# Patient Record
Sex: Female | Born: 1955
Health system: Southern US, Community
[De-identification: ages and names within clinical notes are randomized; demographics above are authoritative.]

## PROBLEM LIST (undated history)

## (undated) DIAGNOSIS — K219 Gastro-esophageal reflux disease without esophagitis: Secondary | ICD-10-CM

## (undated) DIAGNOSIS — M1710 Unilateral primary osteoarthritis, unspecified knee: Secondary | ICD-10-CM

## (undated) DIAGNOSIS — Z5189 Encounter for other specified aftercare: Secondary | ICD-10-CM

## (undated) DIAGNOSIS — E079 Disorder of thyroid, unspecified: Secondary | ICD-10-CM

## (undated) DIAGNOSIS — I10 Essential (primary) hypertension: Secondary | ICD-10-CM

## (undated) DIAGNOSIS — M179 Osteoarthritis of knee, unspecified: Secondary | ICD-10-CM

## (undated) DIAGNOSIS — R0789 Other chest pain: Secondary | ICD-10-CM

## (undated) HISTORY — PX: TOTAL ABDOMINAL HYSTERECTOMY: SHX209

## (undated) HISTORY — DX: Morbid (severe) obesity due to excess calories: E66.01

## (undated) HISTORY — DX: Unilateral primary osteoarthritis, unspecified knee: M17.10

## (undated) HISTORY — DX: Osteoarthritis of knee, unspecified: M17.9

## (undated) HISTORY — DX: Encounter for other specified aftercare: Z51.89

## (undated) HISTORY — DX: Essential (primary) hypertension: I10

## (undated) HISTORY — PX: LAPAROSCOPIC GASTRIC BANDING: SHX1100

## (undated) HISTORY — DX: Other chest pain: R07.89

## (undated) HISTORY — PX: CHOLECYSTECTOMY: SHX55

## (undated) HISTORY — PX: ROTATOR CUFF REPAIR: SHX139

## (undated) HISTORY — PX: GASTRIC BYPASS: SHX52

## (undated) HISTORY — DX: Disorder of thyroid, unspecified: E07.9

## (undated) HISTORY — DX: Gastro-esophageal reflux disease without esophagitis: K21.9

---

## 1998-05-26 ENCOUNTER — Observation Stay (HOSPITAL_COMMUNITY): Admission: EM | Admit: 1998-05-26 | Discharge: 1998-05-27 | Payer: Self-pay | Admitting: Emergency Medicine

## 2000-09-08 ENCOUNTER — Inpatient Hospital Stay (HOSPITAL_COMMUNITY): Admission: EM | Admit: 2000-09-08 | Discharge: 2000-09-19 | Payer: Self-pay | Admitting: *Deleted

## 2000-09-09 ENCOUNTER — Encounter: Payer: Self-pay | Admitting: Surgery

## 2000-09-10 ENCOUNTER — Encounter: Payer: Self-pay | Admitting: Surgery

## 2000-09-11 ENCOUNTER — Encounter: Payer: Self-pay | Admitting: Surgery

## 2000-09-13 ENCOUNTER — Encounter: Payer: Self-pay | Admitting: Surgery

## 2003-07-30 ENCOUNTER — Observation Stay (HOSPITAL_COMMUNITY): Admission: RE | Admit: 2003-07-30 | Discharge: 2003-07-31 | Payer: Self-pay | Admitting: Specialist

## 2005-08-30 ENCOUNTER — Ambulatory Visit: Payer: Self-pay | Admitting: Internal Medicine

## 2005-09-02 ENCOUNTER — Ambulatory Visit: Payer: Self-pay | Admitting: Internal Medicine

## 2005-09-13 ENCOUNTER — Ambulatory Visit (HOSPITAL_COMMUNITY): Admission: RE | Admit: 2005-09-13 | Discharge: 2005-09-13 | Payer: Self-pay | Admitting: Internal Medicine

## 2005-09-14 ENCOUNTER — Ambulatory Visit: Payer: Self-pay | Admitting: Internal Medicine

## 2005-10-12 ENCOUNTER — Ambulatory Visit: Payer: Self-pay | Admitting: Internal Medicine

## 2005-12-24 ENCOUNTER — Emergency Department (HOSPITAL_COMMUNITY): Admission: EM | Admit: 2005-12-24 | Discharge: 2005-12-24 | Payer: Self-pay | Admitting: Emergency Medicine

## 2005-12-30 ENCOUNTER — Ambulatory Visit (HOSPITAL_COMMUNITY): Admission: RE | Admit: 2005-12-30 | Discharge: 2005-12-30 | Payer: Self-pay | Admitting: Internal Medicine

## 2006-01-04 ENCOUNTER — Ambulatory Visit: Payer: Self-pay | Admitting: Internal Medicine

## 2006-08-10 ENCOUNTER — Ambulatory Visit: Payer: Self-pay | Admitting: Internal Medicine

## 2006-12-09 IMAGING — MG MM DIGITAL SCREENING BILAT
9 of 10 series · 9 of 10 positions shown · non-contrast
Comparison: none

Bilateral CC and MLO view(s) were taken.

SCREENING MAMMOGRAM:
There is a fibrofatty pattern.  No masses or malignant type calcifications are identified.  
Compared with prior studies.

[R CC (1 of 3)]
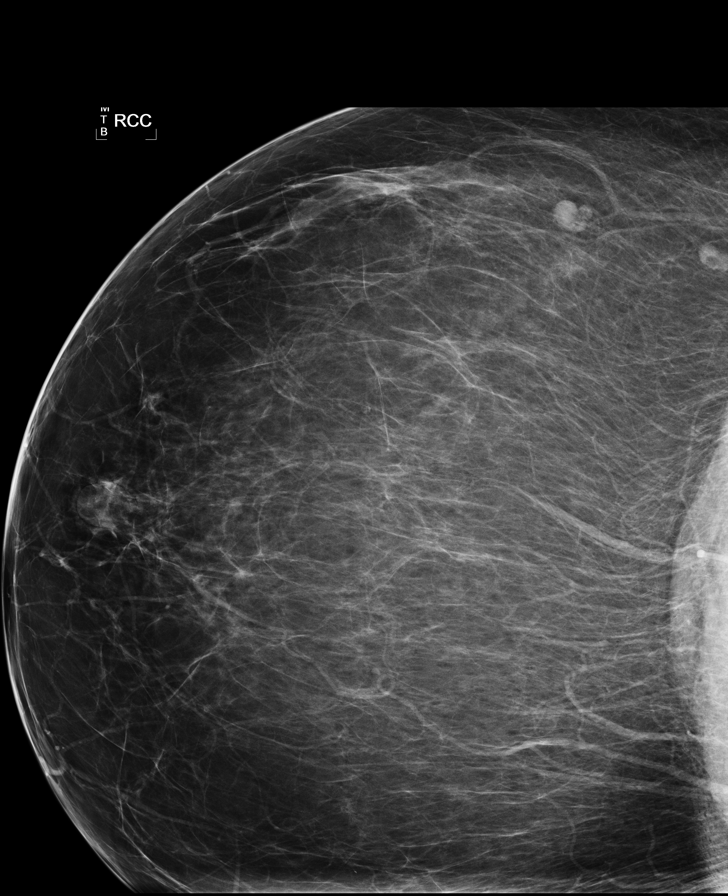

[R MLO]
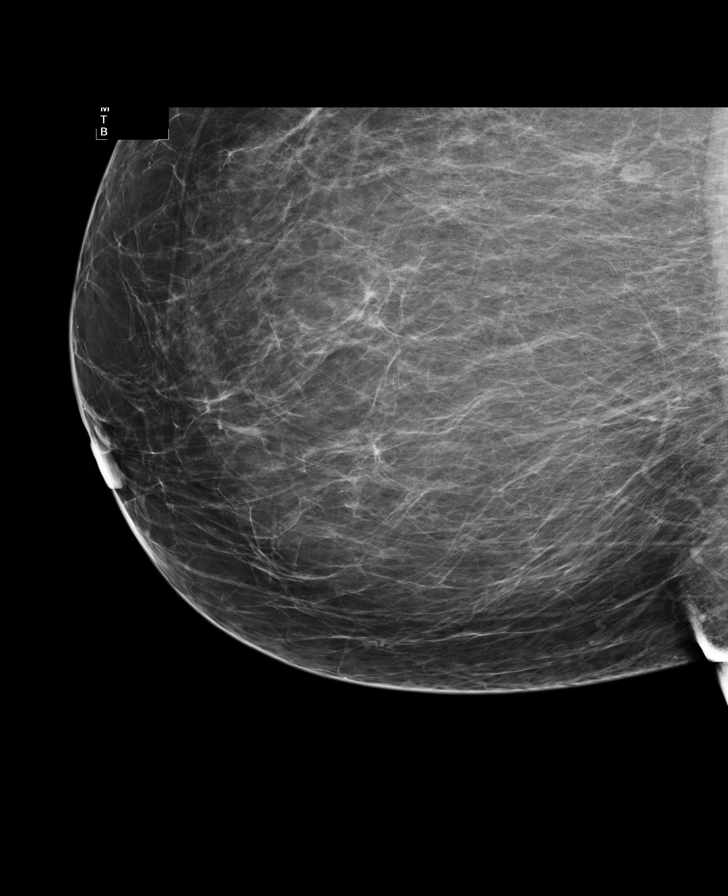

[L CC (1 of 3)]
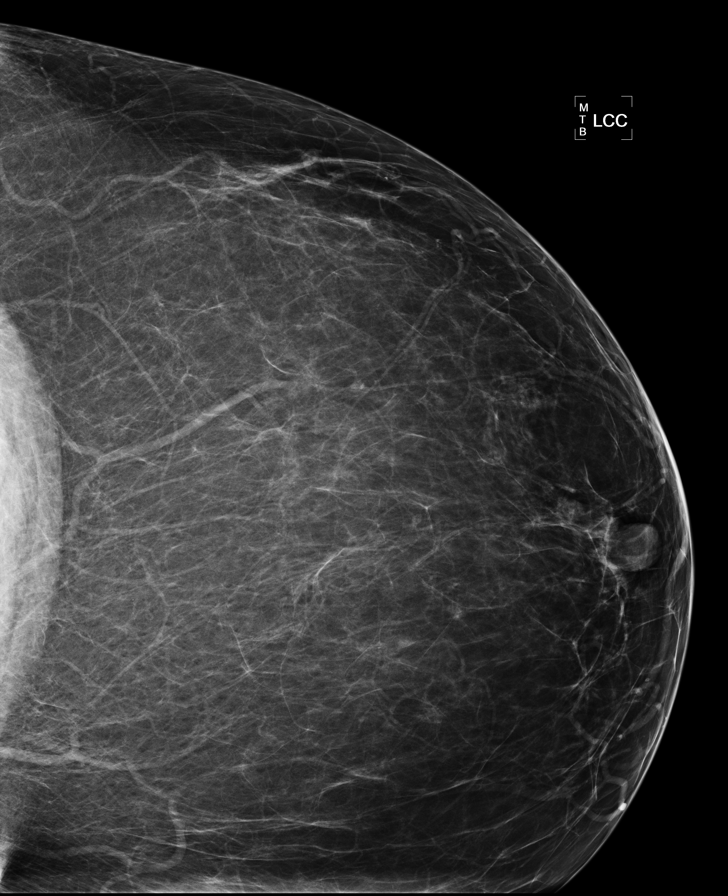

[L MLO (1 of 2)]
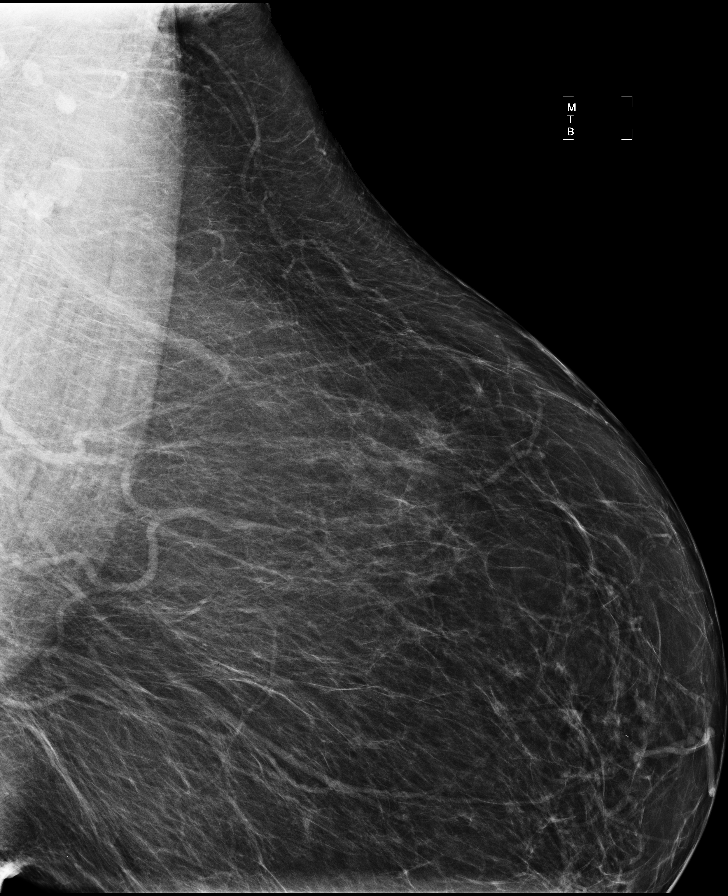

[R CC (2 of 3)]
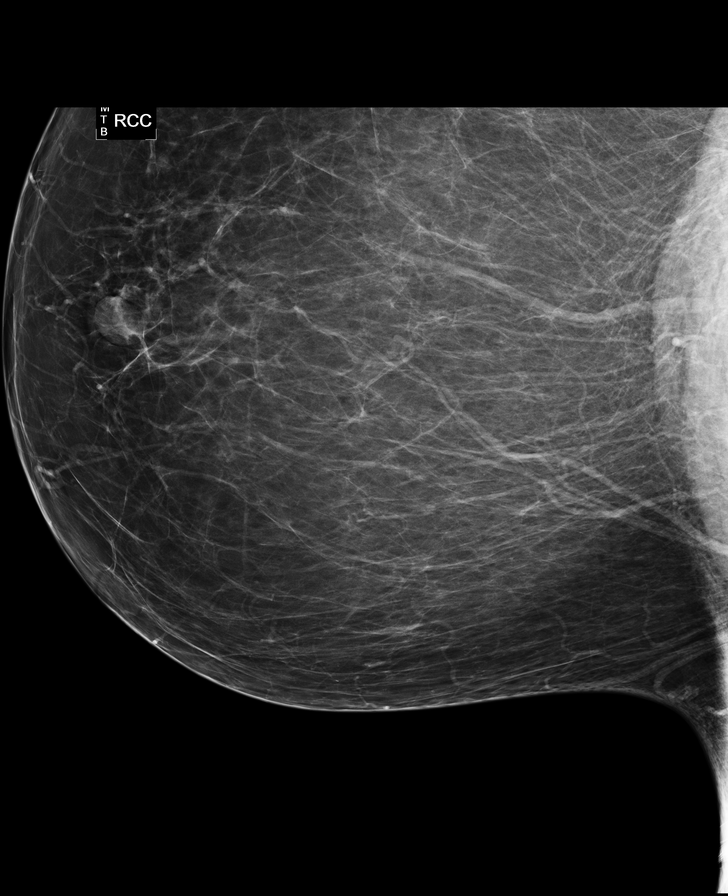

[R CC (3 of 3)]
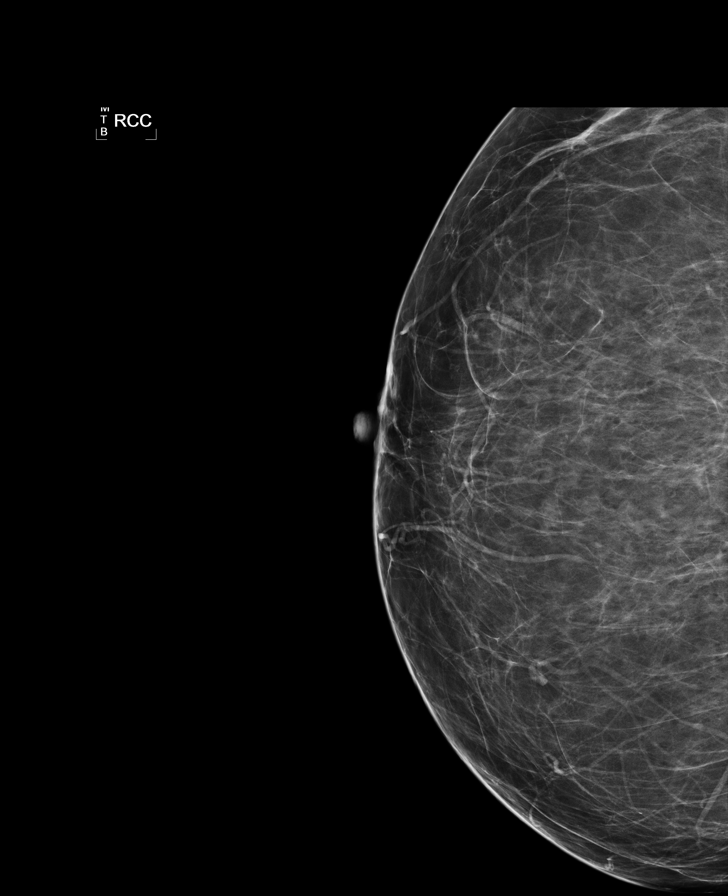

[L CC (2 of 3)]
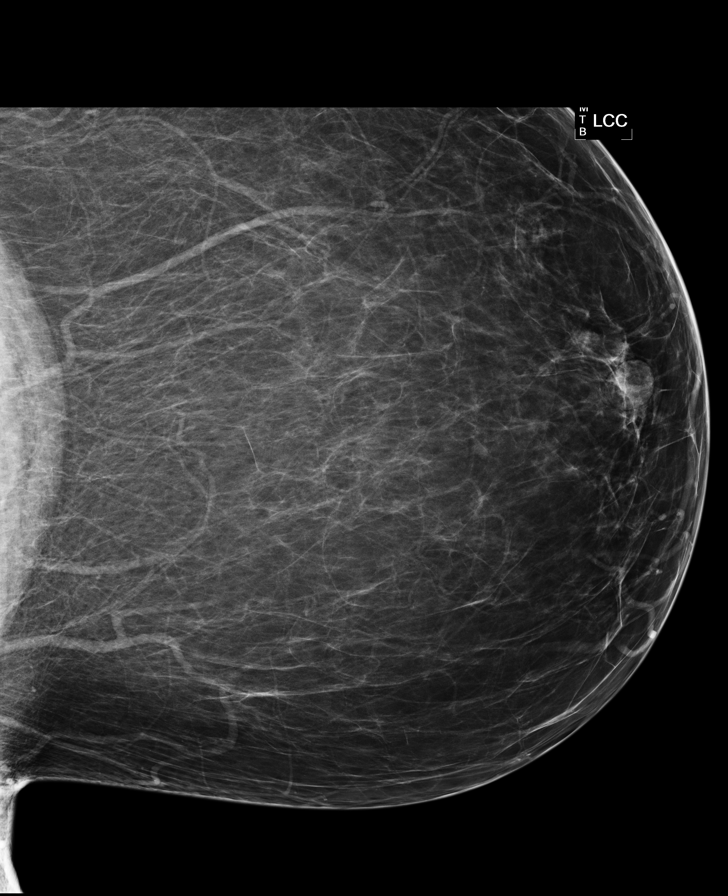

[L CC (3 of 3)]
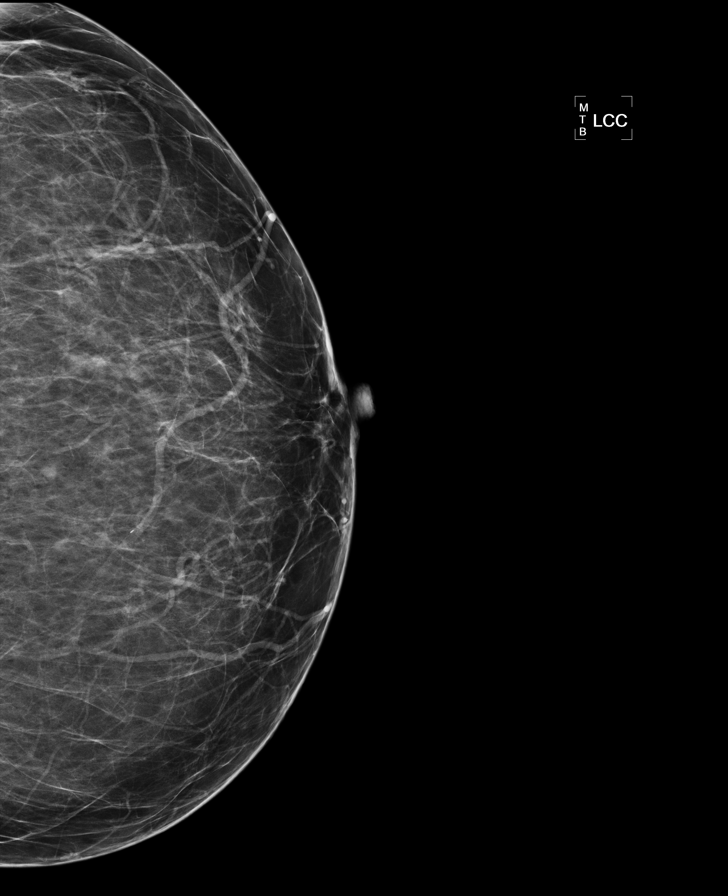

[L MLO (2 of 2)]
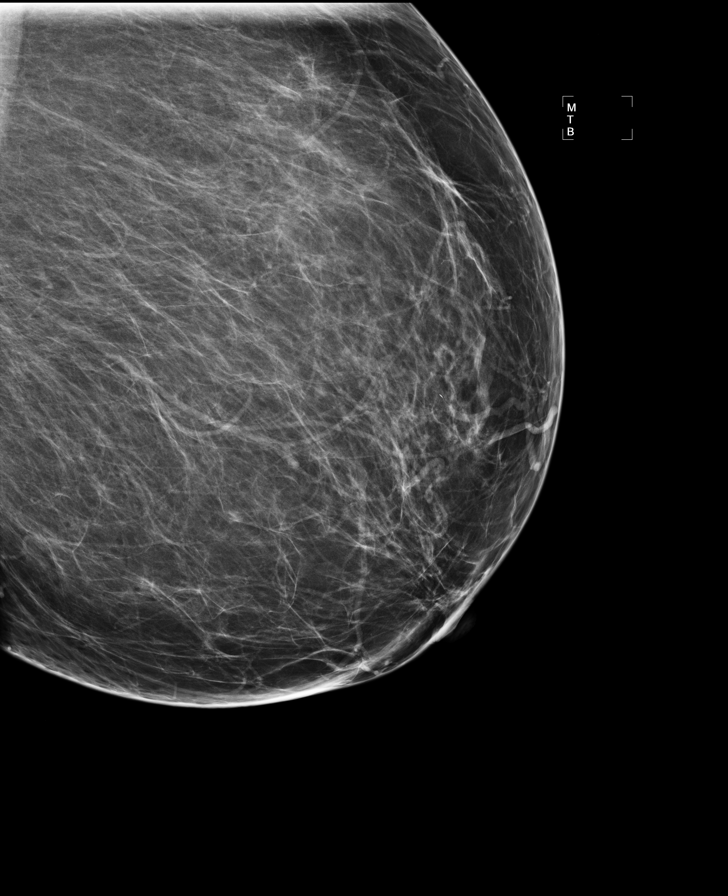

[9 of 10 positions shown; findings below may reference images not displayed]

IMPRESSION: No specific mammographic evidence of malignancy.  Next screening mammogram is recommended in one 
year.

ASSESSMENT: Negative - BI-RADS 1

Screening mammogram in 1 year.

## 2007-07-06 ENCOUNTER — Ambulatory Visit: Payer: Self-pay | Admitting: Gastroenterology

## 2007-07-10 LAB — HM COLONOSCOPY: HM Colonoscopy: ABNORMAL

## 2007-07-20 ENCOUNTER — Ambulatory Visit: Payer: Self-pay | Admitting: Gastroenterology

## 2007-07-20 ENCOUNTER — Encounter: Payer: Self-pay | Admitting: Gastroenterology

## 2007-07-31 LAB — CONVERTED CEMR LAB: Pap Smear: NORMAL

## 2007-08-07 ENCOUNTER — Encounter: Payer: Self-pay | Admitting: *Deleted

## 2007-08-07 DIAGNOSIS — M1710 Unilateral primary osteoarthritis, unspecified knee: Secondary | ICD-10-CM | POA: Insufficient documentation

## 2007-08-07 DIAGNOSIS — M179 Osteoarthritis of knee, unspecified: Secondary | ICD-10-CM | POA: Insufficient documentation

## 2007-08-07 DIAGNOSIS — Z9089 Acquired absence of other organs: Secondary | ICD-10-CM | POA: Insufficient documentation

## 2007-08-07 DIAGNOSIS — Z9079 Acquired absence of other genital organ(s): Secondary | ICD-10-CM | POA: Insufficient documentation

## 2007-08-08 ENCOUNTER — Ambulatory Visit: Payer: Self-pay | Admitting: Internal Medicine

## 2007-08-22 ENCOUNTER — Encounter: Payer: Self-pay | Admitting: Internal Medicine

## 2007-08-22 ENCOUNTER — Ambulatory Visit: Payer: Self-pay

## 2007-09-05 ENCOUNTER — Ambulatory Visit: Payer: Self-pay | Admitting: Internal Medicine

## 2007-09-05 DIAGNOSIS — K219 Gastro-esophageal reflux disease without esophagitis: Secondary | ICD-10-CM | POA: Insufficient documentation

## 2007-09-05 DIAGNOSIS — I10 Essential (primary) hypertension: Secondary | ICD-10-CM | POA: Insufficient documentation

## 2007-09-05 DIAGNOSIS — R7309 Other abnormal glucose: Secondary | ICD-10-CM | POA: Insufficient documentation

## 2007-09-05 DIAGNOSIS — R0789 Other chest pain: Secondary | ICD-10-CM | POA: Insufficient documentation

## 2007-09-07 LAB — CONVERTED CEMR LAB
BUN: 9 mg/dL (ref 6–23)
GFR calc Af Amer: 113 mL/min
GFR calc non Af Amer: 94 mL/min
Glucose, Bld: 85 mg/dL (ref 70–99)

## 2007-09-12 ENCOUNTER — Telehealth: Payer: Self-pay | Admitting: Internal Medicine

## 2007-12-13 ENCOUNTER — Ambulatory Visit: Payer: Self-pay | Admitting: Internal Medicine

## 2007-12-13 DIAGNOSIS — Z96659 Presence of unspecified artificial knee joint: Secondary | ICD-10-CM | POA: Insufficient documentation

## 2007-12-13 DIAGNOSIS — N898 Other specified noninflammatory disorders of vagina: Secondary | ICD-10-CM | POA: Insufficient documentation

## 2007-12-14 ENCOUNTER — Telehealth: Payer: Self-pay | Admitting: Internal Medicine

## 2008-01-25 ENCOUNTER — Encounter: Payer: Self-pay | Admitting: Internal Medicine

## 2008-12-15 ENCOUNTER — Ambulatory Visit: Payer: Self-pay | Admitting: Internal Medicine

## 2008-12-15 LAB — CONVERTED CEMR LAB
ALT: 21 units/L (ref 0–35)
AST: 22 units/L (ref 0–37)
BUN: 18 mg/dL (ref 6–23)
Basophils Relative: 0 % (ref 0.0–3.0)
CO2: 29 meq/L (ref 19–32)
GFR calc non Af Amer: 70 mL/min
Glucose, Bld: 88 mg/dL (ref 70–99)
Hemoglobin: 12.9 g/dL (ref 12.0–15.0)
Hgb A1c MFr Bld: 6 % (ref 4.6–6.0)
MCHC: 34.3 g/dL (ref 30.0–36.0)
MCV: 83.7 fL (ref 78.0–100.0)
Monocytes Absolute: 0.3 10*3/uL (ref 0.1–1.0)
Monocytes Relative: 2.9 % — ABNORMAL LOW (ref 3.0–12.0)
Neutro Abs: 7.7 10*3/uL (ref 1.4–7.7)
Neutrophils Relative %: 77.6 % — ABNORMAL HIGH (ref 43.0–77.0)
Platelets: 269 10*3/uL (ref 150–400)
Sodium: 142 meq/L (ref 135–145)
Total Bilirubin: 0.4 mg/dL (ref 0.3–1.2)
Total Protein: 7.3 g/dL (ref 6.0–8.3)

## 2008-12-17 ENCOUNTER — Encounter: Payer: Self-pay | Admitting: Internal Medicine

## 2009-02-10 ENCOUNTER — Telehealth: Payer: Self-pay | Admitting: Internal Medicine

## 2009-02-11 ENCOUNTER — Telehealth: Payer: Self-pay | Admitting: Internal Medicine

## 2009-08-07 ENCOUNTER — Ambulatory Visit: Payer: Self-pay | Admitting: Internal Medicine

## 2009-08-07 DIAGNOSIS — J069 Acute upper respiratory infection, unspecified: Secondary | ICD-10-CM | POA: Insufficient documentation

## 2009-11-16 ENCOUNTER — Ambulatory Visit: Payer: Self-pay | Admitting: Internal Medicine

## 2009-11-16 ENCOUNTER — Ambulatory Visit: Payer: Self-pay | Admitting: Diagnostic Radiology

## 2009-11-16 ENCOUNTER — Ambulatory Visit (HOSPITAL_BASED_OUTPATIENT_CLINIC_OR_DEPARTMENT_OTHER): Admission: RE | Admit: 2009-11-16 | Discharge: 2009-11-16 | Payer: Self-pay | Admitting: Internal Medicine

## 2009-11-16 DIAGNOSIS — L989 Disorder of the skin and subcutaneous tissue, unspecified: Secondary | ICD-10-CM | POA: Insufficient documentation

## 2009-11-16 LAB — HM MAMMOGRAPHY: HM Mammogram: NORMAL

## 2010-01-11 ENCOUNTER — Ambulatory Visit: Payer: Self-pay | Admitting: Internal Medicine

## 2010-01-11 LAB — CONVERTED CEMR LAB
BUN: 19 mg/dL (ref 6–23)
Chloride: 105 meq/L (ref 96–112)
Creatinine, Ser: 0.73 mg/dL (ref 0.40–1.20)

## 2010-01-14 ENCOUNTER — Ambulatory Visit: Payer: Self-pay | Admitting: Internal Medicine

## 2010-04-07 ENCOUNTER — Ambulatory Visit: Payer: Self-pay | Admitting: Internal Medicine

## 2010-04-07 DIAGNOSIS — M542 Cervicalgia: Secondary | ICD-10-CM | POA: Insufficient documentation

## 2010-04-14 ENCOUNTER — Telehealth: Payer: Self-pay | Admitting: Internal Medicine

## 2010-06-10 ENCOUNTER — Telehealth: Payer: Self-pay | Admitting: Internal Medicine

## 2010-07-19 ENCOUNTER — Ambulatory Visit: Payer: Self-pay | Admitting: Internal Medicine

## 2010-07-19 ENCOUNTER — Telehealth: Payer: Self-pay | Admitting: Internal Medicine

## 2010-07-20 ENCOUNTER — Encounter: Payer: Self-pay | Admitting: Internal Medicine

## 2010-07-23 ENCOUNTER — Encounter: Payer: Self-pay | Admitting: Internal Medicine

## 2010-07-31 HISTORY — PX: COSMETIC SURGERY: SHX468

## 2010-08-05 ENCOUNTER — Telehealth: Payer: Self-pay | Admitting: Internal Medicine

## 2010-08-06 ENCOUNTER — Encounter: Payer: Self-pay | Admitting: Internal Medicine

## 2010-11-30 ENCOUNTER — Encounter: Payer: Self-pay | Admitting: Internal Medicine

## 2010-12-02 NOTE — Progress Notes (Signed)
Summary:  Unresolved Neck Pain  Phone Note Call from Patient Call back at Home Phone [phone]   Caller: Patient Reason for Call: Talk to Nurse Summary of Call: Pt states neck pain in no better Initial call taken by: Lannette Donath,  April 14, 2010 9:51 AM  Follow-up for Phone Call        call  returned to patient regrading her neck pain, she states that she has neck pain is worse at night, and when it is tilted , or when she attempts to read something. She states as long as she is holding her head straight she is not bothered by the pain. Follow-up by: Glendell Docker CMA,  April 14, 2010 11:41 AM  Additional Follow-up for Phone Call Additional follow up Details #1::        use tramadol as directed.  If persistent pain, I suggest OV.  Pt can take 500 mg of tylenol and tramadol together Additional Follow-up by: D. Thomos Lemons DO,  April 14, 2010 3:58 PM    Additional Follow-up for Phone Call Additional follow up Details #2::    patient advised per Dr Artist Pais instructions. She states that she has been taking some of her sisters pain medication and her neck pain is not really going away. She also states that she has an appointment with orthopaedics for her Knee and will try to get a appointment for evaluation for her neck ,and until then she will take the medication that Dr Artist Pais has sent to the pharmacy Follow-up by: Glendell Docker CMA,  April 14, 2010 4:08 PM  New/Updated Medications: TRAMADOL HCL 50 MG TABS (TRAMADOL HCL) one by mouth two times a day as needed for neck pain Prescriptions: TRAMADOL HCL 50 MG TABS (TRAMADOL HCL) one by mouth two times a day as needed for neck pain  #30 x 0   Entered and Authorized by:   D. Thomos Lemons DO   Signed by:   D. Thomos Lemons DO on 04/14/2010   Method used:   Electronically to        [facility] (retail)       [address]       [place], [place]  [id number]       Ph: [phone]       Fax: [phone]   RxID:    [id number]

## 2010-12-02 NOTE — Progress Notes (Signed)
Summary: Medical Clearance Form  Phone Note From Other Clinic Call back at [phone]   Caller: Provider Summary of Call: Ann from Dr Diaz's office wants to know if we received the medical clearance form Initial call taken by: Lannette Donath,  August 05, 2010 1:06 PM  Follow-up for Phone Call        call returned to [facility] she was informed surgical clearance form has been received. Thurston Hole states patient is schedule to have procedure done next week under local anesthesia. She was informed once the form has been signed off on it would be faxed to there office.  Follow-up by: Glendell Docker CMA,  August 05, 2010 2:23 PM

## 2010-12-02 NOTE — Medication Information (Signed)
Summary: Prior Authorization for Benicar/[employer]  Prior Authorization for Benicar/[employer]   Imported By: Lanelle Bal 07/28/2010 14:15:31  _____________________________________________________________________  External Attachment:    Type:   Image     Comment:   External Document

## 2010-12-02 NOTE — Assessment & Plan Note (Signed)
Summary: 2 mo. f/u - jr   Vital Signs:  Patient profile:   55 year old female Weight:      275.50 pounds BMI:     44.63 O2 Sat:      100 % on Room air Temp:     98.3 degrees F oral Pulse rate:   76 / minute Pulse rhythm:   regular Resp:     20 per minute BP sitting:   120 / 80  (right arm) Cuff size:   large  Vitals Entered By: Glendell Docker CMA (January 14, 2010 9:54 AM)  O2 Flow:  Room air CC: Rm 2-  2 MonthFollow up  Comments no concerns   Primary Care Provider:  DThomos Lemons DO  CC:  Rm 2-  2 MonthFollow up .  History of Present Illness:  Hypertension Follow-Up      This is a 55 year old woman who presents for Hypertension follow-up.  The patient denies lightheadedness, urinary frequency, headaches, and edema.  The patient denies the following associated symptoms: chest pain.  Compliance with medications (by patient report) has been near 100%.  The patient reports that dietary compliance has been fair.    Allergies: 1)  ! Vicodin  Past History:  Past Medical History: Hypertension Knee Osteoarhritis Morbid Obesity  GERD    Atpical chest pain      Past Surgical History: CHOLECYSTECTOMY, HX OF (ICD-V45.79)  TOTAL HYSTERECTOMY AND BILATERAL SALPINGOOPHERECTOMY, HX OF (ICD-V45.77) History of Gastric Bypass.        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   Neck:  No deformities, masses, or tenderness noted.no carotid bruits.   Lungs:  normal respiratory effort and normal breath sounds.   Heart:  normal rate, regular rhythm, and no gallop.   Extremities:  No lower extremity edema    Impression & Recommendations:  Problem # 1:  HYPERTENSION (ICD-401.9) Assessment Improved good response to benicar.  Maintain current medication regimen.  Her updated medication list for this problem includes:    Benicar 20 Mg  Tabs (Olmesartan medoxomil) ..... One by mouth qd  BP today: 120/80 Prior BP: 150/80 (11/16/2009)  Labs Reviewed: K+: 4.9 (01/11/2010) Creat: : 0.73 (01/11/2010)   Chol: 203 (12/15/2008)   HDL: 83.6 (12/15/2008)   LDL: DEL (12/15/2008)   TG: 65 (12/15/2008)  Complete Medication List: 1)  Nexium 40 Mg Cpdr (Esomeprazole magnesium) .... One by mouth once daily ac 2)  Low-dose Aspirin 81 Mg Tabs (Aspirin) .... One by mouth once daily 3)  Benicar 20 Mg Tabs (Olmesartan medoxomil) .... One by mouth qd 4)  Hydroquinone 4 % Crea (Hydroquinone) .... Apply to affected area two times a day  Patient Instructions: 1)  Please schedule a follow-up appointment in 6 months. Prescriptions: BENICAR 20 MG TABS (OLMESARTAN MEDOXOMIL) one by mouth qd  #90 x 1   Entered and Authorized by:   D. Thomos Lemons DO   Signed by:   D. Thomos Lemons DO on 01/14/2010   Method used:   Electronically to        [facility] (retail)       [address]       [place], [place]  [id number]       Ph: [phone]       Fax: [phone]   RxID:   [id number]  Current Allergies (reviewed today): ! VICODIN

## 2010-12-02 NOTE — Assessment & Plan Note (Signed)
Summary: 3 MONTH FOLLOW UP/MHF   Vital Signs:  Patient profile:   55 year old female Weight:      286 pounds BMI:     46.33 O2 Sat:      100 % on Room air Pulse rate:   66 / minute Pulse rhythm:   regular Resp:     18 per minute BP sitting:   150 / 80  (left arm) Cuff size:   large  Vitals Entered By: Glendell Docker CMA (November 16, 2009 10:17 AM)  O2 Flow:  Room air  Primary Care Provider:  D. Thomos Lemons DO  CC:  3  Month Follow up .  History of Present Illness: 3 Month Follow up patient states the Diovan makes her eyes red-no taking on a regular basis- onset a week after starting medication, she states she stopped taking medication to make sure that was the cause. When she re-started her eyes started to become red about one half hour after starting medication.  Pt has been seeing derm due to hyperpigmentation after insect bites on legs.  use cream that is somewhat helpful.  she request refill  Allergies: 1)  ! Vicodin  Past History:  Past Medical History: Hypertension Knee Osteoarhritis Morbid Obesity  GERD   Atpical chest pain      Past Surgical History: CHOLECYSTECTOMY, HX OF (ICD-V45.79)  TOTAL HYSTERECTOMY AND BILATERAL SALPINGOOPHERECTOMY, HX OF (ICD-V45.77) History of Gastric Bypass.       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   Lungs:  normal respiratory effort and normal breath sounds.   Heart:  normal rate, regular rhythm, and no gallop.   Msk:  scatterd hyperpigmentation of lower ext   Impression & Recommendations:  Problem # 1:  HYPERTENSION (ICD-401.9) Pt unable to tolerate Diovan due to complaints of eye redness.  She  has tolerated benicar w/o problems.   restart benicar.   follow low salt diet.  Her updated medication list for this problem includes:    Benicar 20 Mg Tabs (Olmesartan medoxomil) ..... One by mouth qd  BP today: 150/80 Prior BP: 130/90 (08/07/2009)  Labs Reviewed: K+: 4.3 (12/15/2008) Creat: : 0.9 (12/15/2008)   Chol: 203 (12/15/2008)   HDL: 83.6 (12/15/2008)   LDL: DEL (12/15/2008)   TG: 65 (12/15/2008)  Problem # 2:  SKIN DISORDER (ICD-709.9) residual hyperpigmentation on lower ext after insect bites.  her derm has been prescribing cream.  refilled.  Complete Medication List: 1)  Nexium 40 Mg Cpdr (Esomeprazole magnesium) .... One by mouth once daily ac 2)  Low-dose Aspirin 81 Mg Tabs (Aspirin) .... One by mouth once daily 3)  Benicar 20 Mg Tabs (Olmesartan medoxomil) .... One by mouth qd 4)  Hydroquinone 4 % Crea (Hydroquinone) .... Apply to affected area two times a day  Other Orders: Mammogram (Screening) (Mammo)  Patient Instructions: 1)  Please schedule a follow-up appointment in 2 months. 2)  Decrease salt / sodium intake 3)  BMP prior to visit, ICD-9: 401.9 4)  Please return for lab work one (1) week before your next appointment.  Prescriptions: BENICAR 20 MG TABS (OLMESARTAN MEDOXOMIL) one by mouth qd  #90 x 1   Entered and Authorized  by:   Dondra Spry DO   Signed by:   D. Thomos Lemons DO on 11/16/2009   Method used:   Print then Give to Patient   RxID:   [id number] HYDROQUINONE 4 % CREA (HYDROQUINONE) apply to affected area two times a day  #1 oz x 2   Entered and Authorized by:   D. Thomos Lemons DO   Signed by:   D. Thomos Lemons DO on 11/16/2009   Method used:   Electronically to        [facility] (retail)       [address]       [place], [place]  [address]       Ph: [phone]       Fax: [phone]   RxID:   [id number]   Current Allergies (reviewed today): ! VICODIN

## 2010-12-02 NOTE — Progress Notes (Signed)
Summary: HYDROQUINONE Refill  Phone Note Refill Request Message from:  Fax from Pharmacy on June 10, 2010 2:11 PM  Refills Requested: Medication #1:  HYDROQUINONE 4 % CREA apply to affected area two times a day   Dosage confirmed as above?Dosage Confirmed   Brand Name Necessary? No   Supply Requested: 1 month   Last Refilled: 05/13/2010  Method Requested: Electronic Next Appointment Scheduled: 07/19/2010 Dr Artist Pais @ 9:15am Initial call taken by: Glendell Docker CMA,  June 10, 2010 2:11 PM  Follow-up for Phone Call        refill x 1 only.  if she is still having skin issues,  she should see dermatologist Follow-up by: D. Thomos Lemons DO,  June 14, 2010 9:35 AM  Additional Follow-up for Phone Call Additional follow up Details #1::        call placed to patient at [phone], she has been advised per Dr Artist Pais instructions Additional Follow-up by: Glendell Docker CMA,  June 14, 2010 10:11 AM    Prescriptions: HYDROQUINONE 4 % CREA (HYDROQUINONE) apply to affected area two times a day  #1 oz x 0   Entered by:   Glendell Docker CMA   Authorized by:   D. Thomos Lemons DO   Signed by:   Glendell Docker CMA on 06/14/2010   Method used:   Electronically to        [facility] (retail)       [address]       [place], [place]  [id number]       Ph: [phone]       Fax: [phone]   RxID:   [id number]

## 2010-12-02 NOTE — Assessment & Plan Note (Signed)
Summary: 6 mo. f/u - jr   Vital Signs:  Patient profile:   55 year old female Height:      66 inches (167.64 cm) Weight:      264 pounds (120.00 kg) BMI:     42.76 O2 Sat:      99 % on Room air Pulse rate:   81 / minute BP sitting:   150 / 74  (right arm) Cuff size:   large  Vitals Entered By: Brenton Grills MA (July 19, 2010 9:34 AM)  O2 Flow:  Room air CC: 6 month F/U/pt is no longer taking Nexium, Hydroquinone/aj Is Patient Diabetic? Yes   Primary Care Provider:  Dondra Spry DO  CC:  6 month F/U/pt is no longer taking Nexium and Hydroquinone/aj.  History of Present Illness: 55 y/o female for f/u  int hx: neck pain - still having neck pain.  she was seen by [facility]. her symptoms attibuted to OA of c spine she was prescribed celebrex. she takes appro 2 x per week  Hypertension Follow-Up      This is a 55 year old woman who presents for Hypertension follow-up.  The patient denies lightheadedness.  The patient denies the following associated symptoms: chest pain and chest pressure.  The patient reports no exercise.      Current Medications (verified): 1)  Nexium 40 Mg  Cpdr (Esomeprazole Magnesium) .... One By Mouth Once Daily Ac 2)  Low-Dose Aspirin 81 Mg  Tabs (Aspirin) .... One By Mouth Once Daily 3)  Benicar 20 Mg Tabs (Olmesartan Medoxomil) .... One By Mouth Qd 4)  Hydroquinone 4 % Crea (Hydroquinone) .... Apply To Affected Area Two Times A Day 5)  Tramadol Hcl 50 Mg Tabs (Tramadol Hcl) .... One By Mouth Two Times A Day As Needed For Neck Pain  Allergies (verified): 1)  ! Vicodin  Past History:  Past Medical History: Hypertension Knee Osteoarhritis  Morbid Obesity  GERD    Atpical chest pain       Past Surgical History: CHOLECYSTECTOMY, HX OF (ICD-V45.79)  TOTAL HYSTERECTOMY AND BILATERAL SALPINGOOPHERECTOMY, HX OF (ICD-V45.77) History of Gastric Bypass.          Physical Exam  General:  alert, well-developed, and well-nourished.   Neck:   supple.   Lungs:  normal respiratory effort and normal breath sounds.   Heart:  normal rate, regular rhythm, and no gallop.     Impression & Recommendations:  Problem # 1:  HYPERTENSION (ICD-401.9) Assessment Deteriorated encouraged med compliance and low salt diet  Her updated medication list for this problem includes:    Benicar 20 Mg Tabs (Olmesartan medoxomil) ..... One by mouth qd  Future Orders: T-Basic Metabolic Panel [id number]) ... 01/03/2011 T-Lipid Profile [id number]) ... 01/03/2011  BP today: 150/74 Prior BP: 130/70 (04/07/2010)  Labs Reviewed: K+: 4.9 (01/11/2010) Creat: : 0.73 (01/11/2010)   Chol: 203 (12/15/2008)   HDL: 83.6 (12/15/2008)   LDL: DEL (12/15/2008)   TG: 65 (12/15/2008)  Problem # 2:  NECK PAIN, LEFT (ICD-723.1) some improvement with NSAIDS.  she was seen by [facility].  symptoms attributed to OA of C spine. we discussed risks for chronic celebrex use.   monitor kidney function  Her updated medication list for this problem includes:    Low-dose Aspirin 81 Mg Tabs (Aspirin) ..... One by mouth once daily    Tramadol Hcl 50 Mg Tabs (Tramadol hcl) ..... One by mouth two times a day as needed for neck pain  Complete Medication  List: 1)  Low-dose Aspirin 81 Mg Tabs (Aspirin) .... One by mouth once daily 2)  Benicar 20 Mg Tabs (Olmesartan medoxomil) .... One by mouth qd 3)  Hydroquinone 4 % Crea (Hydroquinone) .... Apply to affected area two times a day 4)  Tramadol Hcl 50 Mg Tabs (Tramadol hcl) .... One by mouth two times a day as needed for neck pain  Other Orders: Flu Vaccine 80yrs + ([address]) Admin 1st Vaccine ([address]) Future Orders: T- Hemoglobin A1C ([id number]) ... 01/03/2011  Patient Instructions: 1)  Please schedule a follow-up appointment in 6 months. 2)  BMP prior to visit, ICD-9:  401.9 3)  HbgA1C prior to visit, ICD-9:  790.29 4)  Lipid Panel prior to visit, ICD-9:  401.9 5)  Please return for lab work one (1) week before your next  appointment.  Prescriptions: BENICAR 20 MG TABS (OLMESARTAN MEDOXOMIL) one by mouth qd  #90 x 1   Entered and Authorized by:   D. Thomos Lemons DO   Signed by:   D. Thomos Lemons DO on 07/19/2010   Method used:   Electronically to        [facility] (retail)       [address]       [place], [place]  [id number]       Ph: [phone]       Fax: [phone]   RxID:   [id number]    Immunizations Administered:  Influenza Vaccine # 1:    Vaccine Type: Fluvax 3+    Site: left deltoid    Mfr: [employer]Smith[employer]    Dose: 0.5 ml    Route: IM    Given by: Mervin Kung CMA (AAMA)    Exp. Date: 04/30/2011    Lot #: [id number]  Flu Vaccine Consent Questions:    Do you have a history of severe allergic reactions to this vaccine? no    Any prior history of allergic reactions to egg and/or gelatin? no    Do you have a sensitivity to the preservative Thimersol? no    Do you have a past history of Guillan-Barre Syndrome? no    Do you currently have an acute febrile illness? no    Have you ever had a severe reaction to latex? no    Vaccine information given and explained to patient? yes    Are you currently pregnant? no

## 2010-12-02 NOTE — Progress Notes (Signed)
Summary: prior auth for Benicar   Phone Note Outgoing Call   Call placed by: Marj Call placed to: [employer]  Summary of Call: [facility] initiated prior authorization for Benicar 20 mg tab patients BCBS # is [id number].  Glenda with [employer] will fax form to be filled out by Dr Artist Pais  Initial call taken by: Roselle Locus,  July 19, 2010 11:02 AM  Follow-up for Phone Call        form faxed to [facility] for medication alternative request Follow-up by: Glendell Docker CMA,  July 21, 2010 2:20 PM  Additional Follow-up for Phone Call Additional follow up Details #1::        approval status unknown. Additional Follow-up by: Glendell Docker CMA,  July 30, 2010 11:36 AM

## 2010-12-02 NOTE — Medication Information (Signed)
Summary: Approval for Benicar/[employer]  Approval for Benicar/[employer]   Imported By: Lanelle Bal 08/03/2010 10:58:34  _____________________________________________________________________  External Attachment:    Type:   Image     Comment:   External Document

## 2010-12-02 NOTE — Letter (Signed)
Summary: Surgical Clearance/Lifestyle Lift  Surgical Clearance/Lifestyle Lift   Imported By: Lanelle Bal 08/12/2010 12:00:51  _____________________________________________________________________  External Attachment:    Type:   Image     Comment:   External Document

## 2010-12-02 NOTE — Assessment & Plan Note (Signed)
Summary: neck pain/mhf   Vital Signs:  Patient profile:   55 year old female Height:      66 inches Weight:      269 pounds BMI:     43.57 O2 Sat:      100 % on Room air Temp:     97.9 degrees F oral Pulse rate:   70 / minute Pulse rhythm:   regular Resp:     18 per minute BP sitting:   130 / 70  (right arm) Cuff size:   large  Vitals Entered By: Glendell Docker CMA (April 07, 2010 10:11 AM)  O2 Flow:  Room air CC: Rm 3- Neck Pain, Neck pain Is Patient Diabetic? No Pain Assessment Patient in pain? yes     Location: back Intensity: 3 Type: aching Onset of pain  Constant Comments c/o neck pain for the past several month   Primary Care Provider:  DThomos Lemons DO  CC:  Rm 3- Neck Pain and Neck pain.  History of Present Illness:  Neck Pain      This is a 55 year old woman who presents with Neck pain.  The patient reports left neck pain.  The patient denies the following associated symptoms: numbness, weakness, and tingling/parasthesias.  The pain is described as intermittent.  no injury or trauma  Allergies: 1)  ! Vicodin  Past History:  Past Medical History: Hypertension Knee Osteoarhritis  Morbid Obesity  GERD    Atpical chest pain      Past Surgical History: CHOLECYSTECTOMY, HX OF (ICD-V45.79)  TOTAL HYSTERECTOMY AND BILATERAL SALPINGOOPHERECTOMY, HX OF (ICD-V45.77) History of Gastric Bypass.         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   Head:  normocephalic and atraumatic.   Neck:  supple.  mild discomfort with side bending, flexionand rotatio Lungs:  normal respiratory effort and normal breath sounds.   Heart:  normal rate, regular rhythm, and no gallop.   Neurologic:  cranial nerves II-XII intact, gait normal, and DTRs symmetrical and normal.     Impression & Recommendations:  Problem # 1:  NECK PAIN,  LEFT (ICD-723.1) Probable neck strain.  tx with muscle relaxers.  Patient advised to call office if symptoms persist or worsen.  Her updated medication list for this problem includes:    Low-dose Aspirin 81 Mg Tabs (Aspirin) ..... One by mouth once daily    Tramadol Hcl 50 Mg Tabs (Tramadol hcl) ..... One by mouth two times a day as needed for neck pain  Complete Medication List: 1)  Nexium 40 Mg Cpdr (Esomeprazole magnesium) .... One by mouth once daily ac 2)  Low-dose Aspirin 81 Mg Tabs (Aspirin) .... One by mouth once daily 3)  Benicar 20 Mg Tabs (Olmesartan medoxomil) .... One by mouth qd 4)  Hydroquinone 4 % Crea (Hydroquinone) .... Apply to affected area two times a day 5)  Tramadol Hcl 50 Mg Tabs (Tramadol hcl) .... One by mouth two times a day as needed for neck pain  Patient Instructions: 1)  Call our office if your symptoms do not  improve or gets worse. Prescriptions: METAXALONE 800 MG TABS (METAXALONE) one by mouth three times a day as needed for neck pain  #21 x 0   Entered and Authorized by:   D. Thomos Lemons DO   Signed by:   D.  Thomos Lemons DO on 04/07/2010   Method used:   Electronically to        [facility] (retail)       [address]       [place], [place]  [id number]       Ph: [phone]       Fax: [phone]   RxID:   [id number]   Current Allergies (reviewed today): ! VICODIN

## 2011-01-19 ENCOUNTER — Encounter: Payer: Self-pay | Admitting: Internal Medicine

## 2011-01-19 ENCOUNTER — Ambulatory Visit (INDEPENDENT_AMBULATORY_CARE_PROVIDER_SITE_OTHER): Payer: BC Managed Care – PPO | Admitting: Internal Medicine

## 2011-01-19 DIAGNOSIS — I10 Essential (primary) hypertension: Secondary | ICD-10-CM

## 2011-01-19 DIAGNOSIS — R7309 Other abnormal glucose: Secondary | ICD-10-CM

## 2011-01-19 DIAGNOSIS — R7303 Prediabetes: Secondary | ICD-10-CM

## 2011-01-19 DIAGNOSIS — E669 Obesity, unspecified: Secondary | ICD-10-CM

## 2011-01-19 DIAGNOSIS — IMO0002 Reserved for concepts with insufficient information to code with codable children: Secondary | ICD-10-CM

## 2011-01-19 DIAGNOSIS — M1710 Unilateral primary osteoarthritis, unspecified knee: Secondary | ICD-10-CM

## 2011-01-19 LAB — LIPID PANEL
Cholesterol: 221 mg/dL — ABNORMAL HIGH (ref 0–200)
HDL: 87 mg/dL (ref >39)
LDL Cholesterol: 124 mg/dL — ABNORMAL HIGH (ref 0–99)
Total CHOL/HDL Ratio: 2.5 Ratio
Triglycerides: 48 mg/dL (ref <150)
VLDL: 10 mg/dL (ref 0–40)

## 2011-01-19 MED ORDER — TELMISARTAN 40 MG PO TABS: 40.0000 mg | ORAL_TABLET | Freq: Every day | ORAL | Status: DC

## 2011-01-19 NOTE — Assessment & Plan Note (Signed)
BP at goal Benicar is cost prohibitive She tried diovan but could not tolerate - experienced eye irriation  Trial of micardis 40 mg .  One month sample provided Pt advised to reports any adverse effects  Monitor BMET

## 2011-01-19 NOTE — Progress Notes (Signed)
  Subjective:    Patient ID: Gina Ryan, female    DOB: Feb 20, 1956, 55 y.o.   MRN: [id number]  HPI  55 y/o female for routine follow up Int hx:   Had plastic history - chin lift No complications  Obesity - following wt watchers Exercise limited left knee OA Still using celebrex but more sparingly  Htn - monitoring BP at home SBP 130s at home    Review of Systems  Cardiovascular: Negative for chest pain.  mild wt loss     Objective:   Physical Exam  Constitutional: She appears well-developed and well-nourished.  Cardiovascular: Normal rate, regular rhythm and normal heart sounds.   Pulmonary/Chest: Effort normal and breath sounds normal.  Musculoskeletal: She exhibits no edema.  Skin: Skin is warm and dry.          Assessment & Plan:

## 2011-01-20 ENCOUNTER — Encounter: Payer: Self-pay | Admitting: Internal Medicine

## 2011-01-20 LAB — BASIC METABOLIC PANEL WITH GFR
BUN: 22 mg/dL (ref 6–23)
Chloride: 103 mEq/L (ref 96–112)
GFR, Est African American: >60 mL/min (ref >60)
GFR, Est Non African American: >60 mL/min (ref >60)
Sodium: 142 mEq/L (ref 135–145)

## 2011-01-20 NOTE — Assessment & Plan Note (Signed)
Pt slowly losing wt on wt watcher diet Exercise somewhat limited by knee OA

## 2011-01-20 NOTE — Assessment & Plan Note (Signed)
Continue wt loss efforts Use celebrex sparingly Monitor bmet

## 2011-02-15 ENCOUNTER — Telehealth: Payer: Self-pay | Admitting: Internal Medicine

## 2011-02-15 DIAGNOSIS — K219 Gastro-esophageal reflux disease without esophagitis: Secondary | ICD-10-CM

## 2011-02-15 MED ORDER — ESOMEPRAZOLE MAGNESIUM 40 MG PO CPDR: 40.0000 mg | DELAYED_RELEASE_CAPSULE | Freq: Every day | ORAL | Status: DC

## 2011-02-15 NOTE — Telephone Encounter (Signed)
Refill- nexium dr 40mg  capsule. Take 1 capsule by mouth once daily. Qty 30. Last fill 2.24.12

## 2011-02-15 NOTE — Telephone Encounter (Signed)
Call place to patient [phone], she was asked about Nexium Rx; states that she is taking the medication. She last filled rx in February. Rx refill sent to pharmacy for 30 day supply

## 2011-03-03 ENCOUNTER — Ambulatory Visit: Payer: BC Managed Care – PPO | Admitting: Internal Medicine

## 2011-03-03 ENCOUNTER — Ambulatory Visit (INDEPENDENT_AMBULATORY_CARE_PROVIDER_SITE_OTHER): Payer: BC Managed Care – PPO | Admitting: Internal Medicine

## 2011-03-03 ENCOUNTER — Encounter: Payer: Self-pay | Admitting: Internal Medicine

## 2011-03-03 DIAGNOSIS — I10 Essential (primary) hypertension: Secondary | ICD-10-CM

## 2011-03-03 DIAGNOSIS — E8941 Symptomatic postprocedural ovarian failure: Secondary | ICD-10-CM

## 2011-03-03 MED ORDER — ESTRADIOL 0.05 MG/24HR TD PTWK: 1.0000 | MEDICATED_PATCH | TRANSDERMAL | Status: DC

## 2011-03-03 MED ORDER — TELMISARTAN 40 MG PO TABS: 40.0000 mg | ORAL_TABLET | Freq: Every day | ORAL | Status: DC

## 2011-03-03 NOTE — Progress Notes (Signed)
  Subjective:    Patient ID: Gina Ryan, female    DOB: Oct 07, 1956, 55 y.o.   MRN: [id number]  HPI 55 y/o AA female for follow up re:  HTN.  She is doing well on micardis 40 mg.  She denies dizziness or any other side effects.    Pt also complains of severe hot flashes.  Some times she is drenching in sweat. She denies hx of DVT. Negative family hx of breast cancer.  She has had total abd hysterectomy in the past.  Review of Systems negative for chest pain or shortness of breath    Past Medical History  Diagnosis Date  . Hypertension   . Morbid obesity   . GERD (gastroesophageal reflux disease)   . Osteoarthritis, knee   . Atypical chest pain     History   Social History  . Marital Status: Single    Spouse Name: N/A    Number of Children: N/A  . Years of Education: N/A   Occupational History  . [employer]   Social History Main Topics  . Smoking status: Never Smoker   . Smokeless tobacco: Not on file  . Alcohol Use: No  . Drug Use:   . Sexually Active:    Other Topics Concern  . Not on file   Social History Narrative   Lives aloneDaugher , son and 4 grand children live in the areWidowed - husband died 15 yrs ago    Past Surgical History  Procedure Date  . Cholecystectomy   . Total abdominal hysterectomy   . Gastric bypass   . Cosmetic surgery 07/2010    (lower facelift, lower blepharoplasty)    Family History  Problem Relation Age of Onset  . Stroke Mother   . Hypertension Mother   . Cancer Brother     diagnosed in late 13's    Allergies  Allergen Reactions  . Hydrocodone-Acetaminophen     REACTION: Shortness of Breath  . Diovan (Valsartan)     Eye redness    Current Outpatient Prescriptions on File Prior to Visit  Medication Sig Dispense Refill  . aspirin (ASPIR-LOW) 81 MG EC tablet Take 81 mg by mouth daily.        Marland Kitchen esomeprazole (NEXIUM) 40 MG capsule Take 1 capsule (40 mg total) by mouth daily before  breakfast.  30 capsule  0  . traMADol (ULTRAM) 50 MG tablet Take 50 mg by mouth 2 (two) times daily as needed. For neck pain       . Hydroquinone 4 % EMUL Apply topically. Apply topically  affected area two times a day         BP 120/72  Pulse 67  Temp(Src) 98.1 F (36.7 C) (Oral)  Resp 18  Wt 251 lb (113.853 kg)  SpO2 97%    Objective:   Physical Exam     Constitutional: overweight, very pleasant 55 y/o female Cardiovascular: Normal rate, regular rhythm and normal heart sounds.  Exam reveals no gallop and no friction rub.   No murmur heard. Pulmonary/Chest: Effort normal and breath sounds normal.  No wheezes. No rales.  Lower ext:  No calf swelling or tenderness Skin: Skin is warm and dry.    Assessment & Plan:

## 2011-03-11 ENCOUNTER — Telehealth: Payer: Self-pay | Admitting: *Deleted

## 2011-03-11 NOTE — Telephone Encounter (Signed)
She will need to be seen.  I would recommend that she go to Urgent care if she is unable to come to the office.

## 2011-03-11 NOTE — Telephone Encounter (Signed)
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 She would like to know if a Rx for pink eye could be sen to the pharmacy for her.   Call returned to patient at [phone], she has right eye pain, red, aggravated by light, she denies drainage at this time. She was informed Dr Artist Pais was out of the office today, and I would check with Melissa regarding her concerns and call her back.

## 2011-03-11 NOTE — Telephone Encounter (Signed)
Call returned to patient [phone], she was informed per [facility]'s instructions. Patient was advised to check with Saturday clinic across from [place]. Patient has verbalized understanding and states will have her eye evaluated

## 2011-03-18 NOTE — Op Note (Signed)
[place]  Patient:    Gina Ryan, Gina Ryan                        MRN: [id number] Proc. Date: 09/11/00 Adm. Date:  [id number] Attending:  Abigail Miyamoto A                           Operative Report  PREOPERATIVE DIAGNOSIS:  Small bowel obstruction.  POSTOPERATIVE DIAGNOSIS:  Small bowel obstruction.  PROCEDURE:  1. Exploratory laparotomy.             2. Lysis of adhesions.  SURGEON:  Abigail Miyamoto, M.D.  ASSISTANT:  Jimmye Norman, M.D.  ANESTHESIA:  General endotracheal anesthesia.  ESTIMATED BLOOD LOSS:  Minimal.  FINDINGS: The patient was found to have a single thick adhesive band causing complete small bowel obstruction in the distal small bowel.  This was relieved without the need to resect the intestines.  PROCEDURE IN DETAIL:  The patient was brought to the operating room and was identified as [employer].  She was placed supine on the operating room table, and general anesthesia was induced.  Her abdomen was then prepped and draped in the usual sterile fashion.  Using a #10 blade, an upper midline incision was created through the patients previous scar.  The incision was carried down through the fascia with the cautery.  The peritoneum was then opened entirely through the incision.  The patient was found to have minimal adhesions of the omentum to the abdominal wall, which were taken down with cautery.  The patient was then found to have largely dilated proximal small bowel and collapsed distal small bowel. A single dense adhesive band was found in the distal small bowel, causing complete obstruction.  This was taken down with the electrocautery.  The bowel then opened up well, relieving the obstruction.  Several adhesions were taken down with the Metzenbaum scissors. A large amount of the small bowel contents were then milked back proximally into the stomach and out the NG tube.  Again, the site of obstruction was evaluated, and  the lumen was felt to be quite patent and the bowel wall viable.  At this point, the contents were returned into the abdominal cavity. The midline fascia was then closed with a running #1 PDS suture.  The skin was then irrigated and closed with skin staples.  The patient tolerated the procedure well.  All sponge, needle, and instrument counts were correct at the end of the procedure.  The patient was then extubated in the operating room and was taken in a stable condition to the recovery room. DD:  09/11/00 TD:  09/11/00 Job: [id number]

## 2011-03-18 NOTE — Discharge Summary (Signed)
[place]  Patient:    Gina Ryan, Gina Ryan                        MRN: [id number] Adm. Date:  [id number] Disc. Date: [id number] Attending:  Abigail Miyamoto A                           Discharge Summary  HISTORY OF PRESENT ILLNESS:  Gina Ryan is a 55 year old female who presented to the emergency room at [facility] on September 08, 2000 with a three-day history of abdominal pain, nausea, and vomiting.  She was found on physical examination to have a soft but obese abdomen which was mildly tender.  She also presented with a white blood count of 14.7.  A three-way of the abdomen showed the patient to have multiple loops of dilated small bowel with positive air-fluid levels consistent with a small-bowel obstruction.  HOSPITAL COURSE:  The patient was admitted with a diagnosis of small-bowel obstruction and a nasogastric tube was placed for decompression of the bowel. She was then managed conservatively through the next couple days with NG suctioning and bowel rest; however, her abdominal films failed to show much improvement in the obstructive pattern.  Secondary to this the decision was made to proceed to the operating room on September 11, 2000.  The patient was taken to the operating room and underwent exploratory laparotomy with lysis of adhesions.  A single adhesive band was found causing the obstruction. Postoperatively, the patient did well and other than a mild postoperative fever her bowel function slowly returned, her NG tube was removed, and she was continued on IV antibiotics.  By postoperative day #3, her Foley catheter was removed and she was started on some sips of liquid.  Her midline abdominal wound did show some erythema and mild drainage so it was opened up and was consistent with a wound infection.  By September 18, 2000, the patient was doing very well, was tolerating a regular diet, was having bowel movements, and by September 19, 2000 was  doing well with the wet-to-dry dressing changes on the midline incision so the decision was made to discharge the patient home.  DISCHARGE DIAGNOSES: 1. Small-bowel obstruction. 2. Status post exploratory laparotomy and lysis of adhesions. 3. Wound infection.  DISCHARGE DIET:  Regular.  DISCHARGE ACTIVITY:  As tolerated.  WOUND CARE:  She is to undergo wet-to-dry b.i.d. dressing changes.  DISCHARGE MEDICATIONS:  She will take Vicodin for pain.  She will resume her home medications.  DISCHARGE FOLLOW-UP:  She will follow up in my office in one week post discharge. DD:  10/17/00 TD:  10/18/00 Job: [id number]

## 2011-03-18 NOTE — Op Note (Signed)
NAME:  Gina Ryan, Gina Ryan                           ACCOUNT NO.:  [id number]   MEDICAL RECORD NO.:  [id number]                   PATIENT TYPE:  AMB   LOCATION:  DAY                                  FACILITY:  [facility]   PHYSICIAN:  Jene Every, M.D.                 DATE OF BIRTH:  [birth year]   DATE OF PROCEDURE:  07/30/2003  DATE OF DISCHARGE:                                 OPERATIVE REPORT   PREOPERATIVE DIAGNOSES:  1. History of arthrosis.  2. Rotator cuff tear.  3. Impingement syndrome.   POSTOPERATIVE DIAGNOSES:  1. History of arthrosis.  2. Massive tear of the rotator cuff.  3. Impingement syndrome.   PROCEDURES PERFORMED:  1. Distal clavicle resection.  2. Acromioplasty.  3. Rotator cuff repair with patch graft.   ANESTHESIA:  General.   SURGEON:  Jene Every, M.D.   ASSISTANT:  Roma Schanz, P.Ryan.   HISTORY:  The patient is Ryan 55 year old with refractory shoulder pain, MRI  indicating rotator cuff tear, and history of arthrosis and impingement from  Ryan large spur of the anterior aspect of the acromion and distal clavicle.  Operative intervention is indicated for acromioplasty, distal clavicle  resection, decompression of the subacromial region, and repair of the  rotator cuff.  The risks and benefits were discussed, including bleeding,  infection, __________, suboptimal range of motion, retear, etc.   TECHNIQUE:  With the patient supine in the beach chair position after  satisfactory general anesthesia and 1 g of Kefzol, the right shoulder and  upper extremity was prepped and draped in the usual sterile fashion.  Ryan  surgical marker was utilized to delineate the acromion and AC joint.  An  incision was made in the anterior aspect of the Langer lines, bisecting the  distant between the anterolateral aspect of the acromion and the [facility] joint.  The subcutaneous tissues were dissected.  Electrocautery was utilized to  achieve hemostasis.  Raphe between the  anterolateral heads was identified  and divided.  Subperiosteal elevator from the anterior aspect of the  acromion was enlarged.  An ancillary projecting spur was noted.  The capsule  over the [facility] joint was divided, skeletonized, and protected with Ryan Homan  retractor anteriorly and posteriorly, as well as inferiorly there was Ryan  large projecting spur off of the acromion and degenerative changes were  severe in the [facility] joint.  The oscillating saw was utilized to remove 4 mm of  the distal clavicle, which was then undercut with Ryan 3 mm Kerrison to remove  the inferiorly projecting spur.  That was copiously irrigated.  There was no  tear underneath the rotator cuff.  Bone wax was placed over the distal  clavicle.  We preserved the ligamentous attachment to the clavicle.  Next,  the CA ligament was detached from the anterior aspect of the acromion.  The  large spur was  removed with the [facility] rongeur and then with an oscillating  saw.  This was further contoured with Ryan high-speed bur beneath the acromion.  Wound copiously irrigated.  Inspection of the subacromial region revealed  extensive tearing of the rotator cuff.  It appeared that the supraspinatus  was split near the mid substance, as well as its confluence with the  subscapularis.  This was torn in multiple planes, Ryan fairly long longitudinal  split with degenerative nonviable edges.  This was split in approximately  75% of the tendon.  The undersurface of that tendon appeared to have  attachments out laterally.  Then there was multiple degenerative fraying  laterally.  The edges of the tendon were debrided to good bleeding tissue  and mobilized with an Allis to the glenohumeral joint no further.  I took Ryan  rongeur and produced Ryan bleeding bed of bone laterally.  This was at the  greater tuberosity.  I then put the arm at the side and repaired the  longitudinal split with #1 Ethibond interrupted figure-of-eight sutures,  pulling the splint  together on top of the remaining tendon out laterally.  I  could not advance recovery of Ryan 1 sq cm region of the tuberosity in the  lateral aspect of the bed after debridement of the tissues.  I therefore  felt it was necessary to apply Ryan patch graft.  We applied the right medical  graft jacket, soaked it appropriately, reconstituted it, folded it over, and  tied it into the edges of the rotator cuff tendon, traversing the bed of  bone with 2-0 Vicryl sutures covering the bare area appropriately.  Next we  ranged the shoulder.  There was good range of motion.  No residual  impingement was noted.  There was some bursa noted posteriorly.  This was  excised.  The wound was copiously irrigated once again.  The capsule and  double trapezial fascia were repaired with #1 Vicryl interrupted figure-of-  eight sutures.  The raphe in the rotator cuff was repaired with #1 Vicryl  interrupted figure-of-eight sutures over to the top of the acromion.  There  was no active bleeding.  The subcutaneous tissue was reapproximated with #2  Vicryl simple sutures, the skin was reapproximated with 4-0 subcuticular,  and the wound was reinforced with Steri-Strips.  Ryan sterile dressing was  applied.  Abduction was placed.  Extubated without difficulty and  transported to recovery room in satisfactory condition.  The patient  tolerated the procedure well.  No complications.                                               Jene Every, M.D.    Cordelia Pen  D:  07/30/2003  T:  07/30/2003  Job:  [id number]

## 2011-05-20 DIAGNOSIS — E8941 Symptomatic postprocedural ovarian failure: Secondary | ICD-10-CM | POA: Insufficient documentation

## 2011-05-20 NOTE — Assessment & Plan Note (Signed)
Very well controlled on Micardis.  She is tolerating well. BP: 120/72 mmHg   Lab Results  Component Value Date   CREATININE 0.62 01/19/2011   Lab Results  Component Value Date   NA 142 01/19/2011   K 5.0 01/19/2011   CL 103 01/19/2011   CO2 28 01/19/2011

## 2011-05-30 ENCOUNTER — Other Ambulatory Visit: Payer: Self-pay | Admitting: Internal Medicine

## 2011-05-30 NOTE — Telephone Encounter (Signed)
Rx refill sent to pharmacy. 

## 2011-06-01 ENCOUNTER — Other Ambulatory Visit: Payer: Self-pay | Admitting: Internal Medicine

## 2011-06-02 ENCOUNTER — Telehealth: Payer: Self-pay | Admitting: *Deleted

## 2011-06-02 NOTE — Telephone Encounter (Signed)
Pt last seen in May and advised f/u in August. No appt on file. Please call pt to arrange f/u. Refill sent to pharmacy for 1 month supply of estradiol patch.

## 2011-06-02 NOTE — Telephone Encounter (Signed)
Left message on machine that a month supply of climara was sent to pharmacy and that patient does need to make a follow up appt for this month.

## 2011-06-02 NOTE — Telephone Encounter (Signed)
Patient called and left voice message requesting refill on Nexium.  Call placed to [facility], verified with pharmacy rx is awaiting pick up by patient.   Call placed to patient at [phone] she was informed rx is awaiting pick up per pharmacist.

## 2011-08-15 ENCOUNTER — Other Ambulatory Visit: Payer: Self-pay | Admitting: Internal Medicine

## 2011-09-19 ENCOUNTER — Encounter: Payer: Self-pay | Admitting: Internal Medicine

## 2011-09-19 ENCOUNTER — Ambulatory Visit (INDEPENDENT_AMBULATORY_CARE_PROVIDER_SITE_OTHER): Payer: BC Managed Care – PPO | Admitting: Internal Medicine

## 2011-09-19 VITALS — BP 124/80 | HR 84 | Temp 98.2°F | Ht 66.0 in | Wt 247.0 lb

## 2011-09-19 DIAGNOSIS — I10 Essential (primary) hypertension: Secondary | ICD-10-CM

## 2011-09-19 MED ORDER — LOSARTAN POTASSIUM 50 MG PO TABS: 50.0000 mg | ORAL_TABLET | Freq: Every day | ORAL | Status: DC

## 2011-09-19 NOTE — Progress Notes (Signed)
  Subjective:    Patient ID: Gina Ryan, female    DOB: [birth year], 55 y.o.   MRN: [id number]  HPI  55 year old African American female with hypertension and borderline diabetes for followup. Overall patient has been doing very well. She ran out of her blood pressure medications approximately one to 2 weeks ago. She does not monitor her blood pressure at home. She denies headache or blood pressure.   Review of Systems Weight has been stable  Past Medical History  Diagnosis Date  . Hypertension   . Morbid obesity   . GERD (gastroesophageal reflux disease)   . Osteoarthritis, knee   . Atypical chest pain     History   Social History  . Marital Status: Single    Spouse Name: N/A    Number of Children: N/A  . Years of Education: N/A   Occupational History  . [employer]   Social History Main Topics  . Smoking status: Never Smoker   . Smokeless tobacco: Not on file  . Alcohol Use: No  . Drug Use:   . Sexually Active:    Other Topics Concern  . Not on file   Social History Narrative   Lives aloneDaugher , son and 4 grand children live in the areWidowed - husband died 15 yrs ago    Past Surgical History  Procedure Date  . Cholecystectomy   . Total abdominal hysterectomy   . Gastric bypass   . Cosmetic surgery 07/2010    (lower facelift, lower blepharoplasty)    Family History  Problem Relation Age of Onset  . Stroke Mother   . Hypertension Mother   . Cancer Brother     diagnosed in late 22's    Allergies  Allergen Reactions  . Hydrocodone-Acetaminophen     REACTION: Shortness of Breath  . Diovan (Valsartan)     Eye redness    Current Outpatient Prescriptions on File Prior to Visit  Medication Sig Dispense Refill  . aspirin (ASPIR-LOW) 81 MG EC tablet Take 81 mg by mouth daily.        Marland Kitchen estradiol (CLIMARA - DOSED IN MG/24 HR) 0.05 mg/24hr PLACE 1 PATCH ONTO SKIN ONCE A WEEK  4 patch  0  . Hydroquinone 4 % EMUL Apply  topically. Apply topically  affected area two times a day       . NEXIUM 40 MG capsule take 1 capsule by mouth once daily before BREAKFAST.  30 capsule  6  . traMADol (ULTRAM) 50 MG tablet Take 50 mg by mouth 2 (two) times daily as needed. For neck pain         BP 124/80  Pulse 84  Temp(Src) 98.2 F (36.8 C) (Oral)  Ht 5\' 6"  (1.676 m)  Wt 247 lb (112.038 kg)  BMI 39.87 kg/m2       Objective:   Physical Exam  Constitutional: She appears well-developed and well-nourished.  HENT:  Head: Normocephalic and atraumatic.  Cardiovascular: Normal rate, regular rhythm and normal heart sounds.   Pulmonary/Chest: Effort normal and breath sounds normal. No respiratory distress.  Musculoskeletal: She exhibits no edema.  Skin: Skin is warm and dry.  Psychiatric: She has a normal mood and affect. Her behavior is normal.          Assessment & Plan:

## 2011-09-19 NOTE — Assessment & Plan Note (Addendum)
I am surprised patient's blood pressure is relatively normal considering patient has been off her blood pressure medication x1 week..  Change micardis to  losartan 50 mg. Patient advised to keep blood pressure log at home and bring to her next followup appointment.  Monitor BMET before next follow up appt.  BP: 124/80 mmHg

## 2011-09-19 NOTE — Patient Instructions (Signed)
Please monitor your blood pressure at home as directed and bring blood pressure log to your next follow up appointment Please complete the following lab tests before your next follow up appointment: BMET - 401.9, A1c - 790.29

## 2011-10-06 ENCOUNTER — Telehealth: Payer: Self-pay | Admitting: Internal Medicine

## 2011-10-06 NOTE — Telephone Encounter (Signed)
Please resend Losartan (Cozaar) 50 mg to [facility]. Patient stated that the pharmacy does not have it. (chart shows that it was sent on 09-19-2011). Please advise. Thanks.

## 2011-10-07 ENCOUNTER — Ambulatory Visit (INDEPENDENT_AMBULATORY_CARE_PROVIDER_SITE_OTHER): Payer: Self-pay | Admitting: Internal Medicine

## 2011-10-07 ENCOUNTER — Encounter: Payer: Self-pay | Admitting: Internal Medicine

## 2011-10-07 VITALS — BP 142/82 | Temp 98.0°F

## 2011-10-07 DIAGNOSIS — K529 Noninfective gastroenteritis and colitis, unspecified: Secondary | ICD-10-CM | POA: Insufficient documentation

## 2011-10-07 DIAGNOSIS — K5289 Other specified noninfective gastroenteritis and colitis: Secondary | ICD-10-CM

## 2011-10-07 MED ORDER — LOSARTAN POTASSIUM 50 MG PO TABS: 50.0000 mg | ORAL_TABLET | Freq: Every day | ORAL | Status: DC

## 2011-10-07 MED ORDER — ONDANSETRON HCL 4 MG PO TABS: 4.0000 mg | ORAL_TABLET | Freq: Three times a day (TID) | ORAL | Status: AC | PRN

## 2011-10-07 NOTE — Patient Instructions (Signed)
Increase your intake of fluids Take anti nausea medication as needed Please call our office if your symptoms do not improve or gets worse.

## 2011-10-07 NOTE — Telephone Encounter (Signed)
rx sent in electronically 

## 2011-10-07 NOTE — Assessment & Plan Note (Signed)
I suspect patient's symptoms from viral gastroenteritis.  Use zofran for nausea and increase fluid intake. Patient advised to call office if symptoms persist or worsen.

## 2011-10-07 NOTE — Progress Notes (Signed)
Subjective:    Patient ID: Harlow Asa, female    DOB: [birth year], 55 y.o.   MRN: [id number]  HPI  55 year old African American female complains of acute onset of nausea and vomiting. Her symptoms started last night. She reports chills but no fever. She has vomited twice. She denies diarrhea. She reports associated headache.  Review of Systems Negative for fever  Past Medical History  Diagnosis Date  . Hypertension   . Morbid obesity   . GERD (gastroesophageal reflux disease)   . Osteoarthritis, knee   . Atypical chest pain     History   Social History  . Marital Status: Single    Spouse Name: N/A    Number of Children: N/A  . Years of Education: N/A   Occupational History  . [employer]   Social History Main Topics  . Smoking status: Never Smoker   . Smokeless tobacco: Not on file  . Alcohol Use: No  . Drug Use:   . Sexually Active:    Other Topics Concern  . Not on file   Social History Narrative   Lives aloneDaugher , son and 4 grand children live in the areWidowed - husband died 15 yrs ago    Past Surgical History  Procedure Date  . Cholecystectomy   . Total abdominal hysterectomy   . Gastric bypass   . Cosmetic surgery 07/2010    (lower facelift, lower blepharoplasty)    Family History  Problem Relation Age of Onset  . Stroke Mother   . Hypertension Mother   . Cancer Brother     diagnosed in late 55's    Allergies  Allergen Reactions  . Hydrocodone-Acetaminophen     REACTION: Shortness of Breath  . Diovan (Valsartan)     Eye redness    Current Outpatient Prescriptions on File Prior to Visit  Medication Sig Dispense Refill  . aspirin (ASPIR-LOW) 81 MG EC tablet Take 81 mg by mouth daily.        Marland Kitchen estradiol (CLIMARA - DOSED IN MG/24 HR) 0.05 mg/24hr PLACE 1 PATCH ONTO SKIN ONCE A WEEK  4 patch  0  . Hydroquinone 4 % EMUL Apply topically. Apply topically  affected area two times a day       . losartan  (COZAAR) 50 MG tablet Take 1 tablet (50 mg total) by mouth daily.  30 tablet  3  . NEXIUM 40 MG capsule take 1 capsule by mouth once daily before BREAKFAST.  30 capsule  6  . traMADol (ULTRAM) 50 MG tablet Take 50 mg by mouth 2 (two) times daily as needed. For neck pain         BP 142/82  Temp(Src) 98 F (36.7 C) (Oral)      Objective:   Physical Exam   Constitutional: Appears well-developed and well-nourished. No distress.  Head: Normocephalic and atraumatic.  Ear:  Right and left ear normal.  TMs clear.  Hearing is grossly normal Mouth/Throat: Oropharynx is clear and moist.  Eyes: Conjunctivae are normal. Pupils are equal, round, and reactive to light. No nystagmus Neck: Normal range of motion. Neck supple. No thyromegaly present. No carotid bruit Cardiovascular: Normal rate, regular rhythm and normal heart sounds. Pulmonary/Chest: Effort normal and breath sounds normal.  No wheezes. No rales.  Abdominal: Soft. Bowel sounds are normal. No mass. There is no tenderness.  Skin: Skin is warm and dry.  Psychiatric: Normal mood and affect. Behavior is normal.  Assessment & Plan:

## 2011-10-11 ENCOUNTER — Other Ambulatory Visit (INDEPENDENT_AMBULATORY_CARE_PROVIDER_SITE_OTHER): Payer: Self-pay

## 2011-10-11 DIAGNOSIS — I10 Essential (primary) hypertension: Secondary | ICD-10-CM

## 2011-10-11 DIAGNOSIS — R7309 Other abnormal glucose: Secondary | ICD-10-CM

## 2011-10-11 LAB — BASIC METABOLIC PANEL
BUN: 16 mg/dL (ref 6–23)
Chloride: 109 mEq/L (ref 96–112)
Creatinine, Ser: 0.8 mg/dL (ref 0.4–1.2)
GFR: 102.84 mL/min (ref >60.00)
Potassium: 4.3 mEq/L (ref 3.5–5.1)
Sodium: 146 mEq/L — ABNORMAL HIGH (ref 135–145)

## 2011-10-28 ENCOUNTER — Ambulatory Visit: Payer: Self-pay | Admitting: Internal Medicine

## 2012-06-27 ENCOUNTER — Telehealth: Payer: Self-pay | Admitting: Family Medicine

## 2012-06-27 ENCOUNTER — Telehealth: Payer: Self-pay | Admitting: Internal Medicine

## 2012-06-27 MED ORDER — ESOMEPRAZOLE MAGNESIUM 40 MG PO CPDR: 40.0000 mg | DELAYED_RELEASE_CAPSULE | Freq: Every day | ORAL | Status: DC

## 2012-06-27 MED ORDER — LOSARTAN POTASSIUM 50 MG PO TABS: 50.0000 mg | ORAL_TABLET | Freq: Every day | ORAL | Status: DC

## 2012-06-27 NOTE — Telephone Encounter (Signed)
rx sent in electronically 

## 2012-06-27 NOTE — Telephone Encounter (Signed)
They have a refill request on Cozaar. Pt also wants a refill on Nexium 40mg  QD.

## 2012-06-27 NOTE — Telephone Encounter (Signed)
Pt called and said that [facility] on [address] sent in a refill req 3 days ago for pts losartan (COZAAR) 50 MG tablet and haven't gotten a response yet. Pt has been out of med for 2 days. Pls call in today asap.

## 2012-08-27 ENCOUNTER — Ambulatory Visit (INDEPENDENT_AMBULATORY_CARE_PROVIDER_SITE_OTHER): Payer: BC Managed Care – PPO | Admitting: Internal Medicine

## 2012-08-27 ENCOUNTER — Encounter: Payer: Self-pay | Admitting: Internal Medicine

## 2012-08-27 VITALS — BP 124/82 | HR 72 | Temp 97.9°F | Wt 252.0 lb

## 2012-08-27 DIAGNOSIS — E8941 Symptomatic postprocedural ovarian failure: Secondary | ICD-10-CM

## 2012-08-27 DIAGNOSIS — K219 Gastro-esophageal reflux disease without esophagitis: Secondary | ICD-10-CM

## 2012-08-27 MED ORDER — OMEPRAZOLE 40 MG PO CPDR: 40.0000 mg | DELAYED_RELEASE_CAPSULE | Freq: Every day | ORAL | Status: DC

## 2012-08-27 MED ORDER — ESTRADIOL 1 MG PO TABS: 1.0000 mg | ORAL_TABLET | Freq: Every day | ORAL | Status: DC

## 2012-08-27 NOTE — Assessment & Plan Note (Signed)
Patient could not continue estrogen patch. Her patch frequently fell off. Trial of estradiol 1 mg once daily.

## 2012-08-27 NOTE — Progress Notes (Signed)
  Subjective:    Patient ID: Gina Ryan, female    DOB: [birth year], 56 y.o.   MRN: [id number]  HPI  56 year old African American female for followup regarding gastroesophageal reflux disease. Patient does not have classic heartburn symptoms but when she did not take her Nexium she gets burning sensation in her throat especially at night. Nexium is cost prohibitive.  She has history of complete abdominal hysterectomy. She has been on estrogen patch. Patient reports difficulty with patch falling off. She would like to switch to oral estrogen.   Review of Systems Negative for dysphasia, negative for significant weight change  Past Medical History  Diagnosis Date  . Hypertension   . Morbid obesity   . GERD (gastroesophageal reflux disease)   . Osteoarthritis, knee   . Atypical chest pain     History   Social History  . Marital Status: Single    Spouse Name: N/A    Number of Children: N/A  . Years of Education: N/A   Occupational History  . [employer]   Social History Main Topics  . Smoking status: Never Smoker   . Smokeless tobacco: Not on file  . Alcohol Use: No  . Drug Use:   . Sexually Active:    Other Topics Concern  . Not on file   Social History Narrative   Lives aloneDaugher , son and 4 grand children live in the areWidowed - husband died 15 yrs ago    Past Surgical History  Procedure Date  . Cholecystectomy   . Total abdominal hysterectomy   . Gastric bypass   . Cosmetic surgery 07/2010    (lower facelift, lower blepharoplasty)    Family History  Problem Relation Age of Onset  . Stroke Mother   . Hypertension Mother   . Cancer Brother     diagnosed in late 58's    Allergies  Allergen Reactions  . Hydrocodone-Acetaminophen     REACTION: Shortness of Breath  . Diovan (Valsartan)     Eye redness    Current Outpatient Prescriptions on File Prior to Visit  Medication Sig Dispense Refill  . traMADol (ULTRAM) 50  MG tablet Take 50 mg by mouth 2 (two) times daily as needed. For neck pain       . DISCONTD: losartan (COZAAR) 50 MG tablet Take 1 tablet (50 mg total) by mouth daily.  30 tablet  3  . omeprazole (PRILOSEC) 40 MG capsule Take 1 capsule (40 mg total) by mouth daily.  90 capsule  1  . DISCONTD: estradiol (CLIMARA - DOSED IN MG/24 HR) 0.05 mg/24hr PLACE 1 PATCH ONTO SKIN ONCE A WEEK  4 patch  0    BP 124/82  Pulse 72  Temp 97.9 F (36.6 C) (Oral)  Wt 252 lb (114.306 kg)       Objective:   Physical Exam  Constitutional: She is oriented to person, place, and time. She appears well-developed and well-nourished.  Cardiovascular: Normal rate, regular rhythm and normal heart sounds.   Pulmonary/Chest: Effort normal and breath sounds normal. She has no wheezes.  Neurological: She is alert and oriented to person, place, and time.  Psychiatric: She has a normal mood and affect. Her behavior is normal.          Assessment & Plan:

## 2012-08-27 NOTE — Assessment & Plan Note (Signed)
Patient has persistent burning sensation in her throat when she stop using her proton pump inhibitor. Nexium is cost prohibitive. Switch to omeprazole 40 mg once daily.

## 2013-01-02 ENCOUNTER — Other Ambulatory Visit: Payer: Self-pay | Admitting: Internal Medicine

## 2013-02-22 ENCOUNTER — Other Ambulatory Visit: Payer: Self-pay | Admitting: Internal Medicine

## 2013-02-25 ENCOUNTER — Other Ambulatory Visit: Payer: Self-pay | Admitting: Internal Medicine

## 2013-04-26 ENCOUNTER — Telehealth: Payer: Self-pay | Admitting: Internal Medicine

## 2013-04-26 MED ORDER — PERMETHRIN 5 % EX CREA: TOPICAL_CREAM | CUTANEOUS | Status: DC

## 2013-04-26 NOTE — Telephone Encounter (Signed)
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 Pt has no symptoms yet. Pt stated she talked with health department and due to she is taking care of infant she should start medication asap.

## 2013-04-26 NOTE — Telephone Encounter (Signed)
Call in permethrin 5% cream,  Disp 1 tube.  Use as directed.  No RF

## 2013-04-26 NOTE — Telephone Encounter (Signed)
rx sent in electronically, pt aware 

## 2013-05-15 ENCOUNTER — Other Ambulatory Visit: Payer: Self-pay | Admitting: Internal Medicine

## 2013-08-12 ENCOUNTER — Other Ambulatory Visit: Payer: Self-pay | Admitting: Internal Medicine

## 2013-10-11 ENCOUNTER — Ambulatory Visit: Payer: BC Managed Care – PPO | Admitting: Internal Medicine

## 2013-10-11 ENCOUNTER — Telehealth: Payer: Self-pay | Admitting: Internal Medicine

## 2013-10-11 MED ORDER — LOSARTAN POTASSIUM 50 MG PO TABS: ORAL_TABLET | ORAL | Status: DC

## 2013-10-11 MED ORDER — ESTRADIOL 1 MG PO TABS: ORAL_TABLET | ORAL | Status: DC

## 2013-10-11 NOTE — Telephone Encounter (Signed)
rx sent in electronically 

## 2013-10-11 NOTE — Telephone Encounter (Signed)
Pt had to rsc her appt to 10-30-13. Pt needs refill on estradiol 1mg  and losartan 50 mg call into [facility] [address]. Pt is out

## 2013-10-30 ENCOUNTER — Ambulatory Visit: Payer: BC Managed Care – PPO | Admitting: Internal Medicine

## 2013-11-18 ENCOUNTER — Encounter: Payer: Self-pay | Admitting: Internal Medicine

## 2013-11-18 ENCOUNTER — Ambulatory Visit (INDEPENDENT_AMBULATORY_CARE_PROVIDER_SITE_OTHER): Payer: Self-pay | Admitting: Internal Medicine

## 2013-11-18 VITALS — BP 146/80 | HR 80 | Temp 98.5°F | Ht 66.0 in | Wt 249.0 lb

## 2013-11-18 DIAGNOSIS — R7309 Other abnormal glucose: Secondary | ICD-10-CM

## 2013-11-18 DIAGNOSIS — I10 Essential (primary) hypertension: Secondary | ICD-10-CM

## 2013-11-18 DIAGNOSIS — Z0000 Encounter for general adult medical examination without abnormal findings: Secondary | ICD-10-CM

## 2013-11-18 DIAGNOSIS — Z23 Encounter for immunization: Secondary | ICD-10-CM

## 2013-11-18 DIAGNOSIS — Z9884 Bariatric surgery status: Secondary | ICD-10-CM

## 2013-11-18 DIAGNOSIS — E8941 Symptomatic postprocedural ovarian failure: Secondary | ICD-10-CM

## 2013-11-18 MED ORDER — ESTRADIOL 1 MG PO TABS: ORAL_TABLET | ORAL | Status: DC

## 2013-11-18 MED ORDER — LOSARTAN POTASSIUM 50 MG PO TABS: 50.0000 mg | ORAL_TABLET | Freq: Every day | ORAL | Status: DC

## 2013-11-18 NOTE — Assessment & Plan Note (Signed)
Encouraged health eating and regular exercise.

## 2013-11-18 NOTE — Progress Notes (Signed)
Pre visit review using our clinic review tool, if applicable. No additional management support is needed unless otherwise documented below in the visit note. 

## 2013-11-18 NOTE — Assessment & Plan Note (Signed)
Monitor A1c 

## 2013-11-18 NOTE — Assessment & Plan Note (Signed)
Patient advised to gradually taper off estradiol. We discussed risks of using hormone replacement therapy.

## 2013-11-18 NOTE — Patient Instructions (Signed)
Continue your weight loss efforts Please sign on to MyChart to view your test results.

## 2013-11-18 NOTE — Progress Notes (Signed)
Subjective:    Patient ID: Gina Ryan, female    DOB: 02-15-1956, 58 y.o.   MRN: [id number]  HPI  58 year old African American female with history of hypertension, obesity gastroesophageal reflux disease for routine followup. It has been greater than a year since her previous office visit.  Patient has been using oral estrogen for severe hot flashes. She has history of complete abdominal hysterectomy. She reports only using estradiol on as-needed basis.  Hypertension-she reports good compliance with her antihypertensives. She monitors her blood pressure at home. She reports her systolic blood pressure readings are between 115 and 295 and her diastolic blood pressure in the 70s. She denies any chest pain. She denies any dizziness.  Preventative health care-she is overdue for her screening mammogram.  Review of Systems Negative for chest pain or shortness of breath    Past Medical History  Diagnosis Date  . Hypertension   . Morbid obesity   . GERD (gastroesophageal reflux disease)   . Osteoarthritis, knee   . Atypical chest pain     History   Social History  . Marital Status: Single    Spouse Name: N/A    Number of Children: N/A  . Years of Education: N/A   Occupational History  . [employer]   Social History Main Topics  . Smoking status: Never Smoker   . Smokeless tobacco: Not on file  . Alcohol Use: No  . Drug Use:   . Sexual Activity:    Other Topics Concern  . Not on file   Social History Narrative   Lives alone   Daugher , son and 4 grand children live in the are      Widowed - husband died 72 yrs ago    Past Surgical History  Procedure Laterality Date  . Cholecystectomy    . Total abdominal hysterectomy    . Gastric bypass    . Cosmetic surgery  07/2010    (lower facelift, lower blepharoplasty)    Family History  Problem Relation Age of Onset  . Stroke Mother   . Hypertension Mother   . Cancer Brother    diagnosed in late 54's    Allergies  Allergen Reactions  . Hydrocodone-Acetaminophen     REACTION: Shortness of Breath  . Diovan [Valsartan]     Eye redness    Current Outpatient Prescriptions on File Prior to Visit  Medication Sig Dispense Refill  . omeprazole (PRILOSEC) 40 MG capsule take 1 capsule by mouth once daily  90 capsule  1  . traMADol (ULTRAM) 50 MG tablet Take 50 mg by mouth 2 (two) times daily as needed. For neck pain        No current facility-administered medications on file prior to visit.    BP 146/80  Pulse 80  Temp(Src) 98.5 F (36.9 C) (Oral)  Ht 5\' 6"  (1.676 m)  Wt 249 lb (112.946 kg)  BMI 40.21 kg/m2    Objective:   Physical Exam  Constitutional: She is oriented to person, place, and time. She appears well-developed and well-nourished. No distress.  HENT:  Head: Normocephalic and atraumatic.  Right Ear: External ear normal.  Left Ear: External ear normal.  Eyes: EOM are normal.  Neck: Neck supple.  No carotid bruit  Cardiovascular: Normal rate and regular rhythm.   No murmur heard. Pulmonary/Chest: Effort normal and breath sounds normal. She has no wheezes.  Musculoskeletal: She exhibits no edema.  Lymphadenopathy:    She has  no cervical adenopathy.  Neurological: She is alert and oriented to person, place, and time. No cranial nerve deficit.  Skin: Skin is warm and dry.  Psychiatric: She has a normal mood and affect. Her behavior is normal.          Assessment & Plan:

## 2013-11-18 NOTE — Assessment & Plan Note (Signed)
Patient reports her home blood pressure readings are normal. She may have component of whitecoat hypertension. Continue same dose of losartan 50 mg once daily. She understands to avoid dehydration and concomitant use of NSAIDs.  Monitor electrolytes and kidney function.  Obtain fasting lipid panel for CV risk stratification. BP: 146/80 mmHg

## 2013-11-19 LAB — HEPATIC FUNCTION PANEL
ALT: 16 U/L (ref 0–35)
AST: 19 U/L (ref 0–37)
Albumin: 3.6 g/dL (ref 3.5–5.2)
Alkaline Phosphatase: 132 U/L — ABNORMAL HIGH (ref 39–117)
BILIRUBIN DIRECT: 0 mg/dL (ref 0.0–0.3)
Total Bilirubin: 0.3 mg/dL (ref 0.3–1.2)
Total Protein: 7.2 g/dL (ref 6.0–8.3)

## 2013-11-19 LAB — BASIC METABOLIC PANEL
BUN: 16 mg/dL (ref 6–23)
CALCIUM: 8.8 mg/dL (ref 8.4–10.5)
CHLORIDE: 108 meq/L (ref 96–112)
CO2: 27 mEq/L (ref 19–32)
CREATININE: 0.6 mg/dL (ref 0.4–1.2)
GFR: 122.57 mL/min (ref >60.00)
GLUCOSE: 94 mg/dL (ref 70–99)
Potassium: 4.3 mEq/L (ref 3.5–5.1)
Sodium: 141 mEq/L (ref 135–145)

## 2013-11-19 LAB — LIPID PANEL
CHOL/HDL RATIO: 2
Cholesterol: 187 mg/dL (ref 0–200)
HDL: 89.9 mg/dL (ref >39.00)
LDL Cholesterol: 84 mg/dL (ref 0–99)
Triglycerides: 64 mg/dL (ref 0.0–149.0)
VLDL: 12.8 mg/dL (ref 0.0–40.0)

## 2013-11-19 LAB — TSH: TSH: 14.2 u[IU]/mL — AB (ref 0.35–5.50)

## 2013-11-19 LAB — HEMOGLOBIN A1C: Hgb A1c MFr Bld: 6.1 % (ref 4.6–6.5)

## 2013-11-21 ENCOUNTER — Other Ambulatory Visit: Payer: Self-pay | Admitting: Internal Medicine

## 2013-11-21 DIAGNOSIS — Z1231 Encounter for screening mammogram for malignant neoplasm of breast: Secondary | ICD-10-CM

## 2013-11-25 ENCOUNTER — Other Ambulatory Visit: Payer: Self-pay | Admitting: Internal Medicine

## 2013-11-25 DIAGNOSIS — R748 Abnormal levels of other serum enzymes: Secondary | ICD-10-CM

## 2013-11-25 MED ORDER — LEVOTHYROXINE SODIUM 50 MCG PO TABS: 50.0000 ug | ORAL_TABLET | Freq: Every day | ORAL | Status: DC

## 2013-11-25 NOTE — Addendum Note (Signed)
Addended by: Townsend Roger D on: 11/25/2013 10:41 AM   Modules accepted: Orders

## 2013-11-29 ENCOUNTER — Other Ambulatory Visit (INDEPENDENT_AMBULATORY_CARE_PROVIDER_SITE_OTHER): Payer: Self-pay

## 2013-11-29 DIAGNOSIS — I10 Essential (primary) hypertension: Secondary | ICD-10-CM

## 2013-11-29 LAB — GAMMA GT: GGT: 9 U/L (ref 7–51)

## 2013-12-04 ENCOUNTER — Telehealth: Payer: Self-pay | Admitting: Internal Medicine

## 2013-12-04 NOTE — Telephone Encounter (Signed)
Relevant patient education mailed to patient.  

## 2013-12-30 ENCOUNTER — Other Ambulatory Visit (INDEPENDENT_AMBULATORY_CARE_PROVIDER_SITE_OTHER): Payer: BC Managed Care – PPO

## 2013-12-30 DIAGNOSIS — R748 Abnormal levels of other serum enzymes: Secondary | ICD-10-CM

## 2013-12-31 LAB — VITAMIN D 25 HYDROXY (VIT D DEFICIENCY, FRACTURES): Vit D, 25-Hydroxy: 22 ng/mL — ABNORMAL LOW (ref 30–89)

## 2013-12-31 LAB — PTH, INTACT AND CALCIUM
Calcium: 8.9 mg/dL (ref 8.4–10.5)
PTH: 101.7 pg/mL — AB (ref 14.0–72.0)

## 2014-01-01 ENCOUNTER — Ambulatory Visit
Admission: RE | Admit: 2014-01-01 | Discharge: 2014-01-01 | Disposition: A | Discharge status: Home / Self Care | Payer: BC Managed Care – PPO | Source: Ambulatory Visit | Attending: Internal Medicine | Admitting: Internal Medicine

## 2014-01-01 DIAGNOSIS — R748 Abnormal levels of other serum enzymes: Secondary | ICD-10-CM

## 2014-01-07 ENCOUNTER — Other Ambulatory Visit: Payer: Self-pay | Admitting: Internal Medicine

## 2014-01-07 ENCOUNTER — Ambulatory Visit: Payer: Self-pay

## 2014-01-07 DIAGNOSIS — R748 Abnormal levels of other serum enzymes: Secondary | ICD-10-CM

## 2014-01-19 ENCOUNTER — Other Ambulatory Visit: Payer: Self-pay | Admitting: Internal Medicine

## 2014-02-14 ENCOUNTER — Other Ambulatory Visit (INDEPENDENT_AMBULATORY_CARE_PROVIDER_SITE_OTHER): Payer: BC Managed Care – PPO

## 2014-02-14 DIAGNOSIS — E039 Hypothyroidism, unspecified: Secondary | ICD-10-CM

## 2014-02-14 DIAGNOSIS — E559 Vitamin D deficiency, unspecified: Secondary | ICD-10-CM

## 2014-02-14 LAB — TSH: TSH: 0.62 u[IU]/mL (ref 0.35–5.50)

## 2014-02-15 ENCOUNTER — Other Ambulatory Visit: Payer: Self-pay | Admitting: Internal Medicine

## 2014-02-15 LAB — VITAMIN D 25 HYDROXY (VIT D DEFICIENCY, FRACTURES): VIT D 25 HYDROXY: 28 ng/mL — AB (ref 30–89)

## 2014-02-17 LAB — PTH, INTACT AND CALCIUM
Calcium: 8.8 mg/dL (ref 8.4–10.5)
PTH: 96.5 pg/mL — AB (ref 14.0–72.0)

## 2014-02-18 ENCOUNTER — Other Ambulatory Visit: Payer: Self-pay | Admitting: *Deleted

## 2014-02-18 MED ORDER — VITAMIN D (ERGOCALCIFEROL) 1.25 MG (50000 UNIT) PO CAPS: 50000.0000 [IU] | ORAL_CAPSULE | ORAL | Status: DC

## 2014-02-18 MED ORDER — LEVOTHYROXINE SODIUM 50 MCG PO TABS: ORAL_TABLET | ORAL | Status: DC

## 2014-02-21 ENCOUNTER — Ambulatory Visit: Payer: Self-pay | Admitting: Internal Medicine

## 2014-03-03 ENCOUNTER — Ambulatory Visit (INDEPENDENT_AMBULATORY_CARE_PROVIDER_SITE_OTHER): Payer: BC Managed Care – PPO | Admitting: Internal Medicine

## 2014-03-03 ENCOUNTER — Encounter: Payer: Self-pay | Admitting: Internal Medicine

## 2014-03-03 VITALS — BP 142/90 | HR 72 | Temp 98.4°F | Ht 66.0 in | Wt 266.0 lb

## 2014-03-03 DIAGNOSIS — E039 Hypothyroidism, unspecified: Secondary | ICD-10-CM

## 2014-03-03 DIAGNOSIS — E349 Endocrine disorder, unspecified: Secondary | ICD-10-CM

## 2014-03-03 DIAGNOSIS — R635 Abnormal weight gain: Secondary | ICD-10-CM | POA: Insufficient documentation

## 2014-03-03 DIAGNOSIS — I10 Essential (primary) hypertension: Secondary | ICD-10-CM

## 2014-03-03 MED ORDER — LOSARTAN POTASSIUM-HCTZ 50-12.5 MG PO TABS: 1.0000 | ORAL_TABLET | Freq: Every day | ORAL | Status: DC

## 2014-03-03 MED ORDER — POTASSIUM CHLORIDE ER 10 MEQ PO TBCR: 10.0000 meq | EXTENDED_RELEASE_TABLET | Freq: Every day | ORAL | Status: DC

## 2014-03-03 NOTE — Patient Instructions (Signed)
Limit your caloric intake to 1200 cal per day.  Continue your exercise program. You can try taking Magnesium supplement over the counter (Magnesium citrate 200 mg) once daily Please complete the following lab tests before your next follow up appointment: BMET - 401.9

## 2014-03-03 NOTE — Assessment & Plan Note (Signed)
Suboptimally controlled.  Change losartan to Hyzaar 50/12.5 mg once daily.  Also start potassium supplementation. Add potassium chloride 10 mEq once daily.  Check electrolytes and kidney function before next office visit. BP: 142/90 mmHg

## 2014-03-03 NOTE — Progress Notes (Signed)
Pre visit review using our clinic review tool, if applicable. No additional management support is needed unless otherwise documented below in the visit note. 

## 2014-03-03 NOTE — Progress Notes (Signed)
Subjective:    Patient ID: Gina Ryan, female    DOB: [birth year], 58 y.o.   MRN: [id number]  HPI  58 year old African American female with history of hypertension, morbid obesity, status post gastric bypass surgery for followup regarding hypothyroidism and elevated parathyroid hormone levels. Patient's recent TSH at goal. She is currently taking levothyroxine 50 mcg once daily.  Elevated parathyroid hormone levels were discovered during workup of elevated alkaline phosphatase. Patient has low vitamin D levels despite taking vitamin D supplementation. She is currently taking 50,000 units once weekly.  Patient complains of weight gain. Her blood pressure is higher than usual.  Review of Systems She complains of chronic fatigue, she has been tested for sleep apnea in the past (negative)  Negative for chest pain or shortness of breath    Past Medical History  Diagnosis Date  . Hypertension   . Morbid obesity   . GERD (gastroesophageal reflux disease)   . Osteoarthritis, knee   . Atypical chest pain     History   Social History  . Marital Status: Single    Spouse Name: N/A    Number of Children: N/A  . Years of Education: N/A   Occupational History  . [employer]   Social History Main Topics  . Smoking status: Never Smoker   . Smokeless tobacco: Not on file  . Alcohol Use: No  . Drug Use:   . Sexual Activity:    Other Topics Concern  . Not on file   Social History Narrative   Lives alone   Daugher , son and 4 grand children live in the are      Widowed - husband died 39 yrs ago    Past Surgical History  Procedure Laterality Date  . Cholecystectomy    . Total abdominal hysterectomy    . Gastric bypass    . Cosmetic surgery  07/2010    (lower facelift, lower blepharoplasty)    Family History  Problem Relation Age of Onset  . Stroke Mother   . Hypertension Mother   . Cancer Brother     diagnosed in late 43's     Allergies  Allergen Reactions  . Hydrocodone-Acetaminophen     REACTION: Shortness of Breath  . Diovan [Valsartan]     Eye redness    Current Outpatient Prescriptions on File Prior to Visit  Medication Sig Dispense Refill  . estradiol (ESTRACE) 1 MG tablet take 1 tablet by mouth once daily  90 tablet  0  . levothyroxine (SYNTHROID, LEVOTHROID) 50 MCG tablet take 1 tablet by mouth once daily  90 tablet  1  . omeprazole (PRILOSEC) 40 MG capsule take 1 capsule by mouth once daily  90 capsule  1  . Vitamin D, Ergocalciferol, (DRISDOL) 50000 UNITS CAPS capsule Take 1 capsule (50,000 Units total) by mouth every 7 (seven) days.  12 capsule  0   No current facility-administered medications on file prior to visit.    BP 142/90  Pulse 72  Temp(Src) 98.4 F (36.9 C) (Oral)  Ht 5\' 6"  (1.676 m)  Wt 266 lb (120.657 kg)  BMI 42.95 kg/m2    Objective:   Physical Exam  Constitutional: She appears well-developed and well-nourished. No distress.  HENT:  Head: Normocephalic and atraumatic.  Cardiovascular: Normal rate, regular rhythm and normal heart sounds.   Pulmonary/Chest: Effort normal and breath sounds normal. She has no wheezes.  Musculoskeletal:  Trace lower extremity edema bilaterally  Neurological: No  cranial nerve deficit.  Skin: Skin is warm and dry.  Psychiatric: She has a normal mood and affect. Her behavior is normal.          Assessment & Plan:

## 2014-03-03 NOTE — Assessment & Plan Note (Signed)
Patient's parathyroid hormone is elevated but her calcium levels are normal. I suspect slight PTH elevation related to her history of bariatric surgery. Replete vitamin D levels. Patient also to start magnesium citrate supplementation over-the-counter.

## 2014-03-03 NOTE — Assessment & Plan Note (Signed)
Patient's weight gain may be multifactorial. Part of weight gain may be secondary to fluid retention. Start hydrochlorothiazide in addition to losartan 50 mg once daily. Patient also advised to follow 1200-calorie diet and exercise on a regular basis. Her thyroid studies are normal.

## 2014-03-03 NOTE — Assessment & Plan Note (Signed)
Patient likely has autoimmune thyroid disease. Continue levothyroxine 50 mcg once daily. Repeat TSH in 6 months. Lab Results  Component Value Date   TSH 0.62 02/14/2014

## 2014-07-08 ENCOUNTER — Other Ambulatory Visit: Payer: Self-pay | Admitting: Internal Medicine

## 2014-08-09 ENCOUNTER — Other Ambulatory Visit: Payer: Self-pay | Admitting: Internal Medicine

## 2014-08-11 MED ORDER — ESTRADIOL 1 MG PO TABS: ORAL_TABLET | ORAL | Status: DC

## 2014-08-15 ENCOUNTER — Other Ambulatory Visit: Payer: Self-pay

## 2014-09-17 ENCOUNTER — Other Ambulatory Visit: Payer: Self-pay | Admitting: Internal Medicine

## 2014-11-27 ENCOUNTER — Other Ambulatory Visit: Payer: Self-pay | Admitting: Internal Medicine

## 2015-02-22 ENCOUNTER — Encounter (HOSPITAL_COMMUNITY): Payer: Self-pay | Admitting: *Deleted

## 2015-02-22 ENCOUNTER — Emergency Department (HOSPITAL_COMMUNITY)
Admission: EM | Admit: 2015-02-22 | Discharge: 2015-02-22 | Disposition: A | Discharge status: Home / Self Care | Payer: BLUE CROSS/BLUE SHIELD | Attending: Emergency Medicine | Admitting: Emergency Medicine

## 2015-02-22 ENCOUNTER — Emergency Department (HOSPITAL_COMMUNITY): Payer: BLUE CROSS/BLUE SHIELD

## 2015-02-22 DIAGNOSIS — M79602 Pain in left arm: Secondary | ICD-10-CM | POA: Diagnosis not present

## 2015-02-22 DIAGNOSIS — M791 Myalgia, unspecified site: Secondary | ICD-10-CM

## 2015-02-22 DIAGNOSIS — Z793 Long term (current) use of hormonal contraceptives: Secondary | ICD-10-CM | POA: Diagnosis not present

## 2015-02-22 DIAGNOSIS — K219 Gastro-esophageal reflux disease without esophagitis: Secondary | ICD-10-CM | POA: Insufficient documentation

## 2015-02-22 DIAGNOSIS — Z79899 Other long term (current) drug therapy: Secondary | ICD-10-CM | POA: Insufficient documentation

## 2015-02-22 DIAGNOSIS — I10 Essential (primary) hypertension: Secondary | ICD-10-CM | POA: Insufficient documentation

## 2015-02-22 DIAGNOSIS — M1990 Unspecified osteoarthritis, unspecified site: Secondary | ICD-10-CM | POA: Diagnosis not present

## 2015-02-22 LAB — BASIC METABOLIC PANEL
Anion gap: 10 (ref 5–15)
BUN: 14 mg/dL (ref 6–23)
CO2: 26 mmol/L (ref 19–32)
Calcium: 8.6 mg/dL (ref 8.4–10.5)
Chloride: 103 mmol/L (ref 96–112)
Creatinine, Ser: 0.74 mg/dL (ref 0.50–1.10)
GFR calc non Af Amer: >90 mL/min (ref >90)
GLUCOSE: 102 mg/dL — AB (ref 70–99)
POTASSIUM: 4.2 mmol/L (ref 3.5–5.1)
SODIUM: 139 mmol/L (ref 135–145)

## 2015-02-22 LAB — CBC
HCT: 38.1 % (ref 36.0–46.0)
HEMOGLOBIN: 12.1 g/dL (ref 12.0–15.0)
MCH: 27.2 pg (ref 26.0–34.0)
MCHC: 31.8 g/dL (ref 30.0–36.0)
MCV: 85.6 fL (ref 78.0–100.0)
Platelets: 282 10*3/uL (ref 150–400)
RBC: 4.45 MIL/uL (ref 3.87–5.11)
RDW: 14.4 % (ref 11.5–15.5)
WBC: 9 10*3/uL (ref 4.0–10.5)

## 2015-02-22 LAB — I-STAT TROPONIN, ED: TROPONIN I, POC: 0 ng/mL (ref 0.00–0.08)

## 2015-02-22 MED ORDER — NAPROXEN 500 MG PO TABS: 500.0000 mg | ORAL_TABLET | Freq: Two times a day (BID) | ORAL | Status: DC

## 2015-02-22 MED ORDER — OXYCODONE-ACETAMINOPHEN 5-325 MG PO TABS
2.0000 | ORAL_TABLET | ORAL | Status: DC | PRN
Start: 2015-02-22 — End: 2015-06-03

## 2015-02-22 NOTE — ED Provider Notes (Signed)
CSN: [id number]     Arrival date & time 02/22/15  1400 History   First MD Initiated Contact with Patient 02/22/15 1642     Chief Complaint  Patient presents with  . Arm Pain      HPI  Present evaluation of pain in her left arm just below her shoulder. Present for 3-4 days. Does not recall any obvious inciting event. Seems to be more uncomfortable laying down at night. No chest pain. No shortness of breath.  No dyspnea or pain on exertion.. No history of cardiovascular disease. Nonsmoker.  Past Medical History  Diagnosis Date  . Hypertension   . Morbid obesity   . GERD (gastroesophageal reflux disease)   . Osteoarthritis, knee   . Atypical chest pain    Past Surgical History  Procedure Laterality Date  . Cholecystectomy    . Total abdominal hysterectomy    . Gastric bypass    . Cosmetic surgery  07/2010    (lower facelift, lower blepharoplasty)   Family History  Problem Relation Age of Onset  . Stroke Mother   . Hypertension Mother   . Cancer Brother     diagnosed in late 51's   History  Substance Use Topics  . Smoking status: Never Smoker   . Smokeless tobacco: Not on file  . Alcohol Use: No   OB History    No data available     Review of Systems  Constitutional: Negative for fever, chills, diaphoresis, appetite change and fatigue.  HENT: Negative for mouth sores, sore throat and trouble swallowing.   Eyes: Negative for visual disturbance.  Respiratory: Negative for cough, chest tightness, shortness of breath and wheezing.   Cardiovascular: Negative for chest pain.  Gastrointestinal: Negative for nausea, vomiting, abdominal pain, diarrhea and abdominal distention.  Endocrine: Negative for polydipsia, polyphagia and polyuria.  Genitourinary: Negative for dysuria, frequency and hematuria.  Musculoskeletal: Negative for gait problem.       Left humeral pain  Skin: Negative for color change, pallor and rash.  Neurological: Negative for dizziness, syncope,  light-headedness and headaches.  Hematological: Does not bruise/bleed easily.  Psychiatric/Behavioral: Negative for behavioral problems and confusion.      Allergies  Hydrocodone-acetaminophen and Diovan  Home Medications   Prior to Admission medications   Medication Sig Start Date End Date Taking? Authorizing Provider  estradiol (ESTRACE) 1 MG tablet take 1 tablet by mouth once daily 08/11/14   Doe-Hyun R Shawna Orleans, DO  levothyroxine (SYNTHROID, LEVOTHROID) 50 MCG tablet take 1 tablet by mouth once daily 09/17/14   Doe-Hyun R Shawna Orleans, DO  losartan-hydrochlorothiazide (HYZAAR) 50-12.5 MG per tablet take 1 tablet by mouth once daily 11/27/14   Doe-Hyun R Shawna Orleans, DO  naproxen (NAPROSYN) 500 MG tablet Take 1 tablet (500 mg total) by mouth 2 (two) times daily. 02/22/15   Tanna Furry, MD  omeprazole (PRILOSEC) 40 MG capsule take 1 capsule by mouth once daily 08/11/14   Doe-Hyun R Shawna Orleans, DO  oxyCODONE-acetaminophen (PERCOCET/ROXICET) 5-325 MG per tablet Take 2 tablets by mouth every 4 (four) hours as needed for severe pain. 02/22/15   Tanna Furry, MD  potassium chloride (K-DUR,KLOR-CON) 10 MEQ tablet take 1 tablet by mouth once daily 11/27/14   Doe-Hyun R Shawna Orleans, DO  Vitamin D, Ergocalciferol, (DRISDOL) 50000 UNITS CAPS capsule Take 1 capsule (50,000 Units total) by mouth every 7 (seven) days. 02/18/14   Doe-Hyun R Shawna Orleans, DO   BP 117/64 mmHg  Pulse 47  Temp(Src) 97.6 F (36.4 C)  Resp 15  Ht 5\' 6"  (1.676 m)  Wt 268 lb (121.564 kg)  BMI 43.28 kg/m2  SpO2 100% Physical Exam  Constitutional: She is oriented to person, place, and time. She appears well-developed and well-nourished. No distress.  HENT:  Head: Normocephalic.  Eyes: Conjunctivae are normal. Pupils are equal, round, and reactive to light. No scleral icterus.  Neck: Normal range of motion. Neck supple. No thyromegaly present.  Cardiovascular: Normal rate and regular rhythm.  Exam reveals no gallop and no friction rub.   No murmur  heard. Pulmonary/Chest: Effort normal and breath sounds normal. No respiratory distress. She has no wheezes. She has no rales.  Abdominal: Soft. Bowel sounds are normal. She exhibits no distension. There is no tenderness. There is no rebound.  Musculoskeletal: Normal range of motion.       Arms: Tenderness at the deltoid tubercle and with deltoid movement. No pain throat patient at the shoulder near the rotator cuff. Nontender over the supraspinatus or infraspinatus fossa.  Neurological: She is alert and oriented to person, place, and time.  Skin: Skin is warm and dry. No rash noted.  Psychiatric: She has a normal mood and affect. Her behavior is normal.    ED Course  Procedures (including critical care time) Labs Review Labs Reviewed  BASIC METABOLIC PANEL - Abnormal; Notable for the following:    Glucose, Bld 102 (*)    All other components within normal limits  CBC  I-STAT TROPOININ, ED    Imaging Review Dg Humerus Left  02/22/2015   CLINICAL DATA:  Arm pain, no trauma  EXAM: LEFT HUMERUS - 2+ VIEW  COMPARISON:  None.  FINDINGS: There is no evidence of fracture or other focal bone lesions. Soft tissues are unremarkable.  IMPRESSION: Negative.   Electronically Signed   By: Conchita Paris M.D.   On: 02/22/2015 17:39     EKG Interpretation   Date/Time:  Sunday February 22 2015 14:20:11 EDT Ventricular Rate:  77 PR Interval:  164 QRS Duration: 80 QT Interval:  388 QTC Calculation: 439 R Axis:   11 Text Interpretation:  Sinus rhythm with frequent Premature ventricular  complexes Otherwise normal ECG Confirmed by Jeneen Rinks  MD, [place] ([id number]) on  02/22/2015 4:43:54 PM      MDM   Final diagnoses:  Pain of left upper extremity  Muscular pain    Reproducible pain with deltoid movements. Normal humeral x-ray. Normal cardiac enzymes. EKG without acute ischemic changes. This is clearly musculoskeletal. No clear etiology. Does not appear to be rotator cuff. Plan sling, intermittent  ice, limited use of the arm, anti-inflammatories, pain medicine, primary care follow-up if not improving.    Tanna Furry, MD 02/22/15 Drema Halon

## 2015-02-22 NOTE — ED Notes (Signed)
Pt reports dull aching pain to left arm x 1 week. Denies any injury. Pain increases when lying down and now reports a pain to left side. No acute distress noted at triage.

## 2015-02-22 NOTE — ED Notes (Signed)
Pt transported to xray 

## 2015-02-22 NOTE — Discharge Instructions (Signed)

## 2015-04-07 ENCOUNTER — Other Ambulatory Visit: Payer: Self-pay | Admitting: Internal Medicine

## 2015-04-07 MED ORDER — LOSARTAN POTASSIUM-HCTZ 50-12.5 MG PO TABS: 1.0000 | ORAL_TABLET | Freq: Every day | ORAL | Status: DC

## 2015-04-07 MED ORDER — ESTRADIOL 1 MG PO TABS: ORAL_TABLET | ORAL | Status: DC

## 2015-04-07 NOTE — Addendum Note (Signed)
Addended by: Townsend Roger D on: 04/07/2015 01:36 PM   Modules accepted: Orders

## 2015-04-09 ENCOUNTER — Ambulatory Visit: Payer: BLUE CROSS/BLUE SHIELD | Admitting: Adult Health

## 2015-04-09 DIAGNOSIS — Z0289 Encounter for other administrative examinations: Secondary | ICD-10-CM

## 2015-04-13 ENCOUNTER — Other Ambulatory Visit: Payer: Self-pay | Admitting: Internal Medicine

## 2015-04-17 ENCOUNTER — Encounter: Payer: Self-pay | Admitting: Internal Medicine

## 2015-04-17 MED ORDER — LEVOTHYROXINE SODIUM 50 MCG PO TABS: 50.0000 ug | ORAL_TABLET | Freq: Every day | ORAL | Status: DC

## 2015-04-27 ENCOUNTER — Ambulatory Visit: Payer: BLUE CROSS/BLUE SHIELD | Admitting: Adult Health

## 2015-05-29 ENCOUNTER — Ambulatory Visit: Payer: BLUE CROSS/BLUE SHIELD | Admitting: Adult Health

## 2015-06-01 ENCOUNTER — Ambulatory Visit: Payer: BLUE CROSS/BLUE SHIELD | Admitting: Adult Health

## 2015-06-01 DIAGNOSIS — Z0289 Encounter for other administrative examinations: Secondary | ICD-10-CM

## 2015-06-03 ENCOUNTER — Ambulatory Visit (INDEPENDENT_AMBULATORY_CARE_PROVIDER_SITE_OTHER): Payer: BLUE CROSS/BLUE SHIELD | Admitting: Internal Medicine

## 2015-06-03 ENCOUNTER — Other Ambulatory Visit: Payer: Self-pay

## 2015-06-03 ENCOUNTER — Encounter: Payer: Self-pay | Admitting: Internal Medicine

## 2015-06-03 VITALS — BP 120/80 | HR 60 | Temp 98.4°F | Wt 264.0 lb

## 2015-06-03 DIAGNOSIS — E559 Vitamin D deficiency, unspecified: Secondary | ICD-10-CM | POA: Diagnosis not present

## 2015-06-03 DIAGNOSIS — Z1231 Encounter for screening mammogram for malignant neoplasm of breast: Secondary | ICD-10-CM

## 2015-06-03 DIAGNOSIS — R7989 Other specified abnormal findings of blood chemistry: Secondary | ICD-10-CM

## 2015-06-03 DIAGNOSIS — E349 Endocrine disorder, unspecified: Secondary | ICD-10-CM

## 2015-06-03 DIAGNOSIS — I10 Essential (primary) hypertension: Secondary | ICD-10-CM

## 2015-06-03 DIAGNOSIS — R7309 Other abnormal glucose: Secondary | ICD-10-CM | POA: Diagnosis not present

## 2015-06-03 DIAGNOSIS — E039 Hypothyroidism, unspecified: Secondary | ICD-10-CM

## 2015-06-03 LAB — BASIC METABOLIC PANEL
BUN: 19 mg/dL (ref 6–23)
CALCIUM: 9 mg/dL (ref 8.4–10.5)
CO2: 31 meq/L (ref 19–32)
CREATININE: 0.63 mg/dL (ref 0.40–1.20)
Chloride: 103 mEq/L (ref 96–112)
GFR: 124.15 mL/min (ref >60.00)
Glucose, Bld: 92 mg/dL (ref 70–99)
Potassium: 4 mEq/L (ref 3.5–5.1)
Sodium: 139 mEq/L (ref 135–145)

## 2015-06-03 LAB — TSH: TSH: 0.74 u[IU]/mL (ref 0.35–4.50)

## 2015-06-03 LAB — HEMOGLOBIN A1C: HEMOGLOBIN A1C: 5.6 % (ref 4.6–6.5)

## 2015-06-03 LAB — VITAMIN D 25 HYDROXY (VIT D DEFICIENCY, FRACTURES): VITD: 19.31 ng/mL — AB (ref 30.00–100.00)

## 2015-06-03 MED ORDER — LOSARTAN POTASSIUM-HCTZ 50-12.5 MG PO TABS: 1.0000 | ORAL_TABLET | Freq: Every day | ORAL | Status: DC

## 2015-06-03 MED ORDER — VITAMIN D 50 MCG (2000 UT) PO CAPS
ORAL_CAPSULE | ORAL | Status: DC
Start: 2015-06-03 — End: 2015-06-10

## 2015-06-03 MED ORDER — POTASSIUM CHLORIDE CRYS ER 10 MEQ PO TBCR
10.0000 meq | EXTENDED_RELEASE_TABLET | Freq: Every day | ORAL | Status: DC
Start: 2015-06-03 — End: 2017-12-18

## 2015-06-03 NOTE — Progress Notes (Signed)
Subjective:    Patient ID: Gina Ryan, female    DOB: [birth year], 59 y.o.   MRN: [id number]  HPI  59 year old African American female with history of hypertension, hypothyroidism and abnormal parathyroid hormone for routine follow-up. Patient denies significant interval medical history. She ran out of her blood pressure medication 2 days ago.  Patient complains that she has difficulty holding her urine. She denies dysuria or other GU symptoms  Abnormal glucose - weight is stable.  Wt Readings from Last 3 Encounters:  06/03/15 264 lb (119.75 kg)  02/22/15 268 lb (121.564 kg)  03/03/14 266 lb (120.657 kg)     Review of Systems Patient denies history of kidney stones, occasional "skipping" of heart beat    Past Medical History  Diagnosis Date  . Hypertension   . Morbid obesity   . GERD (gastroesophageal reflux disease)   . Osteoarthritis, knee   . Atypical chest pain     History   Social History  . Marital Status: Single    Spouse Name: N/A  . Number of Children: N/A  . Years of Education: N/A   Occupational History  . [employer]   Social History Main Topics  . Smoking status: Never Smoker   . Smokeless tobacco: Not on file  . Alcohol Use: No  . Drug Use: Not on file  . Sexual Activity: Not on file   Other Topics Concern  . Not on file   Social History Narrative   Lives alone   Daugher , son and 4 grand children live in the are      Widowed - husband died 91 yrs ago    Past Surgical History  Procedure Laterality Date  . Cholecystectomy    . Total abdominal hysterectomy    . Gastric bypass    . Cosmetic surgery  07/2010    (lower facelift, lower blepharoplasty)    Family History  Problem Relation Age of Onset  . Stroke Mother   . Hypertension Mother   . Cancer Brother     diagnosed in late 59's    Allergies  Allergen Reactions  . Hydrocodone-Acetaminophen     REACTION: Shortness of Breath  . Diovan  [Valsartan]     Eye redness    Current Outpatient Prescriptions on File Prior to Visit  Medication Sig Dispense Refill  . estradiol (ESTRACE) 1 MG tablet take 1 tablet by mouth once daily 30 tablet 0  . levothyroxine (SYNTHROID, LEVOTHROID) 50 MCG tablet Take 1 tablet (50 mcg total) by mouth daily. 90 tablet 1  . losartan-hydrochlorothiazide (HYZAAR) 50-12.5 MG per tablet Take 1 tablet by mouth daily. 30 tablet 0  . omeprazole (PRILOSEC) 40 MG capsule take 1 capsule by mouth once daily 90 capsule 1  . potassium chloride (K-DUR,KLOR-CON) 10 MEQ tablet take 1 tablet by mouth once daily 90 tablet 0  . Vitamin D, Ergocalciferol, (DRISDOL) 50000 UNITS CAPS capsule Take 1 capsule (50,000 Units total) by mouth every 7 (seven) days. 12 capsule 0  . [DISCONTINUED] potassium chloride (K-DUR) 10 MEQ tablet Take 1 tablet (10 mEq total) by mouth daily. 90 tablet 0   No current facility-administered medications on file prior to visit.    BP 120/80 mmHg  Pulse 60  Temp(Src) 98.4 F (36.9 C) (Oral)  Wt 264 lb (119.75 kg)    Objective:   Physical Exam  Constitutional: She is oriented to person, place, and time. She appears well-developed and well-nourished.  HENT:  Head: Normocephalic and atraumatic.  Eyes: EOM are normal. Pupils are equal, round, and reactive to light.  Neck: Neck supple. No thyromegaly present.  Cardiovascular: Normal rate, regular rhythm and normal heart sounds.   No murmur heard. Pulmonary/Chest: Effort normal and breath sounds normal. She has no wheezes.  Musculoskeletal: She exhibits no edema.  Lymphadenopathy:    She has no cervical adenopathy.  Neurological: She is alert and oriented to person, place, and time. No cranial nerve deficit.  Skin: Skin is warm and dry.  Psychiatric: She has a normal mood and affect. Her behavior is normal.         Assessment & Plan:   1. Hypertension 2. Hypothyroidism 3. Elevated PTH 4. Vitamin D deficiency 5. Abnormal  glucose  Continue same dose of losartan and hydrochlorothiazide. Monitor electrolytes and kidney function.  Monitor thyroid function studies. Adjust levothyroxine dose accordingly.  Repeat parathyroid hormone level.  Patient advised to decrease intake of sweets, sugar beverages and carbohydrates. Monitor hemoglobin A1c.

## 2015-06-03 NOTE — Progress Notes (Signed)
Pre visit review using our clinic review tool, if applicable. No additional management support is needed unless otherwise documented below in the visit note. 

## 2015-06-03 NOTE — Patient Instructions (Addendum)
Proceed with scheduling screening mammogram as directed Avoid sweets, sugary beverages, and decrease your intake of starchy foods

## 2015-06-04 LAB — PTH, INTACT AND CALCIUM
Calcium: 9 mg/dL (ref 8.4–10.5)
PTH: 55 pg/mL (ref 14–64)

## 2015-06-05 ENCOUNTER — Ambulatory Visit
Admission: RE | Admit: 2015-06-05 | Discharge: 2015-06-05 | Disposition: A | Discharge status: Home / Self Care | Payer: BLUE CROSS/BLUE SHIELD | Source: Ambulatory Visit

## 2015-06-05 DIAGNOSIS — Z1231 Encounter for screening mammogram for malignant neoplasm of breast: Secondary | ICD-10-CM

## 2015-06-10 ENCOUNTER — Other Ambulatory Visit: Payer: Self-pay | Admitting: *Deleted

## 2015-06-10 MED ORDER — VITAMIN D 50 MCG (2000 UT) PO CAPS: ORAL_CAPSULE | ORAL | Status: DC

## 2015-06-10 MED ORDER — VITAMIN D (ERGOCALCIFEROL) 1.25 MG (50000 UNIT) PO CAPS: 50000.0000 [IU] | ORAL_CAPSULE | ORAL | Status: DC

## 2015-08-06 ENCOUNTER — Other Ambulatory Visit: Payer: Self-pay | Admitting: Internal Medicine

## 2015-10-20 ENCOUNTER — Other Ambulatory Visit (HOSPITAL_COMMUNITY): Payer: Self-pay | Admitting: Specialist

## 2015-10-20 DIAGNOSIS — M7542 Impingement syndrome of left shoulder: Secondary | ICD-10-CM

## 2015-11-03 ENCOUNTER — Ambulatory Visit (HOSPITAL_COMMUNITY): Payer: BLUE CROSS/BLUE SHIELD

## 2015-11-04 ENCOUNTER — Ambulatory Visit (HOSPITAL_COMMUNITY)
Admission: RE | Admit: 2015-11-04 | Discharge: 2015-11-04 | Disposition: A | Discharge status: Home / Self Care | Payer: BLUE CROSS/BLUE SHIELD | Source: Ambulatory Visit | Attending: Specialist | Admitting: Specialist

## 2015-11-04 DIAGNOSIS — R531 Weakness: Secondary | ICD-10-CM | POA: Insufficient documentation

## 2015-11-04 DIAGNOSIS — M659 Synovitis and tenosynovitis, unspecified: Secondary | ICD-10-CM | POA: Diagnosis not present

## 2015-11-04 DIAGNOSIS — M7542 Impingement syndrome of left shoulder: Secondary | ICD-10-CM | POA: Insufficient documentation

## 2015-11-04 DIAGNOSIS — M19012 Primary osteoarthritis, left shoulder: Secondary | ICD-10-CM | POA: Insufficient documentation

## 2015-11-04 DIAGNOSIS — M75122 Complete rotator cuff tear or rupture of left shoulder, not specified as traumatic: Secondary | ICD-10-CM | POA: Diagnosis not present

## 2016-01-18 ENCOUNTER — Other Ambulatory Visit: Payer: Self-pay | Admitting: *Deleted

## 2016-01-18 MED ORDER — LEVOTHYROXINE SODIUM 50 MCG PO TABS: 50.0000 ug | ORAL_TABLET | Freq: Every day | ORAL | Status: DC

## 2016-01-18 NOTE — Telephone Encounter (Signed)
Rx done. 

## 2016-01-28 ENCOUNTER — Ambulatory Visit (INDEPENDENT_AMBULATORY_CARE_PROVIDER_SITE_OTHER): Payer: BLUE CROSS/BLUE SHIELD | Admitting: Family Medicine

## 2016-01-28 ENCOUNTER — Encounter: Payer: Self-pay | Admitting: Family Medicine

## 2016-01-28 VITALS — BP 124/90 | HR 85 | Temp 98.3°F | Wt 263.4 lb

## 2016-01-28 DIAGNOSIS — E039 Hypothyroidism, unspecified: Secondary | ICD-10-CM | POA: Diagnosis not present

## 2016-01-28 DIAGNOSIS — I10 Essential (primary) hypertension: Secondary | ICD-10-CM | POA: Diagnosis not present

## 2016-01-28 LAB — BASIC METABOLIC PANEL
BUN: 17 mg/dL (ref 6–23)
CALCIUM: 9.1 mg/dL (ref 8.4–10.5)
CHLORIDE: 102 meq/L (ref 96–112)
CO2: 30 meq/L (ref 19–32)
Creatinine, Ser: 0.63 mg/dL (ref 0.40–1.20)
GFR: 123.88 mL/min (ref >60.00)
Glucose, Bld: 96 mg/dL (ref 70–99)
Potassium: 4 mEq/L (ref 3.5–5.1)
Sodium: 138 mEq/L (ref 135–145)

## 2016-01-28 LAB — CBC WITH DIFFERENTIAL/PLATELET
BASOS ABS: 0 10*3/uL (ref 0.0–0.1)
Basophils Relative: 0.5 % (ref 0.0–3.0)
Eosinophils Absolute: 0.3 10*3/uL (ref 0.0–0.7)
Eosinophils Relative: 3.4 % (ref 0.0–5.0)
HCT: 36.6 % (ref 36.0–46.0)
Hemoglobin: 12.1 g/dL (ref 12.0–15.0)
LYMPHS ABS: 2.1 10*3/uL (ref 0.7–4.0)
Lymphocytes Relative: 25.9 % (ref 12.0–46.0)
MCHC: 32.9 g/dL (ref 30.0–36.0)
MCV: 82 fl (ref 78.0–100.0)
MONO ABS: 0.5 10*3/uL (ref 0.1–1.0)
Monocytes Relative: 6.6 % (ref 3.0–12.0)
NEUTROS PCT: 63.6 % (ref 43.0–77.0)
Neutro Abs: 5.1 10*3/uL (ref 1.4–7.7)
Platelets: 305 10*3/uL (ref 150.0–400.0)
RBC: 4.46 Mil/uL (ref 3.87–5.11)
RDW: 14.6 % (ref 11.5–15.5)
WBC: 8 10*3/uL (ref 4.0–10.5)

## 2016-01-28 LAB — TSH: TSH: 0.59 u[IU]/mL (ref 0.35–4.50)

## 2016-01-28 MED ORDER — LOSARTAN POTASSIUM-HCTZ 50-12.5 MG PO TABS: 1.0000 | ORAL_TABLET | Freq: Every day | ORAL | Status: DC

## 2016-01-28 NOTE — Progress Notes (Signed)
Pre visit review using our clinic review tool, if applicable. No additional management support is needed unless otherwise documented below in the visit note. 

## 2016-01-28 NOTE — Patient Instructions (Signed)
Please go to lab for blood work before you leave and lab results will be communicated with you within one week or sooner if needed.  Monitor BP at home and bring readings with you to your next visit to establish care and routine wellness management.  DASH Eating Plan DASH stands for "Dietary Approaches to Stop Hypertension." The DASH eating plan is a healthy eating plan that has been shown to reduce high blood pressure (hypertension). Additional health benefits may include reducing the risk of type 2 diabetes mellitus, heart disease, and stroke. The DASH eating plan may also help with weight loss. WHAT DO I NEED TO KNOW ABOUT THE DASH EATING PLAN? For the DASH eating plan, you will follow these general guidelines:  Choose foods with a percent daily value for sodium of less than 5% (as listed on the food label).  Use salt-free seasonings or herbs instead of table salt or sea salt.  Check with your health care provider or pharmacist before using salt substitutes.  Eat lower-sodium products, often labeled as "lower sodium" or "no salt added."  Eat fresh foods.  Eat more vegetables, fruits, and low-fat dairy products.  Choose whole grains. Look for the word "whole" as the first word in the ingredient list.  Choose fish and skinless chicken or [place] more often than red meat. Limit fish, poultry, and meat to 6 oz (170 g) each day.  Limit sweets, desserts, sugars, and sugary drinks.  Choose heart-healthy fats.  Limit cheese to 1 oz (28 g) per day.  Eat more home-cooked food and less restaurant, buffet, and fast food.  Limit fried foods.  Cook foods using methods other than frying.  Limit canned vegetables. If you do use them, rinse them well to decrease the sodium.  When eating at a restaurant, ask that your food be prepared with less salt, or no salt if possible. WHAT FOODS CAN I EAT? Seek help from a dietitian for individual calorie needs. Grains Whole grain or whole wheat  bread. Brown rice. Whole grain or whole wheat pasta. Quinoa, bulgur, and whole grain cereals. Low-sodium cereals. Corn or whole wheat flour tortillas. Whole grain cornbread. Whole grain crackers. Low-sodium crackers. Vegetables Fresh or frozen vegetables (raw, steamed, roasted, or grilled). Low-sodium or reduced-sodium tomato and vegetable juices. Low-sodium or reduced-sodium tomato sauce and paste. Low-sodium or reduced-sodium canned vegetables.  Fruits All fresh, canned (in natural juice), or frozen fruits. Meat and Other Protein Products Ground beef (85% or leaner), grass-fed beef, or beef trimmed of fat. Skinless chicken or [place]. Ground chicken or [place]. Pork trimmed of fat. All fish and seafood. Eggs. Dried beans, peas, or lentils. Unsalted nuts and seeds. Unsalted canned beans. Dairy Low-fat dairy products, such as skim or 1% milk, 2% or reduced-fat cheeses, low-fat ricotta or cottage cheese, or plain low-fat yogurt. Low-sodium or reduced-sodium cheeses. Fats and Oils Tub margarines without trans fats. Light or reduced-fat mayonnaise and salad dressings (reduced sodium). Avocado. Safflower, olive, or canola oils. Natural peanut or almond butter. Other Unsalted popcorn and pretzels. The items listed above may not be a complete list of recommended foods or beverages. Contact your dietitian for more options. WHAT FOODS ARE NOT RECOMMENDED? Grains White bread. White pasta. White rice. Refined cornbread. Bagels and croissants. Crackers that contain trans fat. Vegetables Creamed or fried vegetables. Vegetables in a cheese sauce. Regular canned vegetables. Regular canned tomato sauce and paste. Regular tomato and vegetable juices. Fruits Dried fruits. Canned fruit in light or heavy syrup. Fruit juice. Meat  and Other Protein Products Fatty cuts of meat. Ribs, chicken wings, bacon, sausage, bologna, salami, chitterlings, fatback, hot dogs, bratwurst, and packaged luncheon meats. Salted nuts and  seeds. Canned beans with salt. Dairy Whole or 2% milk, cream, half-and-half, and cream cheese. Whole-fat or sweetened yogurt. Full-fat cheeses or blue cheese. Nondairy creamers and whipped toppings. Processed cheese, cheese spreads, or cheese curds. Condiments Onion and garlic salt, seasoned salt, table salt, and sea salt. Canned and packaged gravies. Worcestershire sauce. Tartar sauce. Barbecue sauce. Teriyaki sauce. Soy sauce, including reduced sodium. Steak sauce. Fish sauce. Oyster sauce. Cocktail sauce. Horseradish. Ketchup and mustard. Meat flavorings and tenderizers. Bouillon cubes. Hot sauce. Tabasco sauce. Marinades. Taco seasonings. Relishes. Fats and Oils Butter, stick margarine, lard, shortening, ghee, and bacon fat. Coconut, palm kernel, or palm oils. Regular salad dressings. Other Pickles and olives. Salted popcorn and pretzels. The items listed above may not be a complete list of foods and beverages to avoid. Contact your dietitian for more information. WHERE CAN I FIND MORE INFORMATION? National Heart, Lung, and Blood Institute: travelstabloid.com   This information is not intended to replace advice given to you by your health care provider. Make sure you discuss any questions you have with your health care provider.   Document Released: 10/06/2011 Document Revised: 11/07/2014 Document Reviewed: 08/21/2013 Elsevier Interactive Patient Education [employer].

## 2016-01-28 NOTE — Progress Notes (Addendum)
Subjective:    Patient ID: Gina Ryan, female    DOB: 04/22/1956, 60 y.o.   MRN: [id number]  HPI  [name] is a 60 year old female who presents today for refills of her medications and follow up for treatment regimen. I am seeing this patient today as her PCP is unavailable at this time.  Hypertension Currently managed with losartan-hydrochlorothiazide without adverse effects reported. Patient states that she may "forget" her medication but otherwise takes this daily in the morning. She reports taking this medication today. She monitors BP at home every 2 months.and states that systolic average is noted as [id number] and diastolic is noted to be in the 80s. Limits salt but states that she eats at restaurants every week and would like to work on improving her dietary habits and improving her exercise regimen. No specific exercise regimen noted.  Patient discussed weight loss and attempting to watch portion sizes in her diet.   Hypothyroidism Denies heat/cold intolerance, bowel changes, or changes in hair, skin, and nails. Maintained on her current levothyroxine dose.   [facility] follows patient for recent rotator cuff surgery on her Left shoulder. She reports limited pain/discomfort and she has a plan in place for PT.  Today, patient stated that she has been taking her daughter's adderall XR over the past 5 years. She would not verify how often she has been taking the medication other than she did not take it on a daily basis. She reports stopping this medication on 01/01/2016 due to rotator cuff surgery and noticed after 3 weeks that she had trouble sleeping or felt anxious at times. She requested this medication to assist with "stopping" the medication long term. Today, she denied any chest pain, palpitations, anxious mood, or SOB. Patient was advised that this medication has high abuse potential and should not be distributed to others and can cause serious cardiovascular adverse  effects and sudden death with misuse. Also, she was further advised that she was successful in stopping this medication at the beginning of the month and that additional adderrall would not be provided to her.   Review of Systems  Constitutional: Negative for fever, chills and fatigue.  HENT: Negative for congestion, nosebleeds, postnasal drip, rhinorrhea, sinus pressure, sneezing and sore throat.   Eyes: Negative for visual disturbance.  Respiratory: Negative for cough, shortness of breath and wheezing.   Cardiovascular: Negative for chest pain, palpitations and leg swelling.  Gastrointestinal: Negative for nausea, vomiting, abdominal pain, diarrhea and constipation.  Genitourinary: Negative for dysuria, urgency, frequency, hematuria and flank pain.  Musculoskeletal: Negative for myalgias.  Skin: Negative for rash.  Neurological: Negative for dizziness, light-headedness, numbness and headaches.  Psychiatric/Behavioral:       Denies depression or anxiety   Past Medical History  Diagnosis Date  . Hypertension   . Morbid obesity ([place])   . GERD (gastroesophageal reflux disease)   . Osteoarthritis, knee   . Atypical chest pain     Social History   Social History  . Marital Status: Single    Spouse Name: N/A  . Number of Children: N/A  . Years of Education: N/A   Occupational History  . [employer]   Social History Main Topics  . Smoking status: Never Smoker   . Smokeless tobacco: Not on file  . Alcohol Use: No  . Drug Use: Not on file  . Sexual Activity: Not on file   Other Topics Concern  . Not on file  Social History Narrative   Lives alone   Daugher , son and 4 grand children live in the are      Widowed - husband died 64 yrs ago    Past Surgical History  Procedure Laterality Date  . Cholecystectomy    . Total abdominal hysterectomy    . Gastric bypass    . Cosmetic surgery  07/2010    (lower facelift, lower blepharoplasty)     Family History  Problem Relation Age of Onset  . Stroke Mother   . Hypertension Mother   . Cancer Brother     diagnosed in late 79's    Allergies  Allergen Reactions  . Hydrocodone-Acetaminophen     REACTION: Shortness of Breath  . Diovan [Valsartan]     Eye redness. No current issues with losartan    Current Outpatient Prescriptions on File Prior to Visit  Medication Sig Dispense Refill  . Cholecalciferol (VITAMIN D) 2000 UNITS CAPS Take one capsule by mouth once daily 30 capsule 3  . estradiol (ESTRACE) 1 MG tablet take 1 tablet by mouth once daily 30 tablet 5  . levothyroxine (SYNTHROID, LEVOTHROID) 50 MCG tablet Take 1 tablet (50 mcg total) by mouth daily. 90 tablet 0  . losartan-hydrochlorothiazide (HYZAAR) 50-12.5 MG per tablet Take 1 tablet by mouth daily. 90 tablet 1  . omeprazole (PRILOSEC) 40 MG capsule take 1 capsule by mouth once daily 90 capsule 1  . potassium chloride (K-DUR,KLOR-CON) 10 MEQ tablet Take 1 tablet (10 mEq total) by mouth daily. 90 tablet 1  . traMADol (ULTRAM) 50 MG tablet take 1 tablet by mouth every 6 to 8 hours if needed for pain  0  . Vitamin D, Ergocalciferol, (DRISDOL) 50000 UNITS CAPS capsule Take 1 capsule (50,000 Units total) by mouth every 7 (seven) days. 30 capsule 0  . [DISCONTINUED] potassium chloride (K-DUR) 10 MEQ tablet Take 1 tablet (10 mEq total) by mouth daily. 90 tablet 0   No current facility-administered medications on file prior to visit.    BP 124/90 mmHg  Pulse 85  Temp(Src) 98.3 F (36.8 C) (Oral)  Wt 263 lb 6.4 oz (119.477 kg)      Objective:   Physical Exam  Constitutional: She is oriented to person, place, and time. She appears well-developed and well-nourished.  Obese  Eyes: Pupils are equal, round, and reactive to light.  Neck: Neck supple.  Cardiovascular: Normal rate and regular rhythm.  Exam reveals no gallop and no friction rub.   No murmur heard. Pulmonary/Chest: Effort normal and breath sounds  normal. She has no wheezes. She has no rales.  Abdominal: Soft. Bowel sounds are normal. There is no tenderness.  Musculoskeletal:  Left arm in sling post rotator cuff surgery. No pain/discomfort noted at this time.   Lymphadenopathy:    She has no cervical adenopathy.  Neurological: She is alert and oriented to person, place, and time. Coordination normal.  No decreased sensation noted in hands bilaterally  Skin: Skin is warm and dry. No rash noted.  Psychiatric: She has a normal mood and affect. Her behavior is normal.      Assessment & Plan:  1. Essential hypertension Advised patient to monitor BP at home and bring readings with her to her visit to establish care and routine wellness management. - CBC with Differential/Platelet - Basic metabolic panel - losartan-hydrochlorothiazide (HYZAAR) 50-12.5 MG tablet; Take 1 tablet by mouth daily.  Dispense: 90 tablet; Refill: 1  2. Hypothyroidism, unspecified hypothyroidism type Advised patient to  remain on current levothyroxine dose and and changes needed after lab results are reviewed will be communicated with her. - TSH  Advised patient to establish care with a PCP within 3 months or sooner for routine wellness management and medication refills. Patient was advised that adderall has high abuse potential and should not be distributed to others and can cause serious cardiovascular adverse effects and sudden death with misuse. Also, she was further advised that she was successful in stopping this medication at the beginning of the month and that additional adderrall would not be provided to her.  I spent 25 minutes with this patient and greater than 50% of the time was spent in face to face counseling.

## 2016-02-15 ENCOUNTER — Telehealth: Payer: Self-pay | Admitting: *Deleted

## 2016-02-15 NOTE — Telephone Encounter (Signed)
-----   Message from Morley, DO sent at 02/11/2016  3:42 PM EDT ----- Please document this in her chart for the next provider who will take care of her to see.    RY ----- Message -----    From: Delano Metz, FNP    Sent: 02/08/2016   3:32 PM      To: Rosine Abe, DO  Hello Dr. Shawna Orleans,  I wanted to send this to you as a FYI as this patient stated that she was taking her daughter's adderall for the past 5 years. Requested this from me but did not push it when I declined to do so.   Just wanted to keep you in the loop since she you are her provider, Almyra Free

## 2016-02-15 NOTE — Telephone Encounter (Signed)
BE ADVISED OF NOTE BELOW

## 2016-07-07 ENCOUNTER — Encounter: Payer: Self-pay | Admitting: Family Medicine

## 2016-07-07 ENCOUNTER — Ambulatory Visit (INDEPENDENT_AMBULATORY_CARE_PROVIDER_SITE_OTHER): Payer: BLUE CROSS/BLUE SHIELD | Admitting: Family Medicine

## 2016-07-07 VITALS — BP 140/76 | Temp 98.0°F | Ht 66.0 in | Wt 249.7 lb

## 2016-07-07 DIAGNOSIS — I10 Essential (primary) hypertension: Secondary | ICD-10-CM | POA: Diagnosis not present

## 2016-07-07 DIAGNOSIS — E215 Disorder of parathyroid gland, unspecified: Secondary | ICD-10-CM

## 2016-07-07 DIAGNOSIS — E039 Hypothyroidism, unspecified: Secondary | ICD-10-CM | POA: Diagnosis not present

## 2016-07-07 DIAGNOSIS — K219 Gastro-esophageal reflux disease without esophagitis: Secondary | ICD-10-CM | POA: Diagnosis not present

## 2016-07-07 DIAGNOSIS — Z8639 Personal history of other endocrine, nutritional and metabolic disease: Secondary | ICD-10-CM

## 2016-07-07 LAB — CBC WITH DIFFERENTIAL/PLATELET
Basophils Absolute: 0.1 10*3/uL (ref 0.0–0.1)
Basophils Relative: 0.7 % (ref 0.0–3.0)
EOS PCT: 1.7 % (ref 0.0–5.0)
Eosinophils Absolute: 0.1 10*3/uL (ref 0.0–0.7)
HEMATOCRIT: 37 % (ref 36.0–46.0)
HEMOGLOBIN: 12 g/dL (ref 12.0–15.0)
LYMPHS ABS: 1.9 10*3/uL (ref 0.7–4.0)
LYMPHS PCT: 25.2 % (ref 12.0–46.0)
MCHC: 32.4 g/dL (ref 30.0–36.0)
MCV: 83.2 fl (ref 78.0–100.0)
MONOS PCT: 7.3 % (ref 3.0–12.0)
Monocytes Absolute: 0.6 10*3/uL (ref 0.1–1.0)
NEUTROS PCT: 65.1 % (ref 43.0–77.0)
Neutro Abs: 5 10*3/uL (ref 1.4–7.7)
Platelets: 266 10*3/uL (ref 150.0–400.0)
RBC: 4.45 Mil/uL (ref 3.87–5.11)
RDW: 15.1 % (ref 11.5–15.5)
WBC: 7.7 10*3/uL (ref 4.0–10.5)

## 2016-07-07 LAB — BASIC METABOLIC PANEL
BUN: 19 mg/dL (ref 6–23)
CALCIUM: 8.7 mg/dL (ref 8.4–10.5)
CO2: 29 meq/L (ref 19–32)
CREATININE: 0.62 mg/dL (ref 0.40–1.20)
Chloride: 103 mEq/L (ref 96–112)
GFR: 126 mL/min (ref >60.00)
GLUCOSE: 93 mg/dL (ref 70–99)
Potassium: 3.9 mEq/L (ref 3.5–5.1)
Sodium: 139 mEq/L (ref 135–145)

## 2016-07-07 LAB — HEPATIC FUNCTION PANEL
ALK PHOS: 115 U/L (ref 39–117)
ALT: 16 U/L (ref 0–35)
AST: 18 U/L (ref 0–37)
Albumin: 3.8 g/dL (ref 3.5–5.2)
BILIRUBIN DIRECT: 0.1 mg/dL (ref 0.0–0.3)
BILIRUBIN TOTAL: 0.3 mg/dL (ref 0.2–1.2)
Total Protein: 6.9 g/dL (ref 6.0–8.3)

## 2016-07-07 LAB — LIPID PANEL
CHOL/HDL RATIO: 2
Cholesterol: 187 mg/dL (ref 0–200)
HDL: 84.3 mg/dL (ref >39.00)
LDL Cholesterol: 91 mg/dL (ref 0–99)
NONHDL: 102.97
Triglycerides: 58 mg/dL (ref 0.0–149.0)
VLDL: 11.6 mg/dL (ref 0.0–40.0)

## 2016-07-07 LAB — VITAMIN D 25 HYDROXY (VIT D DEFICIENCY, FRACTURES): VITD: 21.94 ng/mL — ABNORMAL LOW (ref 30.00–100.00)

## 2016-07-07 LAB — TSH: TSH: 1.22 u[IU]/mL (ref 0.35–4.50)

## 2016-07-07 MED ORDER — LEVOTHYROXINE SODIUM 50 MCG PO TABS: 50.0000 ug | ORAL_TABLET | Freq: Every day | ORAL | 0 refills | Status: DC

## 2016-07-07 NOTE — Patient Instructions (Signed)
We have ordered labs or studies at this visit. It can take up to 1-2 weeks for results and processing. IF results require follow up or explanation, we will call you with instructions. Clinically stable results will be released to your [facility]. If you have not heard from [place] or cannot find your results in [facility] in 2 weeks please contact our office at [phone].  If you are not yet signed up for [facility], please consider signing up   [place] work on [employer]!  Keep up the great work! Follow up in 6 months or sooner if needed.

## 2016-07-07 NOTE — Progress Notes (Signed)
Pre visit review using our clinic review tool, if applicable. No additional management support is needed unless otherwise documented below in the visit note. 

## 2016-07-07 NOTE — Progress Notes (Addendum)
Subjective:    Patient ID: Gina Ryan, female    DOB: October 24, 1956, 60 y.o.   MRN: [id number]  HPI  Gina Ryan is a 60 year old female who is here today to follow up on her chronic medical conditions and is requesting surgical clearance for a left total knee replacement.  HTN;  She reports monitoring her BP at home and reports average BP range systolic Q000111Q and diastolic ranges in the Q000111Q. She denies chest pain, palpitations, headaches, nosebleed, dizziness, numbness, tingling, weakness, and claudication.  Hypothyroidism:  Patient states that she "ran out" of her levothyroxine and states that she stopped taking it 4 days ago. She denies change in hair, skin, and nails, change in bowels, or intolerance to heat and cold.    GERD: Prilosec is used as needed and she states that it has been over 6 months since she has taken this medication. She denies reflux, dysphagia, melena, hematochezia, and small caliber stools. She reports that this is due to a change in her diet and avoidance of food triggers. She also is currently trying to lose weight. Wt Readings from Last 3 Encounters:  07/07/16 249 lb 11.2 oz (113.3 kg)  01/28/16 263 lb 6.4 oz (119.5 kg)  11/04/15 263 lb (119.3 kg)   Left Knee pain:  She is followed by [facility] orthopedics and is planning to have a TKA and reports using meloxicam and Tylenol # 3 which has managed her pain. She states Tylenol 3 on an as needed basis. She uses ice packs which has provided excellent benefit.  Past Medical History:  Diagnosis Date  . Atypical chest pain   . GERD (gastroesophageal reflux disease)   . Hypertension   . Morbid obesity ([place])   . Osteoarthritis, knee      Social History   Social History  . Marital status: Single    Spouse name: N/A  . Number of children: N/A  . Years of education: N/A   Occupational History  . [employer]   Social History Main Topics  . Smoking status: Never Smoker  .  Smokeless tobacco: Not on file  . Alcohol use No  . Drug use: Unknown  . Sexual activity: Not on file   Other Topics Concern  . Not on file   Social History Narrative   Lives alone   Daugher , son and 4 grand children live in the are      Widowed - husband died 46 yrs ago    Past Surgical History:  Procedure Laterality Date  . CHOLECYSTECTOMY    . COSMETIC SURGERY  07/2010   (lower facelift, lower blepharoplasty)  . GASTRIC BYPASS    . TOTAL ABDOMINAL HYSTERECTOMY      Family History  Problem Relation Age of Onset  . Stroke Mother   . Hypertension Mother   . Cancer Brother     diagnosed in late 86's    Allergies  Allergen Reactions  . Hydrocodone-Acetaminophen     REACTION: Shortness of Breath  . Diovan [Valsartan]     Eye redness. No current issues with losartan    Current Outpatient Prescriptions on File Prior to Visit  Medication Sig Dispense Refill  . acetaminophen-codeine (TYLENOL #3) 300-30 MG tablet take 1 tablet by mouth every 4 to 6 hours if needed for pain  0  . losartan-hydrochlorothiazide (HYZAAR) 50-12.5 MG tablet Take 1 tablet by mouth daily. 90 tablet 1  . omeprazole (PRILOSEC) 40 MG capsule take  1 capsule by mouth once daily 90 capsule 1  . celecoxib (CELEBREX) 200 MG capsule   0  . Cholecalciferol (VITAMIN D) 2000 UNITS CAPS Take one capsule by mouth once daily (Patient not taking: Reported on 07/07/2016) 30 capsule 3  . estradiol (ESTRACE) 1 MG tablet take 1 tablet by mouth once daily (Patient not taking: Reported on 07/07/2016) 30 tablet 5  . potassium chloride (K-DUR,KLOR-CON) 10 MEQ tablet Take 1 tablet (10 mEq total) by mouth daily. (Patient not taking: Reported on 07/07/2016) 90 tablet 1  . traMADol (ULTRAM) 50 MG tablet take 1 tablet by mouth every 6 to 8 hours if needed for pain  0  . Vitamin D, Ergocalciferol, (DRISDOL) 50000 UNITS CAPS capsule Take 1 capsule (50,000 Units total) by mouth every 7 (seven) days. (Patient not taking: Reported on  07/07/2016) 30 capsule 0  . [DISCONTINUED] potassium chloride (K-DUR) 10 MEQ tablet Take 1 tablet (10 mEq total) by mouth daily. 90 tablet 0   No current facility-administered medications on file prior to visit.     BP 140/76 (BP Location: Left Arm, Patient Position: Sitting, Cuff Size: Large)   Temp 98 F (36.7 C) (Oral)   Ht 5\' 6"  (1.676 m)   Wt 249 lb 11.2 oz (113.3 kg)   SpO2 97%   BMI 40.30 kg/m     Review of Systems  Constitutional: Negative for chills and fever.  Eyes: Negative for visual disturbance.  Respiratory: Negative for cough, shortness of breath and wheezing.   Cardiovascular: Negative for chest pain and palpitations.  Gastrointestinal: Negative for abdominal pain, constipation, diarrhea, nausea and vomiting.  Endocrine: Negative for polydipsia, polyphagia and polyuria.  Genitourinary: Negative for dysuria, frequency and hematuria.  Musculoskeletal: Positive for arthralgias and joint swelling.  Skin: Negative for rash.  Neurological: Negative for dizziness, light-headedness, numbness and headaches.  Psychiatric/Behavioral:       Denies depression or anxiety   Past Medical History:  Diagnosis Date  . Atypical chest pain   . GERD (gastroesophageal reflux disease)   . Hypertension   . Morbid obesity ([place])   . Osteoarthritis, knee      Social History   Social History  . Marital status: Single    Spouse name: N/A  . Number of children: N/A  . Years of education: N/A   Occupational History  . [employer]   Social History Main Topics  . Smoking status: Never Smoker  . Smokeless tobacco: Not on file  . Alcohol use No  . Drug use: Unknown  . Sexual activity: Not on file   Other Topics Concern  . Not on file   Social History Narrative   Lives alone   Daugher , son and 4 grand children live in the are      Widowed - husband died 23 yrs ago    Past Surgical History:  Procedure Laterality Date  . CHOLECYSTECTOMY      . COSMETIC SURGERY  07/2010   (lower facelift, lower blepharoplasty)  . GASTRIC BYPASS    . TOTAL ABDOMINAL HYSTERECTOMY      Family History  Problem Relation Age of Onset  . Stroke Mother   . Hypertension Mother   . Cancer Brother     diagnosed in late 80's    Allergies  Allergen Reactions  . Hydrocodone-Acetaminophen     REACTION: Shortness of Breath  . Diovan [Valsartan]     Eye redness. No current issues with losartan  Current Outpatient Prescriptions on File Prior to Visit  Medication Sig Dispense Refill  . acetaminophen-codeine (TYLENOL #3) 300-30 MG tablet take 1 tablet by mouth every 4 to 6 hours if needed for pain  0  . levothyroxine (SYNTHROID, LEVOTHROID) 50 MCG tablet Take 1 tablet (50 mcg total) by mouth daily. 90 tablet 0  . losartan-hydrochlorothiazide (HYZAAR) 50-12.5 MG tablet Take 1 tablet by mouth daily. 90 tablet 1  . omeprazole (PRILOSEC) 40 MG capsule take 1 capsule by mouth once daily 90 capsule 1  . celecoxib (CELEBREX) 200 MG capsule   0  . Cholecalciferol (VITAMIN D) 2000 UNITS CAPS Take one capsule by mouth once daily (Patient not taking: Reported on 07/07/2016) 30 capsule 3  . estradiol (ESTRACE) 1 MG tablet take 1 tablet by mouth once daily (Patient not taking: Reported on 07/07/2016) 30 tablet 5  . potassium chloride (K-DUR,KLOR-CON) 10 MEQ tablet Take 1 tablet (10 mEq total) by mouth daily. (Patient not taking: Reported on 07/07/2016) 90 tablet 1  . traMADol (ULTRAM) 50 MG tablet take 1 tablet by mouth every 6 to 8 hours if needed for pain  0  . Vitamin D, Ergocalciferol, (DRISDOL) 50000 UNITS CAPS capsule Take 1 capsule (50,000 Units total) by mouth every 7 (seven) days. (Patient not taking: Reported on 07/07/2016) 30 capsule 0  . [DISCONTINUED] potassium chloride (K-DUR) 10 MEQ tablet Take 1 tablet (10 mEq total) by mouth daily. 90 tablet 0   No current facility-administered medications on file prior to visit.     BP 140/76 (BP Location: Left Arm,  Patient Position: Sitting, Cuff Size: Large)   Temp 98 F (36.7 C) (Oral)   Ht 5\' 6"  (1.676 m)   Wt 249 lb 11.2 oz (113.3 kg)   SpO2 97%   BMI 40.30 kg/m       Objective:   Physical Exam  Constitutional: She is oriented to person, place, and time. She appears well-developed and well-nourished.  Eyes: Pupils are equal, round, and reactive to light. No scleral icterus.  Neck: Normal range of motion.  Cardiovascular: Normal rate, regular rhythm, normal heart sounds and intact distal pulses.  Exam reveals no friction rub.   No murmur heard. Pulmonary/Chest: Effort normal and breath sounds normal. She has no wheezes.  Abdominal: Soft. Bowel sounds are normal. There is no tenderness.  Musculoskeletal:  Mild edema present in knees bilaterally  Lymphadenopathy:    She has no cervical adenopathy.  Neurological: She is alert and oriented to person, place, and time.  Skin: Skin is warm and dry. No rash noted.  Psychiatric: She has a normal mood and affect. Her behavior is normal. Judgment and thought content normal.        Assessment & Plan:  1. Essential hypertension Controlled; continue same dose of losartan and hydrochlorothiazide. Will monitor kidney function and electrolytes. EKG reviewed with cardiologist on call at heart care due to artifact that was present. EKG noted NSR with visible p waves in lead 3. Patient has pendulous breast and this may have interfered with placement of leads.  No further follow up recommended from cardiology based upon EKG.  - EKG 12-Lead - CBC with Differential/Platelet - Basic metabolic panel - Lipid panel - Hepatic function panel  2. Gastroesophageal reflux disease without esophagitis Continue prilosec as needed and continue with avoidance of triggers.  Encouraged continued weight loss as patient has a goal to lose 50 lbs at this time.   3. Hypothyroidism, unspecified hypothyroidism type Monitor thyroid function results  and will adjust  levothyroxine as needed. Provided a refill for patient and advised her to resume her daily dose at this time.  - TSH  4. History of vitamin D deficiency Repeat Vitamin D level as patient is no longer taking a Vitamin D supplement.  Will advised based upon results.  - VITAMIN D 25 Hydroxy (Vit-D Deficiency, Fractures)  5. Parathyroid abnormality (HCC) Repeat PTH level for evaluation due to history of abnormal elevation of PTH level.   - PTH, Intact and Calcium  Patient encouraged to continue on weight loss regimen and dietary changes.   Advised her to establish care with a PCP for routine care and monitoring. She should follow up in 6 months or sooner if needed.  Delano Metz, FNP-C

## 2016-07-08 LAB — PTH, INTACT AND CALCIUM
CALCIUM: 9 mg/dL (ref 8.6–10.4)
PTH: 68 pg/mL — ABNORMAL HIGH (ref 14–64)

## 2016-07-11 ENCOUNTER — Other Ambulatory Visit: Payer: Self-pay | Admitting: Family Medicine

## 2016-07-11 DIAGNOSIS — R7989 Other specified abnormal findings of blood chemistry: Secondary | ICD-10-CM

## 2016-07-11 MED ORDER — VITAMIN D (ERGOCALCIFEROL) 1.25 MG (50000 UNIT) PO CAPS: 50000.0000 [IU] | ORAL_CAPSULE | ORAL | 0 refills | Status: DC

## 2016-07-12 ENCOUNTER — Other Ambulatory Visit: Payer: Self-pay | Admitting: Family Medicine

## 2016-07-12 ENCOUNTER — Telehealth: Payer: Self-pay | Admitting: Family Medicine

## 2016-07-12 NOTE — Telephone Encounter (Signed)
Spoke with pt and yes she going to keep Gregary Signs as her PCP. Pt verbalized understanding and will follow up when her annual is due.

## 2016-07-12 NOTE — Telephone Encounter (Signed)
Called and spoke with pt. Pt states that on my chart is say's she is need of Heb C screening." I informed pt that it is overdue health maintenance and can be disccuessed at her next appointment with Gregary Signs. Pt verbalized understanding.   It this okay? Or is an appointment needed before 10/13/16 to discuss this concern. Pt verbalized that she has not been exposed.

## 2016-07-12 NOTE — Telephone Encounter (Signed)
Pt would like hep c screening please put order in system

## 2016-07-12 NOTE — Telephone Encounter (Signed)
Will contact pt and make her aware.

## 2016-07-12 NOTE — Telephone Encounter (Signed)
These screenings are conducted at annual exams and since I saw her for her chronic conditions and she mentioned possibly establishing care at another clinic they were not completed at that time. If she has decided to establish care with me, we can complete these at her next routine exam.

## 2016-08-25 ENCOUNTER — Other Ambulatory Visit: Payer: Self-pay | Admitting: Family Medicine

## 2016-08-25 DIAGNOSIS — I10 Essential (primary) hypertension: Secondary | ICD-10-CM

## 2016-09-02 ENCOUNTER — Ambulatory Visit: Payer: Self-pay | Admitting: Orthopedic Surgery

## 2016-09-15 DIAGNOSIS — Z8719 Personal history of other diseases of the digestive system: Secondary | ICD-10-CM | POA: Insufficient documentation

## 2016-09-15 DIAGNOSIS — Z9884 Bariatric surgery status: Secondary | ICD-10-CM | POA: Insufficient documentation

## 2016-09-20 DIAGNOSIS — K9509 Other complications of gastric band procedure: Secondary | ICD-10-CM | POA: Insufficient documentation

## 2016-09-27 ENCOUNTER — Encounter (HOSPITAL_COMMUNITY): Payer: Self-pay | Admitting: *Deleted

## 2016-10-13 ENCOUNTER — Ambulatory Visit: Payer: BLUE CROSS/BLUE SHIELD | Admitting: Family Medicine

## 2016-10-17 ENCOUNTER — Ambulatory Visit (INDEPENDENT_AMBULATORY_CARE_PROVIDER_SITE_OTHER): Payer: BLUE CROSS/BLUE SHIELD | Admitting: Family Medicine

## 2016-10-17 ENCOUNTER — Encounter: Payer: Self-pay | Admitting: Family Medicine

## 2016-10-17 VITALS — BP 116/72 | HR 64 | Temp 97.5°F | Wt 260.2 lb

## 2016-10-17 DIAGNOSIS — E039 Hypothyroidism, unspecified: Secondary | ICD-10-CM

## 2016-10-17 DIAGNOSIS — E559 Vitamin D deficiency, unspecified: Secondary | ICD-10-CM

## 2016-10-17 DIAGNOSIS — I10 Essential (primary) hypertension: Secondary | ICD-10-CM

## 2016-10-17 DIAGNOSIS — K219 Gastro-esophageal reflux disease without esophagitis: Secondary | ICD-10-CM

## 2016-10-17 DIAGNOSIS — L309 Dermatitis, unspecified: Secondary | ICD-10-CM

## 2016-10-17 DIAGNOSIS — E349 Endocrine disorder, unspecified: Secondary | ICD-10-CM

## 2016-10-17 LAB — VITAMIN D 25 HYDROXY (VIT D DEFICIENCY, FRACTURES): VITD: 28.94 ng/mL — ABNORMAL LOW (ref 30.00–100.00)

## 2016-10-17 LAB — TSH: TSH: 0.48 u[IU]/mL (ref 0.35–4.50)

## 2016-10-17 MED ORDER — TRIAMCINOLONE ACETONIDE 0.025 % EX OINT: 1.0000 "application " | TOPICAL_OINTMENT | Freq: Two times a day (BID) | CUTANEOUS | 0 refills | Status: DC

## 2016-10-17 MED ORDER — LEVOTHYROXINE SODIUM 50 MCG PO TABS: 50.0000 ug | ORAL_TABLET | Freq: Every day | ORAL | 1 refills | Status: DC

## 2016-10-17 NOTE — Progress Notes (Signed)
Subjective:    Patient ID: Gina Ryan, female    DOB: 27-Feb-1956, 60 y.o.   MRN: [id number]  HPI  Gina Ryan is a 60 year old female who is here today to follow up on her chronic medical conditions.  HTN: Monitors BP at home and reports systolic averages of [id number] and diastolic averages of Q000111Q.  She denies chest pain, palpitations, headaches, nosebleed, dizziness, numbness, tingling, weakness, and claudication.  Hypothyroidism:  She is taking her levothyroxine and denies changes in hair, skin, and nails, changes in bowels, or intolerance to heat and cold.  GERD:Prilosec as needed and she states that control of food triggers has provided relief. She has not used prilosec in over 6 months. She is interested in losing weight and reports that she is eating "junk food" such as potato chips and sweet snacks at night.   She is motivated to change her diet.  Vitamin D deficiency:  Vitamin D supplementation started 07/2016. She denies any adverse effects with supplement.  She denies any exercise as she is having left knee pain and has total knee replacement scheduled 10/28/2016.  She is followed by [facility] and reports that ice packs have provided excellent benefit for discomfort.  Wt Readings from Last 3 Encounters:  10/17/16 260 lb 3.2 oz (118 kg)  07/07/16 249 lb 11.2 oz (113.3 kg)  01/28/16 263 lb 6.4 oz (119.5 kg)    Eczema:  Area of dry, scaling skin on upper left thigh approximately quarter size in diameter that has been present for 2 months.  Associated minimal pruritis and dry skin present. She denies drainage or any other areas of scaling skin. Treatment at home includes shaving the area and applying moisturizers which has provided limited benefit.  Review of Systems  Constitutional: Negative for chills, fatigue and fever.  HENT: Negative for congestion, rhinorrhea, sinus pain, sinus pressure, sneezing and sore throat.   Eyes: Negative for visual disturbance.    Respiratory: Negative for cough, shortness of breath and wheezing.   Cardiovascular: Negative for chest pain and palpitations.  Gastrointestinal: Negative for abdominal pain, diarrhea, nausea and vomiting.  Genitourinary: Negative for dysuria.  Musculoskeletal: Negative for myalgias.  Skin: Positive for rash.  Neurological: Negative for dizziness, light-headedness, numbness and headaches.  Psychiatric/Behavioral:       Denies depressed or anxious mood   Past Medical History:  Diagnosis Date  . Atypical chest pain   . GERD (gastroesophageal reflux disease)   . Hypertension   . Morbid obesity ([place])   . Osteoarthritis, knee      Social History   Social History  . Marital status: Single    Spouse name: N/A  . Number of children: N/A  . Years of education: N/A   Occupational History  . [employer]   Social History Main Topics  . Smoking status: Never Smoker  . Smokeless tobacco: Not on file  . Alcohol use No  . Drug use: Unknown  . Sexual activity: Not on file   Other Topics Concern  . Not on file   Social History Narrative   Lives alone   Daugher , son and 4 grand children live in the are      Widowed - husband died 29 yrs ago    Past Surgical History:  Procedure Laterality Date  . CHOLECYSTECTOMY    . COSMETIC SURGERY  07/2010   (lower facelift, lower blepharoplasty)  . GASTRIC BYPASS    . TOTAL ABDOMINAL  HYSTERECTOMY      Family History  Problem Relation Age of Onset  . Stroke Mother   . Hypertension Mother   . Cancer Brother     diagnosed in late 31's    Allergies  Allergen Reactions  . Hydrocodone-Acetaminophen     REACTION: Shortness of Breath  . Diovan [Valsartan]     Eye redness. No current issues with losartan    Current Outpatient Prescriptions on File Prior to Visit  Medication Sig Dispense Refill  . acetaminophen-codeine (TYLENOL #3) 300-30 MG tablet take 1 tablet by mouth every 4 to 6 hours if needed  for pain  0  . estradiol (ESTRACE) 1 MG tablet take 1 tablet by mouth once daily 30 tablet 5  . losartan-hydrochlorothiazide (HYZAAR) 50-12.5 MG tablet take 1 tablet by mouth once daily 90 tablet 1  . meloxicam (MOBIC) 7.5 MG tablet Take 7.5 mg by mouth.    . potassium chloride (K-DUR,KLOR-CON) 10 MEQ tablet Take 1 tablet (10 mEq total) by mouth daily. 90 tablet 1  . Vitamin D, Ergocalciferol, (DRISDOL) 50000 units CAPS capsule Take 1 capsule (50,000 Units total) by mouth every 7 (seven) days. For 8 weeks then take twice monthly for maintenance 30 capsule 0  . omeprazole (PRILOSEC) 40 MG capsule take 1 capsule by mouth once daily (Patient not taking: Reported on 10/17/2016) 90 capsule 1  . traMADol (ULTRAM) 50 MG tablet take 1 tablet by mouth every 6 to 8 hours if needed for pain  0  . [DISCONTINUED] potassium chloride (K-DUR) 10 MEQ tablet Take 1 tablet (10 mEq total) by mouth daily. 90 tablet 0   No current facility-administered medications on file prior to visit.     BP 116/72 (BP Location: Left Arm, Patient Position: Sitting, Cuff Size: Large)   Pulse 64   Temp 97.5 F (36.4 C) (Oral)   Wt 260 lb 3.2 oz (118 kg)   SpO2 97%   BMI 42.00 kg/m        Objective:   Physical Exam  Constitutional: She is oriented to person, place, and time. She appears well-developed and well-nourished.  Eyes: Pupils are equal, round, and reactive to light. No scleral icterus.  Neck: Neck supple.  Cardiovascular: Regular rhythm and intact distal pulses.   Pulmonary/Chest: Effort normal and breath sounds normal. She has no wheezes. She has no rales.  Abdominal: Soft. Bowel sounds are normal. There is no tenderness.  Lymphadenopathy:    She has no cervical adenopathy.  Neurological: She is alert and oriented to person, place, and time. Coordination normal.  Skin: Skin is warm and dry.  Quarter sized area of dry, scaly, skin that has thickened skin. Suspect thickening from lichenification. No erythema,  or drainage present. No papules present as patient reported shaving the area with a razor.  Psychiatric: She has a normal mood and affect. Her behavior is normal. Judgment and thought content normal.          Assessment & Plan:  1. Essential hypertension Controlled; continue same dose of losartan and hydrochlorothiazide.  2. Hypothyroidism, unspecified type No symptoms noted today; will check TSH as she has resumed levothyroxine daily as recommended. - TSH  3. Gastroesophageal reflux disease, esophagitis presence not specified Controlled with avoidance of dietary triggers; prilosec as needed. Highly recommended weight loss.  4. Vitamin D deficiency Continue supplement of 50,000 U twice monthly for maintenance; will recheck level today. - Vitamin D, 25-hydroxy  5. Elevated parathyroid hormone Repeat PTH for evaluation due to  history of abnormal elevation of PTH level. - PTH, Intact and Calcium  6. Eczema, unspecified type Trial of kenalog for symptom relief; advised hydration of skin.  - triamcinolone (KENALOG) 0.025 % ointment; Apply 1 application topically 2 (two) times daily.  Dispense: 30 g; Refill: 0  Follow up in 6 months or sooner if needed. Lab results will determine earlier follow up if necessary.  Delano Metz, FNP-C

## 2016-10-17 NOTE — Progress Notes (Signed)
Pre visit review using our clinic review tool, if applicable. No additional management support is needed unless otherwise documented below in the visit note. 

## 2016-10-17 NOTE — Patient Instructions (Signed)
We have ordered labs or studies at this visit. It can take up to 1-2 weeks for results and processing. IF results require follow up or explanation, we will call you with instructions. Clinically stable results will be released to your [facility]. If you have not heard from [place] or cannot find your results in [facility] in 2 weeks please contact our office at [phone].  If you are not yet signed up for [facility], please consider signing up   Follow up in 6 months or sooner if needed.   Keep up motivation for weight loss.  It was a pleasure seeing you today.  Happy Holidays!   Atopic Dermatitis Atopic dermatitis is a skin disorder that causes inflammation of the skin. This is the most common type of eczema. Eczema is a group of skin conditions that cause the skin to be itchy, red, and swollen. This condition is generally worse during the cooler winter months and often improves during the warm summer months. Symptoms can vary from person to person. Atopic dermatitis usually starts showing signs in infancy and can last through adulthood. This condition cannot be passed from one person to another (non-contagious), but is more common in families. Atopic dermatitis may not always be present. When it is present, it is called a flare-up. What are the causes? The exact cause of this condition is not known. Flare-ups of the condition may be triggered by:  Contact with something you are sensitive or allergic to.  Stress.  Certain foods.  Extremely hot or cold weather.  Harsh chemicals and soaps.  Dry air.  Chlorine. What increases the risk? This condition is more likely to develop in people who have a personal history or family history of eczema, allergies, asthma, or hay fever. What are the signs or symptoms? Symptoms of this condition include:  Dry, scaly skin.  Red, itchy rash.  Itchiness, which can be severe. This may occur before the skin rash. This can make sleeping difficult.  Skin  thickening and cracking can occur over time. How is this diagnosed? This condition is diagnosed based on your symptoms, a medical history, and a physical exam. How is this treated? There is no cure for this condition, but symptoms can usually be controlled. Treatment focuses on:  Controlling the itching and scratching. You may be given medicines, such as antihistamines or steroid creams.  Limiting exposure to things that you are sensitive or allergic to (allergens).  Recognizing situations that cause stress and developing a plan to manage stress. If your atopic dermatitis does not get better with medicines or is all over your body (widespread) , a treatment using a specific type of light (phototherapy) may be used. Follow these instructions at home: Skin care  Keep your skin well-moisturized. This seals in moisture and help prevent dryness.  Use unscented lotions that have petroleum in them.  Avoid lotions that contain alcohol and water. They can dry the skin.  Keep baths or showers short (less than 5 minutes) in warm water. Do not use hot water.  Use mild, unscented cleansers for bathing. Avoid soap and bubble bath.  Apply a moisturizer to your skin right after a bath or shower.   Do not apply anything to your skin without checking with your health care provider. General instructions  Dress in clothes made of cotton or cotton blends. Dress lightly because heat increases itching.  When washing your clothes, rinse your clothes twice so all of the soap is removed.  Avoid any triggers that  can cause a flare-up.  Try to manage your stress.  Keep your fingernails cut short.  Avoid scratching. Scratching makes the rash and itching worse. It may also result in a skin infection (impetigo) due to a break in the skin caused by scratching.  Take or apply over-the-counter and prescription medicines only as told by your health care provider.  Keep all follow-up visits as told by your  health care provider. This is important.  Do not be around people who have cold sores or fever blisters. If you get the infection, it may cause your atopic dermatitis to worsen. Contact a health care provider if:  Your itching interferes with sleep.  Your rash gets worse or is not better within one week of starting treatment.  You have a fever.  You have a rash flare-up after having contact with someone who has cold sores or fever blisters. Get help right away if:  You develop pus or soft yellow scabs in the rash area. Summary  This condition causes a red rash and itchy, dry, scaly skin.  Treatment focuses on controlling the itching and scratching, limiting exposure to things that you are sensitive or allergic to (allergens), and recognizing situations that cause stress and developing a plan to manage stress.  Keep your skin well-moisturized.  Keep baths or showers less than 5 minutes. This information is not intended to replace advice given to you by your health care provider. Make sure you discuss any questions you have with your health care provider. Document Released: 10/14/2000 Document Revised: 03/24/2016 Document Reviewed: 05/20/2013 Elsevier Interactive Patient Education  2017 [employer].

## 2016-10-18 LAB — PTH, INTACT AND CALCIUM
Calcium: 9.1 mg/dL (ref 8.6–10.4)
PTH: 46 pg/mL (ref 14–64)

## 2016-10-20 NOTE — Patient Instructions (Addendum)
GIANNY CHOAT  10/20/2016   Your procedure is scheduled on: 10/28/16  Report to [facility]  Entrance take [facility]  elevators to 3rd floor to  [place] [facility] [place]  AM.  Call this number if you have problems the morning of surgery [phone]   Remember: ONLY 1 PERSON MAY GO WITH YOU TO SHORT STAY TO GET  READY MORNING OF [place].  Do not eat food or drink liquids :After Midnight.     Take these medicines the morning of surgery with A SIP OF WATER: Levothyroxine May take Tylenol with Codeine if needed                               You may not have any metal on your body including hair pins and              piercings  Do not wear jewelry, make-up, lotions, powders or perfumes, deodorant             Do not wear nail polish.  Do not shave  48 hours prior to surgery.              Men may shave face and neck.   Do not bring valuables to the hospital. [place].  Contacts, dentures or bridgework may not be worn into surgery.  Leave suitcase in the car. After surgery it may be brought to your room.    Special Instructions: N/A              Please read over the following fact sheets you were given: _____________________________________________________________________             [facility] - Preparing for Surgery Before surgery, you can play an important role.  Because skin is not sterile, your skin needs to be as free of germs as possible.  You can reduce the number of germs on your skin by washing with CHG (chlorahexidine gluconate) soap before surgery.  CHG is an antiseptic cleaner which kills germs and bonds with the skin to continue killing germs even after washing. Please DO NOT use if you have an allergy to CHG or antibacterial soaps.  If your skin becomes reddened/irritated stop using the CHG and inform your nurse when you arrive at Short Stay. Do not shave (including legs and underarms) for at  least 48 hours prior to the first CHG shower.  You may shave your face/neck. Please follow these instructions carefully:  1.  Shower with CHG Soap the night before surgery and the  morning of Surgery.  2.  If you choose to wash your hair, wash your hair first as usual with your  normal  shampoo.  3.  After you shampoo, rinse your hair and body thoroughly to remove the  shampoo.                           4.  Use CHG as you would any other liquid soap.  You can apply chg directly  to the skin and wash                       Gently with a scrungie or  clean washcloth.  5.  Apply the CHG Soap to your body ONLY FROM THE NECK DOWN.   Do not use on face/ open                           Wound or open sores. Avoid contact with eyes, ears mouth and genitals (private parts).                       Wash face,  Genitals (private parts) with your normal soap.             6.  Wash thoroughly, paying special attention to the area where your surgery  will be performed.  7.  Thoroughly rinse your body with warm water from the neck down.  8.  DO NOT shower/wash with your normal soap after using and rinsing off  the CHG Soap.                9.  Pat yourself dry with a clean towel.            10.  Wear clean pajamas.            11.  Place clean sheets on your bed the night of your first shower and do not  sleep with pets. Day of Surgery : Do not apply any lotions/deodorants the morning of surgery.  Please wear clean clothes to the hospital/surgery center.  FAILURE TO FOLLOW THESE INSTRUCTIONS MAY RESULT IN THE CANCELLATION OF YOUR SURGERY PATIENT SIGNATURE_________________________________  NURSE SIGNATURE__________________________________  ________________________________________________________________________   Adam Phenix  An incentive spirometer is a tool that can help keep your lungs clear and active. This tool measures how well you are filling your lungs with each breath. Taking long deep breaths  may help reverse or decrease the chance of developing breathing (pulmonary) problems (especially infection) following:  A long period of time when you are unable to move or be active. BEFORE THE PROCEDURE   If the spirometer includes an indicator to show your best effort, your nurse or respiratory therapist will set it to a desired goal.  If possible, sit up straight or lean slightly forward. Try not to slouch.  Hold the incentive spirometer in an upright position. INSTRUCTIONS FOR USE  1. Sit on the edge of your bed if possible, or sit up as far as you can in bed or on a chair. 2. Hold the incentive spirometer in an upright position. 3. Breathe out normally. 4. Place the mouthpiece in your mouth and seal your lips tightly around it. 5. Breathe in slowly and as deeply as possible, raising the piston or the ball toward the top of the column. 6. Hold your breath for 3-5 seconds or for as long as possible. Allow the piston or ball to fall to the bottom of the column. 7. Remove the mouthpiece from your mouth and breathe out normally. 8. Rest for a few seconds and repeat Steps 1 through 7 at least 10 times every 1-2 hours when you are awake. Take your time and take a few normal breaths between deep breaths. 9. The spirometer may include an indicator to show your best effort. Use the indicator as a goal to work toward during each repetition. 10. After each set of 10 deep breaths, practice coughing to be sure your lungs are clear. If you have an incision (the cut made at the time of surgery), support your incision when coughing  by placing a pillow or rolled up towels firmly against it. Once you are able to get out of bed, walk around indoors and cough well. You may stop using the incentive spirometer when instructed by your caregiver.  RISKS AND COMPLICATIONS  Take your time so you do not get dizzy or light-headed.  If you are in pain, you may need to take or ask for pain medication before doing  incentive spirometry. It is harder to take a deep breath if you are having pain. AFTER USE  Rest and breathe slowly and easily.  It can be helpful to keep track of a log of your progress. Your caregiver can provide you with a simple table to help with this. If you are using the spirometer at home, follow these instructions: Jay IF:   You are having difficultly using the spirometer.  You have trouble using the spirometer as often as instructed.  Your pain medication is not giving enough relief while using the spirometer.  You develop fever of 100.5 F (38.1 C) or higher. SEEK IMMEDIATE MEDICAL CARE IF:   You cough up bloody sputum that had not been present before.  You develop fever of 102 F (38.9 C) or greater.  You develop worsening pain at or near the incision site. MAKE SURE YOU:   Understand these instructions.  Will watch your condition.  Will get help right away if you are not doing well or get worse. Document Released: 02/27/2007 Document Revised: 01/09/2012 Document Reviewed: 04/30/2007 Exit[employer] Patient Information 2014 [employer], [place].   ________________________________________________________________________  WHAT IS A BLOOD TRANSFUSION? Blood Transfusion Information  A transfusion is the replacement of blood or some of its parts. Blood is made up of multiple cells which provide different functions.  Red blood cells carry oxygen and are used for blood loss replacement.  White blood cells fight against infection.  Platelets control bleeding.  Plasma helps clot blood.  Other blood products are available for specialized needs, such as hemophilia or other clotting disorders. BEFORE THE TRANSFUSION  Who gives blood for transfusions?   Healthy volunteers who are fully evaluated to make sure their blood is safe. This is blood bank blood. Transfusion therapy is the safest it has ever been in the practice of medicine. Before blood is taken from a  donor, a complete history is taken to make sure that person has no history of diseases nor engages in risky social behavior (examples are intravenous drug use or sexual activity with multiple partners). The donor's travel history is screened to minimize risk of transmitting infections, such as malaria. The donated blood is tested for signs of infectious diseases, such as HIV and hepatitis. The blood is then tested to be sure it is compatible with you in order to minimize the chance of a transfusion reaction. If you or a relative donates blood, this is often done in anticipation of surgery and is not appropriate for emergency situations. It takes many days to process the donated blood. RISKS AND COMPLICATIONS Although transfusion therapy is very safe and saves many lives, the main dangers of transfusion include:   Getting an infectious disease.  Developing a transfusion reaction. This is an allergic reaction to something in the blood you were given. Every precaution is taken to prevent this. The decision to have a blood transfusion has been considered carefully by your caregiver before blood is given. Blood is not given unless the benefits outweigh the risks. AFTER THE TRANSFUSION  Right after receiving a  blood transfusion, you will usually feel much better and more energetic. This is especially true if your red blood cells have gotten low (anemic). The transfusion raises the level of the red blood cells which carry oxygen, and this usually causes an energy increase.  The nurse administering the transfusion will monitor you carefully for complications. HOME CARE INSTRUCTIONS  No special instructions are needed after a transfusion. You may find your energy is better. Speak with your caregiver about any limitations on activity for underlying diseases you may have. SEEK MEDICAL CARE IF:   Your condition is not improving after your transfusion.  You develop redness or irritation at the intravenous (IV)  site. SEEK IMMEDIATE MEDICAL CARE IF:  Any of the following symptoms occur over the next 12 hours:  Shaking chills.  You have a temperature by mouth above 102 F (38.9 C), not controlled by medicine.  Chest, back, or muscle pain.  People around you feel you are not acting correctly or are confused.  Shortness of breath or difficulty breathing.  Dizziness and fainting.  You get a rash or develop hives.  You have a decrease in urine output.  Your urine turns a dark color or changes to pink, red, or brown. Any of the following symptoms occur over the next 10 days:  You have a temperature by mouth above 102 F (38.9 C), not controlled by medicine.  Shortness of breath.  Weakness after normal activity.  The white part of the eye turns yellow (jaundice).  You have a decrease in the amount of urine or are urinating less often.  Your urine turns a dark color or changes to pink, red, or brown. Document Released: 10/14/2000 Document Revised: 01/09/2012 Document Reviewed: 06/02/2008 [facility] Patient Information 2014 [place], [place].  _______________________________________________________________________

## 2016-10-21 ENCOUNTER — Encounter (HOSPITAL_COMMUNITY)
Admission: RE | Admit: 2016-10-21 | Discharge: 2016-10-21 | Disposition: A | Discharge status: Home / Self Care | Payer: BLUE CROSS/BLUE SHIELD | Source: Ambulatory Visit | Attending: Specialist | Admitting: Specialist

## 2016-10-21 ENCOUNTER — Encounter (HOSPITAL_COMMUNITY): Payer: Self-pay

## 2016-10-21 DIAGNOSIS — Z01812 Encounter for preprocedural laboratory examination: Secondary | ICD-10-CM | POA: Insufficient documentation

## 2016-10-21 LAB — URINALYSIS, ROUTINE W REFLEX MICROSCOPIC
BILIRUBIN URINE: NEGATIVE
Glucose, UA: NEGATIVE mg/dL
Hgb urine dipstick: NEGATIVE
KETONES UR: NEGATIVE mg/dL
LEUKOCYTES UA: NEGATIVE
NITRITE: NEGATIVE
Protein, ur: NEGATIVE mg/dL
SPECIFIC GRAVITY, URINE: 1.004 — AB (ref 1.005–1.030)
pH: 7 (ref 5.0–8.0)

## 2016-10-21 LAB — BASIC METABOLIC PANEL
Anion gap: 6 (ref 5–15)
BUN: 10 mg/dL (ref 6–20)
CALCIUM: 9.2 mg/dL (ref 8.9–10.3)
CO2: 32 mmol/L (ref 22–32)
CREATININE: 0.78 mg/dL (ref 0.44–1.00)
Chloride: 101 mmol/L (ref 101–111)
Glucose, Bld: 108 mg/dL — ABNORMAL HIGH (ref 65–99)
Potassium: 4.7 mmol/L (ref 3.5–5.1)
SODIUM: 139 mmol/L (ref 135–145)

## 2016-10-21 LAB — TYPE AND SCREEN
ABO/RH(D): O POS
Antibody Screen: POSITIVE
DAT, IgG: NEGATIVE
PT AG Type: NEGATIVE

## 2016-10-21 LAB — CBC
HCT: 39.4 % (ref 36.0–46.0)
Hemoglobin: 13.3 g/dL (ref 12.0–15.0)
MCH: 27.7 pg (ref 26.0–34.0)
MCHC: 33.8 g/dL (ref 30.0–36.0)
MCV: 81.9 fL (ref 78.0–100.0)
PLATELETS: 276 10*3/uL (ref 150–400)
RBC: 4.81 MIL/uL (ref 3.87–5.11)
RDW: 13.5 % (ref 11.5–15.5)
WBC: 7.8 10*3/uL (ref 4.0–10.5)

## 2016-10-21 LAB — ABO/RH: ABO/RH(D): O POS

## 2016-10-21 LAB — PROTIME-INR
INR: 0.96
PROTHROMBIN TIME: 12.8 s (ref 11.4–15.2)

## 2016-10-21 LAB — APTT: APTT: 35 s (ref 24–36)

## 2016-10-21 LAB — SURGICAL PCR SCREEN
MRSA, PCR: NEGATIVE
STAPHYLOCOCCUS AUREUS: POSITIVE — AB

## 2016-10-26 NOTE — H&P (Signed)
TOTAL KNEE ADMISSION H&P  Patient is being admitted for left total knee arthroplasty.  Subjective:  Chief Complaint:left knee pain.  HPI: Gina Ryan, 60 y.o. female, has a history of pain and functional disability in the left knee due to arthritis and has failed non-surgical conservative treatments for greater than 12 weeks to includeNSAID's and/or analgesics, corticosteriod injections, viscosupplementation injections, use of assistive devices, weight reduction as appropriate and activity modification.  Onset of symptoms was gradual, starting 3 years ago with gradually worsening course since that time. The patient noted no past surgery on the left knee(s).  Patient currently rates pain in the left knee(s) at 8 out of 10 with activity. Patient has worsening of pain with activity and weight bearing, pain that interferes with activities of daily living and pain with passive range of motion.  Patient has evidence of subchondral sclerosis, periarticular osteophytes and joint space narrowing by imaging studies. There is no active infection.  Patient Active Problem List   Diagnosis Date Noted  . Weight gain 03/03/2014  . Elevated parathyroid hormone 03/03/2014  . Hypothyroidism 03/03/2014  . Obesity 01/19/2011  . NECK PAIN, LEFT 04/07/2010  . BARIATRIC SURGERY STATUS 12/13/2007  . Essential hypertension 09/05/2007  . GERD 09/05/2007  . Abnormal glucose 09/05/2007  . OSTEOARTHRITIS, KNEE 08/07/2007  . TOTAL HYSTERECTOMY AND BILATERAL SALPINGOOPHERECTOMY, HX OF 08/07/2007  . CHOLECYSTECTOMY, HX OF 08/07/2007   Past Medical History:  Diagnosis Date  . Atypical chest pain   . GERD (gastroesophageal reflux disease)   . Hypertension   . Morbid obesity ([place])   . Osteoarthritis, knee     Past Surgical History:  Procedure Laterality Date  . CHOLECYSTECTOMY    . COSMETIC SURGERY  07/2010   (lower facelift, lower blepharoplasty)  . GASTRIC BYPASS    . LAPAROSCOPIC GASTRIC BANDING    .  ROTATOR CUFF REPAIR Bilateral   . TOTAL ABDOMINAL HYSTERECTOMY      No prescriptions prior to admission.   Allergies  Allergen Reactions  . Hydrocodone-Acetaminophen     REACTION: Shortness of Breath  . Diovan [Valsartan]     Eye redness. No current issues with losartan    Social History  Substance Use Topics  . Smoking status: Never Smoker  . Smokeless tobacco: Never Used  . Alcohol use No    Family History  Problem Relation Age of Onset  . Stroke Mother   . Hypertension Mother   . Cancer Brother     diagnosed in late 86's     Review of Systems  Constitutional: Negative.   HENT: Negative.   Eyes: Negative.   Respiratory: Negative.   Cardiovascular: Negative.   Gastrointestinal: Negative.   Genitourinary: Negative.   Musculoskeletal: Positive for joint pain.  Skin: Negative.   Neurological: Negative.   Endo/Heme/Allergies: Negative.   Psychiatric/Behavioral: Negative.     Objective:  Physical Exam  Constitutional: She is oriented to person, place, and time. She appears well-developed.  HENT:  Head: Normocephalic.  Eyes: EOM are normal.  Neck: Normal range of motion.  Cardiovascular: Normal rate and intact distal pulses.   Respiratory: Effort normal.  GI: Soft.  Genitourinary:  Genitourinary Comments: Deferred  Musculoskeletal:  Pain in bilateral knees with weightbearing and ROM. Left knee stable. LLE grossly n/v intact  Neurological: She is alert and oriented to person, place, and time. She has normal reflexes.  Skin: Skin is warm and dry.  Psychiatric: Her behavior is normal.    Vital signs in last 24 hours:  Labs:   Estimated body mass index is 41.16 kg/m as calculated from the following:   Height as of 10/21/16: 5\' 6"  (1.676 m).   Weight as of 10/21/16: 115.7 kg (255 lb).   Imaging Review Plain radiographs demonstrate severe degenerative joint disease of the left knee(s). The overall alignment ismild varus. The bone quality appears to be  good for age and reported activity level.  Assessment/Plan:  End stage arthritis, left knee   The patient history, physical examination, clinical judgment of the provider and imaging studies are consistent with end stage degenerative joint disease of the left knee(s) and total knee arthroplasty is deemed medically necessary. The treatment options including medical management, injection therapy arthroscopy and arthroplasty were discussed at length. The risks and benefits of total knee arthroplasty were presented and reviewed. The risks due to aseptic loosening, infection, stiffness, patella tracking problems, thromboembolic complications and other imponderables were discussed. The patient acknowledged the explanation, agreed to proceed with the plan and consent was signed. Patient is being admitted for inpatient treatment for surgery, pain control, PT, OT, prophylactic antibiotics, VTE prophylaxis, progressive ambulation and ADL's and discharge planning. The patient is planning to be discharged home with home health services.  Will use IV tranexamic acid. Contraindications and adverse affects of Tranexamic acid discussed in detail. Patient denies any of these at this time and understands the risks and benefits.

## 2016-10-27 NOTE — Anesthesia Preprocedure Evaluation (Signed)
Anesthesia Evaluation  Patient identified by MRN, date of birth, ID band Patient awake    Reviewed: Allergy & Precautions, NPO status , Patient's Chart, lab work & pertinent test results  Airway Mallampati: II  TM Distance: >3 FB Neck ROM: Full    Dental no notable dental hx.    Pulmonary neg pulmonary ROS,    Pulmonary exam normal breath sounds clear to auscultation       Cardiovascular hypertension, Pt. on medications Normal cardiovascular exam Rhythm:Regular Rate:Normal     Neuro/Psych negative neurological ROS  negative psych ROS   GI/Hepatic Neg liver ROS, GERD  ,  Endo/Other  Hypothyroidism Morbid obesity  Renal/GU negative Renal ROS     Musculoskeletal  (+) Arthritis ,   Abdominal (+) + obese,   Peds  Hematology negative hematology ROS (+)   Anesthesia Other Findings   Reproductive/Obstetrics negative OB ROS                             Anesthesia Physical Anesthesia Plan  ASA: III  Anesthesia Plan: Regional and Spinal   Post-op Pain Management:  Regional for Post-op pain   Induction:   Airway Management Planned:   Additional Equipment:   Intra-op Plan:   Post-operative Plan:   Informed Consent: I have reviewed the patients History and Physical, chart, labs and discussed the procedure including the risks, benefits and alternatives for the proposed anesthesia with the patient or authorized representative who has indicated his/her understanding and acceptance.   Dental advisory given  Plan Discussed with: CRNA  Anesthesia Plan Comments:         Anesthesia Quick Evaluation

## 2016-10-28 ENCOUNTER — Inpatient Hospital Stay (HOSPITAL_COMMUNITY)
Admission: RE | Admit: 2016-10-28 | Discharge: 2016-10-30 | DRG: 470 | Disposition: A | Discharge status: Home Health | Payer: BLUE CROSS/BLUE SHIELD | Source: Ambulatory Visit | Attending: Specialist | Admitting: Specialist

## 2016-10-28 ENCOUNTER — Inpatient Hospital Stay (HOSPITAL_COMMUNITY): Payer: BLUE CROSS/BLUE SHIELD | Admitting: Anesthesiology

## 2016-10-28 ENCOUNTER — Encounter (HOSPITAL_COMMUNITY): Payer: Self-pay | Admitting: *Deleted

## 2016-10-28 ENCOUNTER — Encounter (HOSPITAL_COMMUNITY)
Admission: RE | Disposition: A | Discharge status: Home Health | Payer: Self-pay | Source: Ambulatory Visit | Attending: Specialist

## 2016-10-28 DIAGNOSIS — Z90710 Acquired absence of both cervix and uterus: Secondary | ICD-10-CM

## 2016-10-28 DIAGNOSIS — M1712 Unilateral primary osteoarthritis, left knee: Principal | ICD-10-CM | POA: Diagnosis present

## 2016-10-28 DIAGNOSIS — M25762 Osteophyte, left knee: Secondary | ICD-10-CM | POA: Diagnosis present

## 2016-10-28 DIAGNOSIS — M179 Osteoarthritis of knee, unspecified: Secondary | ICD-10-CM

## 2016-10-28 DIAGNOSIS — Z8249 Family history of ischemic heart disease and other diseases of the circulatory system: Secondary | ICD-10-CM | POA: Diagnosis not present

## 2016-10-28 DIAGNOSIS — E039 Hypothyroidism, unspecified: Secondary | ICD-10-CM | POA: Diagnosis present

## 2016-10-28 DIAGNOSIS — Z885 Allergy status to narcotic agent status: Secondary | ICD-10-CM

## 2016-10-28 DIAGNOSIS — Z9884 Bariatric surgery status: Secondary | ICD-10-CM

## 2016-10-28 DIAGNOSIS — K219 Gastro-esophageal reflux disease without esophagitis: Secondary | ICD-10-CM | POA: Diagnosis present

## 2016-10-28 DIAGNOSIS — I10 Essential (primary) hypertension: Secondary | ICD-10-CM | POA: Diagnosis present

## 2016-10-28 DIAGNOSIS — Z6841 Body Mass Index (BMI) 40.0 and over, adult: Secondary | ICD-10-CM | POA: Diagnosis not present

## 2016-10-28 DIAGNOSIS — M25562 Pain in left knee: Secondary | ICD-10-CM | POA: Diagnosis present

## 2016-10-28 DIAGNOSIS — Z96659 Presence of unspecified artificial knee joint: Secondary | ICD-10-CM

## 2016-10-28 DIAGNOSIS — Z888 Allergy status to other drugs, medicaments and biological substances status: Secondary | ICD-10-CM

## 2016-10-28 DIAGNOSIS — M1710 Unilateral primary osteoarthritis, unspecified knee: Secondary | ICD-10-CM

## 2016-10-28 HISTORY — PX: TOTAL KNEE ARTHROPLASTY: SHX125

## 2016-10-28 SURGERY — ARTHROPLASTY, KNEE, TOTAL
Anesthesia: Regional | Site: Knee | Laterality: Left

## 2016-10-28 MED ORDER — MIDAZOLAM HCL 5 MG/5ML IJ SOLN
INTRAMUSCULAR | Status: DC | PRN
  Administered 2016-10-28: 2 mg via INTRAVENOUS

## 2016-10-28 MED ORDER — TRANEXAMIC ACID 1000 MG/10ML IV SOLN
1000.0000 mg | INTRAVENOUS | Status: AC
  Administered 2016-10-28: 1000 mg via INTRAVENOUS
  Filled 2016-10-28: qty 1100

## 2016-10-28 MED ORDER — DIPHENHYDRAMINE HCL 12.5 MG/5ML PO ELIX: 12.5000 mg | ORAL_SOLUTION | ORAL | Status: DC | PRN

## 2016-10-28 MED ORDER — DOCUSATE SODIUM 100 MG PO CAPS
100.0000 mg | ORAL_CAPSULE | Freq: Two times a day (BID) | ORAL | Status: DC
  Administered 2016-10-28 – 2016-10-30 (×4): 100 mg via ORAL
  Filled 2016-10-28 (×4): qty 1

## 2016-10-28 MED ORDER — ROPIVACAINE HCL 7.5 MG/ML IJ SOLN
INTRAMUSCULAR | Status: DC | PRN
  Administered 2016-10-28: 20 mL via PERINEURAL

## 2016-10-28 MED ORDER — PHENOL 1.4 % MT LIQD: 1.0000 | OROMUCOSAL | Status: DC | PRN

## 2016-10-28 MED ORDER — ROPIVACAINE HCL 7.5 MG/ML IJ SOLN
INTRAMUSCULAR | Status: AC
  Filled 2016-10-28: qty 20

## 2016-10-28 MED ORDER — METHOCARBAMOL 500 MG PO TABS
500.0000 mg | ORAL_TABLET | Freq: Four times a day (QID) | ORAL | Status: DC | PRN
  Administered 2016-10-28 – 2016-10-29 (×2): 500 mg via ORAL
  Filled 2016-10-28 (×4): qty 1

## 2016-10-28 MED ORDER — FENTANYL CITRATE (PF) 100 MCG/2ML IJ SOLN
INTRAMUSCULAR | Status: AC
  Filled 2016-10-28: qty 2

## 2016-10-28 MED ORDER — LEVOTHYROXINE SODIUM 50 MCG PO TABS
50.0000 ug | ORAL_TABLET | Freq: Every day | ORAL | Status: DC
  Administered 2016-10-29 – 2016-10-30 (×2): 50 ug via ORAL
  Filled 2016-10-28 (×2): qty 1

## 2016-10-28 MED ORDER — CEFAZOLIN SODIUM-DEXTROSE 2-4 GM/100ML-% IV SOLN
2.0000 g | Freq: Four times a day (QID) | INTRAVENOUS | Status: AC
  Administered 2016-10-28 (×2): 2 g via INTRAVENOUS
  Filled 2016-10-28 (×2): qty 100

## 2016-10-28 MED ORDER — ASPIRIN EC 325 MG PO TBEC: 325.0000 mg | DELAYED_RELEASE_TABLET | Freq: Two times a day (BID) | ORAL | 0 refills | Status: DC

## 2016-10-28 MED ORDER — HYDROMORPHONE HCL 1 MG/ML IJ SOLN: 0.2500 mg | INTRAMUSCULAR | Status: DC | PRN

## 2016-10-28 MED ORDER — SODIUM CHLORIDE 0.9 % IJ SOLN
INTRAMUSCULAR | Status: DC | PRN
  Administered 2016-10-28: 30 mL

## 2016-10-28 MED ORDER — LACTATED RINGERS IV SOLN
INTRAVENOUS | Status: DC
  Administered 2016-10-28: 12:00:00 via INTRAVENOUS

## 2016-10-28 MED ORDER — LOSARTAN POTASSIUM 50 MG PO TABS
50.0000 mg | ORAL_TABLET | Freq: Every day | ORAL | Status: DC
  Administered 2016-10-28 – 2016-10-29 (×2): 50 mg via ORAL
  Filled 2016-10-28 (×3): qty 1

## 2016-10-28 MED ORDER — TEMAZEPAM 15 MG PO CAPS: 15.0000 mg | ORAL_CAPSULE | Freq: Every evening | ORAL | Status: DC | PRN

## 2016-10-28 MED ORDER — METHOCARBAMOL 500 MG PO TABS: 500.0000 mg | ORAL_TABLET | Freq: Three times a day (TID) | ORAL | 2 refills | Status: DC | PRN

## 2016-10-28 MED ORDER — MEPERIDINE HCL 50 MG/ML IJ SOLN
6.2500 mg | INTRAMUSCULAR | Status: DC | PRN
  Administered 2016-10-28: 12.5 mg via INTRAVENOUS

## 2016-10-28 MED ORDER — ACETAMINOPHEN 325 MG PO TABS
650.0000 mg | ORAL_TABLET | Freq: Four times a day (QID) | ORAL | Status: DC | PRN
  Administered 2016-10-28 – 2016-10-29 (×2): 650 mg via ORAL
  Filled 2016-10-28 (×2): qty 2

## 2016-10-28 MED ORDER — BUPIVACAINE HCL (PF) 0.25 % IJ SOLN
INTRAMUSCULAR | Status: AC
  Filled 2016-10-28: qty 30

## 2016-10-28 MED ORDER — ONDANSETRON HCL 4 MG PO TABS: 4.0000 mg | ORAL_TABLET | Freq: Four times a day (QID) | ORAL | Status: DC | PRN

## 2016-10-28 MED ORDER — TRIAMCINOLONE ACETONIDE 0.025 % EX CREA
TOPICAL_CREAM | Freq: Two times a day (BID) | CUTANEOUS | Status: DC
  Administered 2016-10-28: 23:00:00 via TOPICAL
  Filled 2016-10-28: qty 15

## 2016-10-28 MED ORDER — FERROUS SULFATE 325 (65 FE) MG PO TABS
325.0000 mg | ORAL_TABLET | Freq: Three times a day (TID) | ORAL | Status: DC
  Administered 2016-10-28 – 2016-10-30 (×3): 325 mg via ORAL
  Filled 2016-10-28 (×4): qty 1

## 2016-10-28 MED ORDER — HYDROCHLOROTHIAZIDE 12.5 MG PO CAPS
12.5000 mg | ORAL_CAPSULE | Freq: Every day | ORAL | Status: DC
  Administered 2016-10-29: 12.5 mg via ORAL
  Filled 2016-10-28 (×3): qty 1

## 2016-10-28 MED ORDER — POVIDONE-IODINE 7.5 % EX SOLN: Freq: Once | CUTANEOUS | Status: DC

## 2016-10-28 MED ORDER — METHOCARBAMOL 1000 MG/10ML IJ SOLN
500.0000 mg | Freq: Four times a day (QID) | INTRAVENOUS | Status: DC | PRN
  Administered 2016-10-28: 500 mg via INTRAVENOUS
  Filled 2016-10-28: qty 5
  Filled 2016-10-28: qty 550

## 2016-10-28 MED ORDER — ACETAMINOPHEN 10 MG/ML IV SOLN
INTRAVENOUS | Status: AC
  Filled 2016-10-28: qty 100

## 2016-10-28 MED ORDER — MIDAZOLAM HCL 2 MG/2ML IJ SOLN
INTRAMUSCULAR | Status: AC
  Filled 2016-10-28: qty 2

## 2016-10-28 MED ORDER — SODIUM CHLORIDE 0.9 % IV SOLN
INTRAVENOUS | Status: DC
  Administered 2016-10-28: 75 mL/h via INTRAVENOUS

## 2016-10-28 MED ORDER — SODIUM CHLORIDE 0.9 % IJ SOLN
INTRAMUSCULAR | Status: AC
  Filled 2016-10-28: qty 50

## 2016-10-28 MED ORDER — BISACODYL 5 MG PO TBEC: 5.0000 mg | DELAYED_RELEASE_TABLET | Freq: Every day | ORAL | Status: DC | PRN

## 2016-10-28 MED ORDER — PROMETHAZINE HCL 25 MG/ML IJ SOLN: 6.2500 mg | INTRAMUSCULAR | Status: DC | PRN

## 2016-10-28 MED ORDER — MAGNESIUM CITRATE PO SOLN: 1.0000 | Freq: Once | ORAL | Status: DC | PRN

## 2016-10-28 MED ORDER — ONDANSETRON HCL 4 MG/2ML IJ SOLN
INTRAMUSCULAR | Status: DC | PRN
  Administered 2016-10-28: 4 mg via INTRAVENOUS

## 2016-10-28 MED ORDER — ONDANSETRON HCL 4 MG/2ML IJ SOLN: 4.0000 mg | Freq: Four times a day (QID) | INTRAMUSCULAR | Status: DC | PRN

## 2016-10-28 MED ORDER — METOCLOPRAMIDE HCL 5 MG/ML IJ SOLN: 5.0000 mg | Freq: Three times a day (TID) | INTRAMUSCULAR | Status: DC | PRN

## 2016-10-28 MED ORDER — PROPOFOL 10 MG/ML IV BOLUS
INTRAVENOUS | Status: AC
  Filled 2016-10-28: qty 40

## 2016-10-28 MED ORDER — DEXAMETHASONE SODIUM PHOSPHATE 10 MG/ML IJ SOLN
INTRAMUSCULAR | Status: AC
  Filled 2016-10-28: qty 1

## 2016-10-28 MED ORDER — PROPOFOL 10 MG/ML IV BOLUS
INTRAVENOUS | Status: AC
  Filled 2016-10-28: qty 60

## 2016-10-28 MED ORDER — PROPOFOL 10 MG/ML IV BOLUS
INTRAVENOUS | Status: AC
  Filled 2016-10-28: qty 20

## 2016-10-28 MED ORDER — ONDANSETRON HCL 4 MG/2ML IJ SOLN
INTRAMUSCULAR | Status: AC
  Filled 2016-10-28: qty 2

## 2016-10-28 MED ORDER — DEXAMETHASONE SODIUM PHOSPHATE 10 MG/ML IJ SOLN
10.0000 mg | Freq: Once | INTRAMUSCULAR | Status: AC
  Administered 2016-10-29: 10 mg via INTRAVENOUS
  Filled 2016-10-28: qty 1

## 2016-10-28 MED ORDER — MENTHOL 3 MG MT LOZG: 1.0000 | LOZENGE | OROMUCOSAL | Status: DC | PRN

## 2016-10-28 MED ORDER — KETOROLAC TROMETHAMINE 30 MG/ML IJ SOLN
INTRAMUSCULAR | Status: DC | PRN
  Administered 2016-10-28: 30 mg

## 2016-10-28 MED ORDER — LACTATED RINGERS IV SOLN
INTRAVENOUS | Status: DC | PRN
  Administered 2016-10-28 (×3): via INTRAVENOUS

## 2016-10-28 MED ORDER — ACETAMINOPHEN 10 MG/ML IV SOLN
INTRAVENOUS | Status: DC | PRN
  Administered 2016-10-28: 1000 mg via INTRAVENOUS

## 2016-10-28 MED ORDER — PROPOFOL 10 MG/ML IV BOLUS
INTRAVENOUS | Status: DC | PRN
  Administered 2016-10-28: 30 mg via INTRAVENOUS

## 2016-10-28 MED ORDER — BUPIVACAINE IN DEXTROSE 0.75-8.25 % IT SOLN
INTRATHECAL | Status: DC | PRN
  Administered 2016-10-28: 2 mL via INTRATHECAL

## 2016-10-28 MED ORDER — METOCLOPRAMIDE HCL 5 MG PO TABS: 5.0000 mg | ORAL_TABLET | Freq: Three times a day (TID) | ORAL | Status: DC | PRN

## 2016-10-28 MED ORDER — POLYETHYLENE GLYCOL 3350 17 G PO PACK: 17.0000 g | PACK | Freq: Every day | ORAL | Status: DC | PRN

## 2016-10-28 MED ORDER — OXYCODONE HCL 5 MG/5ML PO SOLN: 5.0000 mg | ORAL | 0 refills | Status: DC | PRN

## 2016-10-28 MED ORDER — LOSARTAN POTASSIUM-HCTZ 50-12.5 MG PO TABS: 1.0000 | ORAL_TABLET | Freq: Every day | ORAL | Status: DC

## 2016-10-28 MED ORDER — PROPOFOL 500 MG/50ML IV EMUL
INTRAVENOUS | Status: DC | PRN
  Administered 2016-10-28: 150 ug/kg/min via INTRAVENOUS

## 2016-10-28 MED ORDER — CEFAZOLIN SODIUM-DEXTROSE 2-4 GM/100ML-% IV SOLN
INTRAVENOUS | Status: AC
Start: 2016-10-28 — End: 2016-10-28
  Filled 2016-10-28: qty 100

## 2016-10-28 MED ORDER — ACETAMINOPHEN 650 MG RE SUPP: 650.0000 mg | Freq: Four times a day (QID) | RECTAL | Status: DC | PRN

## 2016-10-28 MED ORDER — ENOXAPARIN SODIUM 30 MG/0.3ML ~~LOC~~ SOLN
30.0000 mg | Freq: Two times a day (BID) | SUBCUTANEOUS | Status: DC
  Administered 2016-10-29 – 2016-10-30 (×3): 30 mg via SUBCUTANEOUS
  Filled 2016-10-28 (×3): qty 0.3

## 2016-10-28 MED ORDER — KETOROLAC TROMETHAMINE 30 MG/ML IJ SOLN
INTRAMUSCULAR | Status: AC
  Filled 2016-10-28: qty 1

## 2016-10-28 MED ORDER — MEPERIDINE HCL 50 MG/ML IJ SOLN
INTRAMUSCULAR | Status: AC
  Filled 2016-10-28: qty 1

## 2016-10-28 MED ORDER — SODIUM CHLORIDE 0.9 % IR SOLN
Status: DC | PRN
  Administered 2016-10-28: 1000 mL

## 2016-10-28 MED ORDER — TRIAMCINOLONE ACETONIDE 0.025 % EX OINT: 1.0000 "application " | TOPICAL_OINTMENT | Freq: Two times a day (BID) | CUTANEOUS | Status: DC

## 2016-10-28 MED ORDER — CEFAZOLIN SODIUM-DEXTROSE 2-4 GM/100ML-% IV SOLN
2.0000 g | INTRAVENOUS | Status: AC
  Administered 2016-10-28: 2 g via INTRAVENOUS

## 2016-10-28 MED ORDER — HYDROMORPHONE HCL 1 MG/ML IJ SOLN
1.0000 mg | INTRAMUSCULAR | Status: DC | PRN
  Administered 2016-10-28 (×2): 1 mg via INTRAVENOUS
  Filled 2016-10-28 (×3): qty 1

## 2016-10-28 MED ORDER — BUPIVACAINE HCL (PF) 0.25 % IJ SOLN
INTRAMUSCULAR | Status: DC | PRN
  Administered 2016-10-28: 30 mL

## 2016-10-28 MED ORDER — DEXAMETHASONE SODIUM PHOSPHATE 10 MG/ML IJ SOLN
10.0000 mg | Freq: Once | INTRAMUSCULAR | Status: AC
  Administered 2016-10-28: 10 mg via INTRAVENOUS

## 2016-10-28 MED ORDER — OXYCODONE HCL 5 MG/5ML PO SOLN
5.0000 mg | ORAL | Status: DC | PRN
  Administered 2016-10-28 – 2016-10-29 (×5): 5 mg via ORAL
  Filled 2016-10-28 (×9): qty 5

## 2016-10-28 MED ORDER — POTASSIUM CHLORIDE CRYS ER 10 MEQ PO TBCR
10.0000 meq | EXTENDED_RELEASE_TABLET | Freq: Every day | ORAL | Status: DC
  Administered 2016-10-29: 10 meq via ORAL
  Filled 2016-10-28 (×2): qty 1

## 2016-10-28 MED ORDER — PHENYLEPHRINE HCL 10 MG/ML IJ SOLN
INTRAVENOUS | Status: DC | PRN
  Administered 2016-10-28: 30 ug/min via INTRAVENOUS

## 2016-10-28 MED ORDER — FENTANYL CITRATE (PF) 100 MCG/2ML IJ SOLN
INTRAMUSCULAR | Status: DC | PRN
  Administered 2016-10-28 (×2): 50 ug via INTRAVENOUS

## 2016-10-28 MED ORDER — ALUM & MAG HYDROXIDE-SIMETH 200-200-20 MG/5ML PO SUSP: 30.0000 mL | ORAL | Status: DC | PRN

## 2016-10-28 SURGICAL SUPPLY — 62 items
ADH SKN CLS APL DERMABOND .7 (GAUZE/BANDAGES/DRESSINGS) ×1
BAG DECANTER FOR FLEXI CONT (MISCELLANEOUS) IMPLANT
BAG SPEC THK2 15X12 ZIP CLS (MISCELLANEOUS) ×2
BAG ZIPLOCK 12X15 (MISCELLANEOUS) ×4 IMPLANT
BANDAGE ACE 4X5 VEL STRL LF (GAUZE/BANDAGES/DRESSINGS) ×2 IMPLANT
BANDAGE ACE 6X5 VEL STRL LF (GAUZE/BANDAGES/DRESSINGS) ×2 IMPLANT
BANDAGE ELASTIC 4 VELCRO ST LF (GAUZE/BANDAGES/DRESSINGS) ×1 IMPLANT
BANDAGE ELASTIC 6 VELCRO ST LF (GAUZE/BANDAGES/DRESSINGS) ×1 IMPLANT
BLADE SAG 18X100X1.27 (BLADE) ×2 IMPLANT
BLADE SAW SGTL 13.0X1.19X90.0M (BLADE) ×2 IMPLANT
CAP KNEE TOTAL 3 SIGMA ×1 IMPLANT
CEMENT HV SMART SET (Cement) ×2 IMPLANT
CLOTH BEACON ORANGE TIMEOUT ST (SAFETY) ×2 IMPLANT
CUFF TOURN SGL QUICK 34 (TOURNIQUET CUFF) ×2
CUFF TRNQT CYL 34X4X40X1 (TOURNIQUET CUFF) ×1 IMPLANT
DECANTER SPIKE VIAL GLASS SM (MISCELLANEOUS) ×2 IMPLANT
DERMABOND ADVANCED (GAUZE/BANDAGES/DRESSINGS) ×1
DERMABOND ADVANCED .7 DNX12 (GAUZE/BANDAGES/DRESSINGS) ×1 IMPLANT
DRAPE U-SHAPE 47X51 STRL (DRAPES) ×2 IMPLANT
DRSG AQUACEL AG ADV 3.5X10 (GAUZE/BANDAGES/DRESSINGS) ×2 IMPLANT
DRSG TEGADERM 4X4.75 (GAUZE/BANDAGES/DRESSINGS) ×2 IMPLANT
DURAPREP 26ML APPLICATOR (WOUND CARE) ×4 IMPLANT
ELECT REM PT RETURN 9FT ADLT (ELECTROSURGICAL) ×2
ELECTRODE REM PT RTRN 9FT ADLT (ELECTROSURGICAL) ×1 IMPLANT
EVACUATOR 1/8 PVC DRAIN (DRAIN) ×2 IMPLANT
GAUZE SPONGE 2X2 8PLY STRL LF (GAUZE/BANDAGES/DRESSINGS) ×1 IMPLANT
GLOVE BIOGEL PI IND STRL 8 (GLOVE) ×2 IMPLANT
GLOVE BIOGEL PI INDICATOR 8 (GLOVE) ×2
GLOVE ECLIPSE 8.0 STRL XLNG CF (GLOVE) ×4 IMPLANT
GLOVE SURG ORTHO 9.0 STRL STRW (GLOVE) ×2 IMPLANT
GLOVE SURG SS PI 7.5 STRL IVOR (GLOVE) ×2 IMPLANT
GOWN STRL REUS W/TWL XL LVL3 (GOWN DISPOSABLE) ×4 IMPLANT
HANDPIECE INTERPULSE COAX TIP (DISPOSABLE) ×2
IMMOBILIZER KNEE 20 (SOFTGOODS) ×3 IMPLANT
IMMOBILIZER KNEE 20 THIGH 36 (SOFTGOODS) ×1 IMPLANT
LIQUID BAND (GAUZE/BANDAGES/DRESSINGS) ×1 IMPLANT
NS IRRIG 1000ML POUR BTL (IV SOLUTION) ×2 IMPLANT
PACK TOTAL KNEE CUSTOM (KITS) ×2 IMPLANT
POSITIONER SURGICAL ARM (MISCELLANEOUS) ×2 IMPLANT
SET HNDPC FAN SPRY TIP SCT (DISPOSABLE) ×1 IMPLANT
SET PAD KNEE POSITIONER (MISCELLANEOUS) ×2 IMPLANT
SPONGE GAUZE 2X2 STER 10/PKG (GAUZE/BANDAGES/DRESSINGS) ×1
SPONGE LAP 18X18 X RAY DECT (DISPOSABLE) IMPLANT
SPONGE SURGIFOAM ABS GEL 100 (HEMOSTASIS) ×2 IMPLANT
STOCKINETTE 6  STRL (DRAPES) ×1
STOCKINETTE 6 STRL (DRAPES) ×1 IMPLANT
SUCTION FRAZIER HANDLE 12FR (TUBING) ×1
SUCTION TUBE FRAZIER 12FR DISP (TUBING) ×1 IMPLANT
SUT BONE WAX W31G (SUTURE) IMPLANT
SUT MNCRL AB 3-0 PS2 18 (SUTURE) ×2 IMPLANT
SUT VIC AB 1 CT1 27 (SUTURE) ×8
SUT VIC AB 1 CT1 27XBRD ANTBC (SUTURE) ×4 IMPLANT
SUT VIC AB 2-0 CT1 27 (SUTURE) ×4
SUT VIC AB 2-0 CT1 TAPERPNT 27 (SUTURE) ×2 IMPLANT
SUT VLOC 180 0 24IN GS25 (SUTURE) ×2 IMPLANT
SYR 50ML LL SCALE MARK (SYRINGE) ×2 IMPLANT
TAPE STRIPS DRAPE STRL (GAUZE/BANDAGES/DRESSINGS) ×2 IMPLANT
TOWER CARTRIDGE SMART MIX (DISPOSABLE) ×2 IMPLANT
TRAY FOLEY W/METER SILVER 16FR (SET/KITS/TRAYS/PACK) ×2 IMPLANT
WATER STERILE IRR 1500ML POUR (IV SOLUTION) ×4 IMPLANT
WRAP KNEE MAXI GEL POST OP (GAUZE/BANDAGES/DRESSINGS) ×2 IMPLANT
YANKAUER SUCT BULB TIP 10FT TU (MISCELLANEOUS) ×2 IMPLANT

## 2016-10-28 NOTE — Transfer of Care (Signed)
Immediate Anesthesia Transfer of Care Note  Patient: Gina Ryan  Procedure(s) Performed: Procedure(s): LEFT TOTAL KNEE ARTHROPLASTY (Left)  Patient Location: PACU  Anesthesia Type:Spinal  Level of Consciousness: awake, alert , oriented and patient cooperative  Airway & Oxygen Therapy: Patient Spontanous Breathing and Patient connected to face mask oxygen  Post-op Assessment: Report given to RN and Post -op Vital signs reviewed and stable  Post vital signs: stable  Last Vitals:  Vitals:   10/28/16 0547  BP: 135/76  Pulse: 78  Resp: 18  Temp: 36.4 C    Last Pain:  Vitals:   10/28/16 0547  TempSrc: Oral      Patients Stated Pain Goal: 3 ([id number])  Complications: No apparent anesthesia complications

## 2016-10-28 NOTE — Anesthesia Procedure Notes (Addendum)
Anesthesia Regional Block:  Adductor canal block  Pre-Anesthetic Checklist: ,, timeout performed, Correct Patient, Correct Site, Correct Laterality, Correct Procedure, Correct Position, site marked, Risks and benefits discussed,  Surgical consent,  Pre-op evaluation,  At surgeon's request and post-op pain management  Laterality: Left  Prep: chloraprep       Needles:  Injection technique: Single-shot  Needle Type: Stimiplex     Needle Length: 9cm 9 cm Needle Gauge: 21 and 21 G    Additional Needles:  Procedures: ultrasound guided (picture in chart) Adductor canal block Narrative:  Start time: 10/28/2016 7:12 AM End time: 10/28/2016 7:17 AM Injection made incrementally with aspirations every 5 mL.  Performed by: Personally  Anesthesiologist: Nolon Nations  Additional Notes: BP cuff, EKG monitors applied. Sedation begun. Artery and nerve location verified with U/S and anesthetic injected incrementally, slowly, and after negative aspirations under direct u/s guidance. Good fascial /perineural spread. Tolerated well.

## 2016-10-28 NOTE — Op Note (Signed)
DATE OF SURGERY:  10/28/2016  TIME: 9:54 AM  PATIENT NAME:  Gina Ryan    AGE: 60 y.o.   PRE-OPERATIVE DIAGNOSIS:  LEFT KNEE OA  POST-OPERATIVE DIAGNOSIS:  LEFT KNEE OA  PROCEDURE:  Procedure(s): LEFT TOTAL KNEE ARTHROPLASTY  SURGEON:  Kasy Iannacone ANDREW  ASSISTANT:  Bryson Stilwell, PA-C, present and scrubbed throughout the case, critical for assistance with exposure, retraction, instrumentation, and closure.  OPERATIVE IMPLANTS: Depuy PFC Sigma Rotating Platform.  Femur size 4, Tibia size 4, Patella size 38 3-peg oval button, with a 10 mm polyethylene insert.   PREOPERATIVE INDICATIONS:   Gina Ryan is a 60 y.o. year old female with end stage bone on bone arthritis of the knee who failed conservative treatment and elected for Total Knee Arthroplasty.   The risks, benefits, and alternatives were discussed at length including but not limited to the risks of infection, bleeding, nerve injury, stiffness, blood clots, the need for revision surgery, cardiopulmonary complications, among others, and they were willing to proceed.  OPERATIVE DESCRIPTION:  The patient was brought to the operative room and placed in a supine position.  Spinal anesthesia was administered.  IV antibiotics were given.  The lower extremity was prepped and draped in the usual sterile fashion.  Time out was performed.  The leg was elevated and exsanguinated and the tourniquet was inflated.  Anterior quadriceps tendon splitting approach was performed.  The patella was retracted and osteophytes were removed.  The anterior horn of the medial and lateral meniscus was removed and cruciate ligaments resected.   The distal femur was opened with the drill and the intramedullary distal femoral cutting jig was utilized, set at 5 degrees resecting 10 mm off the distal femur.  Care was taken to protect the collateral ligaments.  The distal femoral sizing jig was applied, taking care to avoid notching.  Then the  4-in-1 cutting jig was applied and the anterior and posterior femur was cut, along with the chamfer cuts.    Then the extramedullary tibial cutting jig was utilized making the appropriate cut using the anterior tibial crest as a reference building in appropriate posterior slope.  Care was taken during the cut to protect the medial and collateral ligaments.  The proximal tibia was removed along with the posterior horns of the menisci.   The posterior medial femoral osteophytes and posterior lateral femoral osteophytes were removed.    The flexion gap was then measured and was symmetric with the extension gap, measured at 10.  I completed the distal femoral preparation using the appropriate jig to prepare the box.  The patella was then measured, and cut with the saw.    The proximal tibia sized and prepared accordingly with the reamer and the punch, and then all components were trialed with the trial insert.  The knee was found to have excellent balance and full motion.    The above named components were then cemented into place and all excess cement was removed.  The trial polyethylene component was in place during cementation, and then was exchanged for the real polyethylene component.    The knee was easily taken through a range of motion and the patella tracked well and the knee irrigated copiously and the parapatellar and subcutaneous tissue closed with vicryl, and monocryl with steri strips for the skin.  The arthrotomy was closed at 90 of flexion. The wounds were dressed with sterile gauze and the tourniquet released and the patient was awakened and returned to the PACU  in stable and satisfactory condition.  There were no complications.  Total tourniquet time was 90 minutes.

## 2016-10-28 NOTE — Anesthesia Postprocedure Evaluation (Signed)
Anesthesia Post Note  Patient: Gina Ryan  Procedure(s) Performed: Procedure(s) (LRB): LEFT TOTAL KNEE ARTHROPLASTY (Left)  Patient location during evaluation: PACU Anesthesia Type: Spinal and Regional Level of consciousness: awake and alert Pain management: pain level controlled Vital Signs Assessment: post-procedure vital signs reviewed and stable Respiratory status: spontaneous breathing and respiratory function stable Cardiovascular status: blood pressure returned to baseline and stable Postop Assessment: spinal receding Anesthetic complications: no       Last Vitals:  Vitals:   10/28/16 1230 10/28/16 1233  BP: 119/68   Pulse: (!) 47 (!) 50  Resp: 11   Temp:      Last Pain:  Vitals:   10/28/16 0547  TempSrc: Oral                 Nolon Nations

## 2016-10-28 NOTE — Anesthesia Procedure Notes (Signed)
Spinal  Patient location during procedure: OR End time: 10/28/2016 7:55 AM Staffing Resident/CRNA: Enrigue Catena E Performed: anesthesiologist  Preanesthetic Checklist Completed: patient identified, site marked, surgical consent, pre-op evaluation, timeout performed, IV checked, risks and benefits discussed and monitors and equipment checked Spinal Block Patient position: sitting Prep: DuraPrep Patient monitoring: heart rate, continuous pulse ox and blood pressure Approach: right paramedian Location: L3-4 Injection technique: single-shot Needle Needle type: Sprotte  Needle gauge: 24 G Needle length: 9 cm Additional Notes Expiration date of kit checked and confirmed. Patient tolerated procedure well, without complications.

## 2016-10-28 NOTE — H&P (View-Only) (Signed)
Pre visit review using our clinic review tool, if applicable. No additional management support is needed unless otherwise documented below in the visit note. 

## 2016-10-28 NOTE — Interval H&P Note (Signed)
History and Physical Interval Note:  10/28/2016 7:39 AM  Gina Ryan  has presented today for surgery, with the diagnosis of LEFT KNEE OA  The various methods of treatment have been discussed with the patient and family. After consideration of risks, benefits and other options for treatment, the patient has consented to  Procedure(s): LEFT TOTAL KNEE ARTHROPLASTY (Left) as a surgical intervention .  The patient's history has been reviewed, patient examined, no change in status, stable for surgery.  I have reviewed the patient's chart and labs.  Questions were answered to the patient's satisfaction.     Srishti Strnad ANDREW

## 2016-10-29 LAB — BASIC METABOLIC PANEL
Anion gap: 9 (ref 5–15)
BUN: 14 mg/dL (ref 6–20)
CALCIUM: 8.4 mg/dL — AB (ref 8.9–10.3)
CO2: 25 mmol/L (ref 22–32)
CREATININE: 0.55 mg/dL (ref 0.44–1.00)
Chloride: 104 mmol/L (ref 101–111)
GFR calc Af Amer: >60 mL/min (ref >60)
GLUCOSE: 131 mg/dL — AB (ref 65–99)
Potassium: 3.8 mmol/L (ref 3.5–5.1)
SODIUM: 138 mmol/L (ref 135–145)

## 2016-10-29 LAB — CBC
HCT: 32.4 % — ABNORMAL LOW (ref 36.0–46.0)
Hemoglobin: 10.6 g/dL — ABNORMAL LOW (ref 12.0–15.0)
MCH: 27.2 pg (ref 26.0–34.0)
MCHC: 32.7 g/dL (ref 30.0–36.0)
MCV: 83.1 fL (ref 78.0–100.0)
PLATELETS: 260 10*3/uL (ref 150–400)
RBC: 3.9 MIL/uL (ref 3.87–5.11)
RDW: 13.8 % (ref 11.5–15.5)
WBC: 12.9 10*3/uL — ABNORMAL HIGH (ref 4.0–10.5)

## 2016-10-29 MED ORDER — ACETAMINOPHEN-CODEINE #3 300-30 MG PO TABS
2.0000 | ORAL_TABLET | Freq: Four times a day (QID) | ORAL | Status: DC | PRN
  Administered 2016-10-29 – 2016-10-30 (×4): 2 via ORAL
  Filled 2016-10-29 (×5): qty 2

## 2016-10-29 MED ORDER — OXYCODONE HCL 5 MG/5ML PO SOLN
10.0000 mg | ORAL | Status: DC | PRN
  Administered 2016-10-29 – 2016-10-30 (×5): 10 mg via ORAL
  Filled 2016-10-29 (×4): qty 10

## 2016-10-29 MED ORDER — HYDROMORPHONE HCL 1 MG/ML IJ SOLN
0.5000 mg | INTRAMUSCULAR | Status: DC | PRN
  Administered 2016-10-29: 0.5 mg via INTRAVENOUS
  Filled 2016-10-29: qty 0.5

## 2016-10-29 NOTE — Evaluation (Signed)
Physical Therapy Evaluation Patient Details Name: Gina Ryan MRN: [id number] DOB: [birth year] Today's Date: 10/29/2016   History of Present Illness  Pt s/p L TKR  Clinical Impression  Pt s/p L TKR presents with decreased L LE strength/ROM and post op pain limiting functional mobility.  Pt should progress to dc home with family assist and HHPT follow up.    Follow Up Recommendations Home health PT    Equipment Recommendations  Rolling walker with 5" wheels (wide RW please)    Recommendations for Other Services OT consult     Precautions / Restrictions Precautions Precautions: Knee;Fall Required Braces or Orthoses: Knee Immobilizer - Left Knee Immobilizer - Left: Discontinue once straight leg raise with < 10 degree lag Restrictions Weight Bearing Restrictions: No Other Position/Activity Restrictions: WBAT      Mobility  Bed Mobility Overal bed mobility: Needs Assistance Bed Mobility: Supine to Sit     Supine to sit: Min assist     General bed mobility comments: cues for sequence and use of R LE to self assist.  Pt utilizing rails to complete task  Transfers Overall transfer level: Needs assistance Equipment used: Rolling walker (2 wheeled) Transfers: Sit to/from Stand Sit to Stand: Min assist;From elevated surface         General transfer comment: cues for LE management and use of UEs to self assist  Ambulation/Gait Ambulation/Gait assistance: Min assist Ambulation Distance (Feet): 50 Feet Assistive device: Rolling walker (2 wheeled) Gait Pattern/deviations: Step-to pattern;Decreased step length - right;Decreased step length - left;Shuffle;Trunk flexed Gait velocity: decr Gait velocity interpretation: Below normal speed for age/gender General Gait Details: cues for sequence, posture and position from [employer]            Wheelchair Mobility    Modified Rankin (Stroke Patients Only)       Balance                                             Pertinent Vitals/Pain Pain Assessment: 0-10 Pain Score: 2  Pain Location: L knee Pain Descriptors / Indicators: Aching;Sore Pain Intervention(s): Limited activity within patient's tolerance;Monitored during session;Premedicated before session;Ice applied    Home Living Family/patient expects to be discharged to:: Private residence Living Arrangements: Alone Available Help at Discharge: Family Type of Home: House Home Access: Stairs to enter Entrance Stairs-Rails: Psychiatric nurse of Steps: 5 Home Layout: One level Home Equipment: Environmental consultant - 4 wheels      Prior Function Level of Independence: Independent               Hand Dominance        Extremity/Trunk Assessment   Upper Extremity Assessment Upper Extremity Assessment: Overall WFL for tasks assessed    Lower Extremity Assessment Lower Extremity Assessment: LLE deficits/detail;RLE deficits/detail RLE Deficits / Details: R knee flex ltd 2* OA changes LLE Deficits / Details: 2+/5 quads with AAROM at knee -10- 40    Cervical / Trunk Assessment Cervical / Trunk Assessment: Normal  Communication   Communication: No difficulties  Cognition Arousal/Alertness: Awake/alert Behavior During Therapy: WFL for tasks assessed/performed Overall Cognitive Status: Within Functional Limits for tasks assessed                      General Comments      Exercises Total Joint Exercises Ankle Circles/Pumps: AROM;Both;15 reps;Supine Quad Sets:  AROM;Both;10 reps;Supine Heel Slides: AAROM;Left;15 reps;Supine Straight Leg Raises: AAROM;Left;10 reps;Supine   Assessment/Plan    PT Assessment Patient needs continued PT services  PT Problem List Decreased strength;Decreased range of motion;Decreased activity tolerance;Decreased mobility;Decreased knowledge of use of DME;Pain;Obesity;Decreased knowledge of precautions          PT Treatment Interventions DME instruction;Gait  training;Stair training;Functional mobility training;Therapeutic activities;Therapeutic exercise;Patient/family education    PT Goals (Current goals can be found in the Care Plan section)  Acute Rehab PT Goals Patient Stated Goal: Regain IND and get the other knee done PT Goal Formulation: With patient Time For Goal Achievement: 11/05/16 Potential to Achieve Goals: Good    Frequency 7X/week   Barriers to discharge        Co-evaluation               End of Session Equipment Utilized During Treatment: Gait belt;Left knee immobilizer Activity Tolerance: Patient tolerated treatment well Patient left: in chair;with call bell/phone within reach;with chair alarm set Nurse Communication: Mobility status         Time: [id number] PT Time Calculation (min) (ACUTE ONLY): 38 min   Charges:   PT Evaluation $PT Eval Low Complexity: 1 Procedure PT Treatments $Gait Training: 8-22 mins $Therapeutic Exercise: 8-22 mins   PT G Codes:        Sabrinia Prien 11-17-2016, 12:25 PM

## 2016-10-29 NOTE — Progress Notes (Signed)
Pt requesting Tylenol # 3 for pain and states was under impression that she had been receiving Tylenol # 3 q 4 hours for pain. Explained to patient that Tylenol # 3 is currently not ordered. Tylenol 650mg  q 6hr ordered and last received 3:30am. Pt refused Dilaudid IV. States takes Tylenol # 3 at home. Advised patient that PA would be called to request order for Tylenol # 3 for pain. Pt verbalized understanding.

## 2016-10-29 NOTE — Progress Notes (Signed)
   Subjective: 1 Day Post-Op Procedure(s) (LRB): LEFT TOTAL KNEE ARTHROPLASTY (Left)  C/o mild to moderate pain Denies any new symptoms or issues this morning Patient reports pain as moderate.  Objective:   VITALS:   Vitals:   10/29/16 0128 10/29/16 0515  BP: 123/65 (!) 123/52  Pulse: (!) 51 63  Resp: 16 18  Temp: 97.7 F (36.5 C) 97.9 F (36.6 C)    Left knee dressing intact Drain pulled nv intact distally No rashes or edema  LABS  Recent Labs  10/29/16 0450  HGB 10.6*  HCT 32.4*  WBC 12.9*  PLT 260     Recent Labs  10/29/16 0450  NA 138  K 3.8  BUN 14  CREATININE 0.55  GLUCOSE 131*     Assessment/Plan: 1 Day Post-Op Procedure(s) (LRB): LEFT TOTAL KNEE ARTHROPLASTY (Left) PT/OT Pulmonary toilet Possible d/c tomorrow vs Monday depending on progress Pain management as needed    Merla Riches, MPAS, PA-C  10/29/2016, 8:03 AM

## 2016-10-29 NOTE — Progress Notes (Signed)
Physical Therapy Treatment Patient Details Name: Gina Ryan MRN: [id number] DOB: 1956/05/05 Today's Date: 10/29/2016    History of Present Illness Pt s/p L TKR    PT Comments    Pt motivated and progressing well with mobility.  Follow Up Recommendations  Home health PT     Equipment Recommendations  Rolling walker with 5" wheels    Recommendations for Other Services OT consult     Precautions / Restrictions Precautions Precautions: Knee;Fall Required Braces or Orthoses: Knee Immobilizer - Left Knee Immobilizer - Left: Discontinue once straight leg raise with < 10 degree lag Restrictions Weight Bearing Restrictions: No Other Position/Activity Restrictions: WBAT    Mobility  Bed Mobility Overal bed mobility: Needs Assistance Bed Mobility: Sit to Supine       Sit to supine: Min assist   General bed mobility comments: NT -- up in recliner  Transfers Overall transfer level: Needs assistance Equipment used: Rolling walker (2 wheeled) Transfers: Sit to/from Stand Sit to Stand: Min assist         General transfer comment: cues for LE management and use of UEs to self assist  Ambulation/Gait Ambulation/Gait assistance: Min guard Ambulation Distance (Feet): 90 Feet Assistive device: Rolling walker (2 wheeled) Gait Pattern/deviations: Step-to pattern;Decreased step length - right;Decreased step length - left;Shuffle;Trunk flexed Gait velocity: decr Gait velocity interpretation: Below normal speed for age/gender General Gait Details: cues for sequence, posture and position from [employer]            Wheelchair Mobility    Modified Rankin (Stroke Patients Only)       Balance                                    Cognition Arousal/Alertness: Awake/alert Behavior During Therapy: WFL for tasks assessed/performed Overall Cognitive Status: Within Functional Limits for tasks assessed                      Exercises Total  Joint Exercises Ankle Circles/Pumps: AROM;Both;15 reps;Supine Quad Sets: AROM;Both;10 reps;Supine Heel Slides: AAROM;Left;15 reps;Supine Straight Leg Raises: Left;Supine;AAROM;AROM;15 reps    General Comments        Pertinent Vitals/Pain Pain Assessment: 0-10 Pain Score: 4  Pain Location: L knee Pain Descriptors / Indicators: Aching;Sore Pain Intervention(s): Limited activity within patient's tolerance;Monitored during session;Premedicated before session;Ice applied    Home Living Family/patient expects to be discharged to:: Private residence Living Arrangements: Alone Available Help at Discharge: Family Type of Home: House Home Access: Stairs to enter Entrance Stairs-Rails: Right;Left Home Layout: One level Home Equipment: Environmental consultant - 4 wheels Additional Comments: daughter to stay with patient x 3 weeks     Prior Function Level of Independence: Independent          PT Goals (current goals can now be found in the care plan section) Acute Rehab PT Goals Patient Stated Goal: Regain IND and get the other knee done PT Goal Formulation: With patient Time For Goal Achievement: 11/05/16 Potential to Achieve Goals: Good Progress towards PT goals: Progressing toward goals    Frequency    7X/week      PT Plan Current plan remains appropriate    Co-evaluation             End of Session Equipment Utilized During Treatment: Gait belt;Left knee immobilizer Activity Tolerance: Patient tolerated treatment well Patient left: in bed;with call bell/phone within reach  Time: [id number] PT Time Calculation (min) (ACUTE ONLY): 33 min  Charges:  $Gait Training: 8-22 mins $Therapeutic Exercise: 8-22 mins                    G Codes:      Destinie Thornsberry 2016/11/17, 4:03 PM

## 2016-10-29 NOTE — Progress Notes (Signed)
Occupational Therapy Evaluation Patient Details Name: Gina Ryan MRN: [id number] DOB: [birth year] Today's Date: 10/29/2016    History of Present Illness Pt s/p L TKR   Clinical Impression   Patient presents to OT with decreased ADL independence due to above procedure. Will benefit from skilled OT to maximize function prior to d/c home with daughter's assistance. OT will follow.    Follow Up Recommendations  No OT follow up;Supervision/Assistance - 24 hour    Equipment Recommendations  3 in 1 bedside commode    Recommendations for Other Services       Precautions / Restrictions Precautions Precautions: Knee;Fall Required Braces or Orthoses: Knee Immobilizer - Left Knee Immobilizer - Left: Discontinue once straight leg raise with < 10 degree lag Restrictions Weight Bearing Restrictions: No Other Position/Activity Restrictions: WBAT      Mobility Bed Mobility            General bed mobility comments: NT -- up in recliner  Transfers             General transfer comment: pt declined due to pain    Balance                                            ADL Overall ADL's : Needs assistance/impaired Eating/Feeding: Independent;Sitting   Grooming: Set up;Sitting   Upper Body Bathing: Minimal assistance;Sitting   Lower Body Bathing: Moderate assistance;Sit to/from stand   Upper Body Dressing : Minimal assistance;Sitting   Lower Body Dressing: Moderate assistance;Sit to/from Health and safety inspector Details (indicate cue type and reason): NT -- still has foley catheter           General ADL Comments: Patient received up in recliner. Educated on LB self-care techniques for home. Daughter will be there to assist patient as needed. Also discussed use of 3 in 1 over toilet and in walk-in shower. Patient verbalized understanding and would benefit from 3 in 1 for home use. Patient reports increased knee pain since PT session, but  verbalized that she was not due for pain medication until 1330. Will practice toilet and shower transfers next session.     Vision     Perception     Praxis      Pertinent Vitals/Pain Pain Assessment: 0-10 Pain Score: 5  Pain Location: L knee Pain Descriptors / Indicators: Aching;Sore Pain Intervention(s): Limited activity within patient's tolerance;Monitored during session     Hand Dominance Right   Extremity/Trunk Assessment Upper Extremity Assessment Upper Extremity Assessment: Overall WFL for tasks assessed   Lower Extremity Assessment Lower Extremity Assessment: Defer to PT evaluation    Cervical / Trunk Assessment Cervical / Trunk Assessment: Normal   Communication Communication Communication: No difficulties   Cognition Arousal/Alertness: Awake/alert Behavior During Therapy: WFL for tasks assessed/performed Overall Cognitive Status: Within Functional Limits for tasks assessed                     General Comments       Exercises      Shoulder Instructions      Home Living Family/patient expects to be discharged to:: Private residence Living Arrangements: Alone Available Help at Discharge: Family Type of Home: House Home Access: Stairs to enter Technical brewer of Steps: 5 Entrance Stairs-Rails: Right;Left Home Layout: One level     Bathroom Shower/Tub: Gaffer  Bathroom Toilet: Programmer, systems: Yes How Accessible: Accessible via walker Home Equipment: Walker - 4 wheels   Additional Comments: daughter to stay with patient x 3 weeks       Prior Functioning/Environment Level of Independence: Independent                 OT Problem List: Decreased strength;Decreased range of motion;Decreased activity tolerance;Decreased knowledge of use of DME or AE;Pain   OT Treatment/Interventions: Self-care/ADL training;DME and/or AE instruction;Therapeutic activities;Patient/family education    OT  Goals(Current goals can be found in the care plan section) Acute Rehab OT Goals Patient Stated Goal: Regain IND and get the other knee done OT Goal Formulation: With patient Time For Goal Achievement: 11/12/16 Potential to Achieve Goals: Good  OT Frequency: Min 2X/week   Barriers to D/C:            Co-evaluation              End of Session    Activity Tolerance: Patient limited by pain Patient left: in chair;with call bell/phone within reach;with chair alarm set   Time: [id number] OT Time Calculation (min): 15 min Charges:  OT General Charges $OT Visit: 1 Procedure OT Evaluation $OT Eval Low Complexity: 1 Procedure G-Codes:    Jaquawn Saffran A 11-14-2016, 1:18 PM

## 2016-10-30 LAB — CBC
HEMATOCRIT: 29.4 % — AB (ref 36.0–46.0)
HEMOGLOBIN: 9.7 g/dL — AB (ref 12.0–15.0)
MCH: 27.1 pg (ref 26.0–34.0)
MCHC: 33 g/dL (ref 30.0–36.0)
MCV: 82.1 fL (ref 78.0–100.0)
Platelets: 222 10*3/uL (ref 150–400)
RBC: 3.58 MIL/uL — ABNORMAL LOW (ref 3.87–5.11)
RDW: 13.7 % (ref 11.5–15.5)
WBC: 10.8 10*3/uL — ABNORMAL HIGH (ref 4.0–10.5)

## 2016-10-30 NOTE — Progress Notes (Signed)
Gina Ryan  MRN: [id number] DOB/Age: April 18, 1956 60 y.o. Physician: Theda Sers Procedure: Procedure(s) (LRB): LEFT TOTAL KNEE ARTHROPLASTY (Left)     Subjective: Up in chair, tolerating solid foods. Voiding well and pain better controlled. No BM yet  Vital Signs Temp:  [97.9 F (36.6 C)-98.5 F (36.9 C)] 98.5 F (36.9 C) (12/31 0558) Pulse Rate:  [71-97] 97 (12/31 0558) Resp:  [18-20] 18 (12/31 0558) BP: (113-135)/(54-67) 121/54 (12/31 0558) SpO2:  [99 %-100 %] 99 % (12/31 0558)  Lab Results  Recent Labs  10/29/16 0450 10/30/16 0459  WBC 12.9* 10.8*  HGB 10.6* 9.7*  HCT 32.4* 29.4*  PLT 260 222   BMET  Recent Labs  10/29/16 0450  NA 138  K 3.8  CL 104  CO2 25  GLUCOSE 131*  BUN 14  CREATININE 0.55  CALCIUM 8.4*   INR  Date Value Ref Range Status  10/21/2016 0.96  Final     Exam Ace wrap removed. Scant old bloody drainage to aquacel noted. [place] home as has met DC goals HHPT FU as outpt  [name] PA-C   10/30/2016, 9:18 AM Contact # [phone]

## 2016-10-30 NOTE — Care Management Note (Signed)
Case Management Note  Patient Details  Name: Gina Ryan MRN: [id number] Date of Birth: 1956/02/05  Subjective/Objective:  S/p L TKA                  Action/Plan: Discharge Planning: AVS reviewed:  NCM spoke to pt and she has RW at home. Requesting wide 3n1 bedside commode. Contacted [facility] DME rep for DME. Preoperatively arranged with [facility] for [facility] PT.   Expected Discharge Date:  10/30/2016             Expected Discharge Plan:  [place]  In-House Referral:  NA  Discharge planning Services  CM Consult  Post Acute Care Choice:  Home Health Choice offered to:  Patient  DME Arranged:  3-N-1 DME Agency:  [place]:  PT [place] Agency:  [facility]  Status of Service:     If discussed at [employer], dates discussed:    Additional Comments:  Erenest Rasher, RN 10/30/2016, 10:29 AM

## 2016-10-30 NOTE — Progress Notes (Signed)
Occupational Therapy Treatment Patient Details Name: SAFIYYAH VASCONEZ MRN: [id number] DOB: Oct 27, 1956 Today's Date: 10/30/2016    History of present illness Pt s/p L TKR   OT comments  All OT goals addressed and daughter to assist pt at discharge. OT will sign off as all education completed.  Follow Up Recommendations  No OT follow up;Supervision/Assistance - 24 hour    Equipment Recommendations  3 in 1 bedside commode    Recommendations for Other Services      Precautions / Restrictions Precautions Precautions: Knee;Fall Required Braces or Orthoses: Knee Immobilizer - Left Knee Immobilizer - Left: Discontinue once straight leg raise with < 10 degree lag Restrictions Weight Bearing Restrictions: No Other Position/Activity Restrictions: WBAT       Mobility Bed Mobility Overal bed mobility: Needs Assistance Bed Mobility: Supine to Sit;Sit to Supine     Supine to sit: Min assist Sit to supine: Min guard      Transfers Overall transfer level: Needs assistance Equipment used: Rolling walker (2 wheeled) Transfers: Sit to/from Stand Sit to Stand: Supervision              Balance                                   ADL Overall ADL's : Needs assistance/impaired                 Upper Body Dressing : Set up;Sitting   Lower Body Dressing: Minimal assistance;Sit to/from stand   Toilet Transfer: Supervision/safety;Ambulation;BSC;RW   Toileting- Clothing Manipulation and Hygiene: Supervision/safety;Sit to/from stand   Tub/ Shower Transfer: Walk-in shower;Min guard;Ambulation;3 in 1;Rolling walker   Functional mobility during ADLs: Supervision/safety;Rolling walker        Vision                     Perception     Praxis      Cognition   Behavior During Therapy: WFL for tasks assessed/performed Overall Cognitive Status: Within Functional Limits for tasks assessed                       Extremity/Trunk Assessment                Exercises     Shoulder Instructions       General Comments      Pertinent Vitals/ Pain       Pain Assessment: 0-10 Pain Score: 4  Pain Location: L knee Pain Descriptors / Indicators: Aching;Sore Pain Intervention(s): Limited activity within patient's tolerance;Monitored during session;Ice applied  Home Living                                          Prior Functioning/Environment              Frequency           Progress Toward Goals  OT Goals(current goals can now be found in the care plan section)  Progress towards OT goals: Goals met/education completed, patient discharged from [place] All goals met and education completed, patient discharged from OT services    Co-evaluation                 End of Session Equipment Utilized During Treatment: Rolling walker   Activity  Tolerance Patient tolerated treatment well   Patient Left in bed;with call bell/phone within reach;with family/visitor present   Nurse Communication Mobility status        Time: 1010-1019 OT Time Calculation (min): 9 min  Charges: OT General Charges $OT Visit: 1 Procedure OT Treatments $Self Care/Home Management : 8-22 mins  Cherelle Midkiff A 10/30/2016, 10:23 AM

## 2016-10-30 NOTE — Progress Notes (Signed)
Physical Therapy Treatment Patient Details Name: Gina Ryan MRN: [id number] DOB: 1956-09-17 Today's Date: 10/30/2016    History of Present Illness Pt s/p L TKR    PT Comments    The patient is progressing well. Ready for DC.   Follow Up Recommendations  Home health PT     Equipment Recommendations  Rolling walker with 5" wheels    Recommendations for Other Services       Precautions / Restrictions Precautions Precautions: Knee;Fall Required Braces or Orthoses: Knee Immobilizer - Left Knee Immobilizer - Left: Discontinue once straight leg raise with < 10 degree lag    Mobility  Bed Mobility   Bed Mobility: Sit to Supine       Sit to supine: Mod assist   General bed mobility comments: practiced  getting onto high bed with a stool, required assistance for the left leg onto the bed.  Transfers Overall transfer level: Needs assistance Equipment used: Rolling walker (2 wheeled) Transfers: Sit to/from Stand Sit to Stand: Supervision         General transfer comment: cues for LE management and use of UEs to self assist  Ambulation/Gait Ambulation/Gait assistance: Min guard Ambulation Distance (Feet): 60 Feet Assistive device: Rolling walker (2 wheeled) Gait Pattern/deviations: Step-to pattern;Step-through pattern Gait velocity: decr   General Gait Details: cues for sequence, posture and position from RW   Stairs Stairs: Yes   Stair Management: One rail Right;Step to pattern;Forwards;With crutches Number of Stairs: 2 General stair comments: cues for sequence. Daughter instructed at a different time.  Wheelchair Mobility    Modified Rankin (Stroke Patients Only)       Balance                                    Cognition                            Exercises Total Joint Exercises Ankle Circles/Pumps: AROM Quad Sets: AROM;Both;10 reps;Supine Towel Squeeze: AROM Heel Slides: AAROM;Left;Supine;10 reps Hip  ABduction/ADduction: AAROM;Left;Supine;10 reps Straight Leg Raises: Left;Supine;AAROM;AROM;10 reps Goniometric ROM: 10-50 left knee flexion    General Comments        Pertinent Vitals/Pain Pain Score: 4  Pain Location: L knee Pain Descriptors / Indicators: Aching;Sore Pain Intervention(s): Limited activity within patient's tolerance;Premedicated before session;Ice applied    Home Living                      Prior Function            PT Goals (current goals can now be found in the care plan section) Progress towards PT goals: Progressing toward goals    Frequency    7X/week      PT Plan Current plan remains appropriate    Co-evaluation             End of Session Equipment Utilized During Treatment: Gait belt;Left knee immobilizer Activity Tolerance: Patient tolerated treatment well Patient left: with call bell/phone within reach;with nursing/sitter in room     Time: 0922-0955 PT Time Calculation (min) (ACUTE ONLY): 33 min  Charges:  $Gait Training: 8-22 mins $Therapeutic Exercise: 8-22 mins                    G Codes:      Gina Ryan 10/30/2016, 2:07 PM

## 2016-11-01 ENCOUNTER — Encounter (HOSPITAL_COMMUNITY): Payer: Self-pay | Admitting: Specialist

## 2016-11-01 LAB — TYPE AND SCREEN
BLOOD PRODUCT EXPIRATION DATE: 201801102359
Blood Product Expiration Date: 201801102359
Unit Type and Rh: 5100
Unit Type and Rh: 5100

## 2016-11-18 NOTE — Discharge Summary (Signed)
Physician Discharge Summary  Patient ID: Gina Ryan MRN: [id number] DOB/AGE: [birth year] 61 y.o.  Admit date: 10/28/2016 Discharge date: 11/18/2016  Admission Diagnoses: left knee primary OA  Discharge Diagnoses:  Active Problems:   S/P knee replacement   Discharged Condition:good  Hospital Course: Gina Ryan is a 61 y.o. who was admitted to [facility] They were brought to the operating room on 10/28/2016 and underwent Procedure(s): LEFT TOTAL KNEE ARTHROPLASTY.  Patient tolerated the procedure well and was later transferred to the recovery room and then to the orthopaedic floor for postoperative care.  They were given PO and IV analgesics for pain control following their surgery.  They were given 24 hours of postoperative antibiotics of  Anti-infectives    Start     Dose/Rate Route Frequency Ordered Stop   10/28/16 1400  ceFAZolin (ANCEF) IVPB 2g/100 mL premix     2 g 200 mL/hr over 30 Minutes Intravenous Every 6 hours 10/28/16 1255 10/28/16 2037   10/28/16 0535  ceFAZolin (ANCEF) IVPB 2g/100 mL premix     2 g 200 mL/hr over 30 Minutes Intravenous On call to O.R. 10/28/16 [id number] 10/28/16 0757     and started on DVT prophylaxis in the form of lovenox.   PT and OT were ordered for total joint protocol.  Discharge planning consulted to help with postop disposition and equipment needs.  Patient had a good night on the evening of surgery and started to get up OOB with therapy on day one.  Hemovac drain was pulled without difficulty.  Continued to work with therapy into day two.  Dressing was with normal limits.  The patient had progressed with therapy and meeting their goals. Patient was seen in rounds and was ready to go home.  Consults:n/a  Significant Diagnostic Studies: routine  Treatments:routine  Discharge Exam: Blood pressure 120/70, pulse 98, temperature 98.5 F (36.9 C), temperature source Oral, resp. rate 18, height 5\' 6"  (1.676 m), weight 115.7 kg (255 lb),  SpO2 99 %. Alert and oriented x3. RRR, Lungs clear, BS x4. Left Calf soft and non tender. L knee dressing C/D/I. No DVT signs. No signs of infection or compartment syndrome. LLE grossly neurovascularly intact.   Disposition: 01-Home or Self Care  Discharge Instructions    Call MD / Call 911    Complete by:  As directed    If you experience chest pain or shortness of breath, CALL 911 and be transported to the hospital emergency room.  If you develope a fever above 101 F, pus (white drainage) or increased drainage or redness at the wound, or calf pain, call your surgeon's office.   Constipation Prevention    Complete by:  As directed    Drink plenty of fluids.  Prune juice may be helpful.  You may use a stool softener, such as Colace (over the counter) 100 mg twice a day.  Use MiraLax (over the counter) for constipation as needed.   Diet - low sodium heart healthy    Complete by:  As directed    Discharge instructions    Complete by:  As directed    INSTRUCTIONS AFTER JOINT REPLACEMENT   Remove items at home which could result in a fall. This includes throw rugs or furniture in walking pathways ICE to the affected joint every three hours while awake for 30 minutes at a time, for at least the first 3-5 days, and then as needed for pain and swelling.  Continue to use ice for  pain and swelling. You may notice swelling that will progress down to the foot and ankle.  This is normal after surgery.  Elevate your leg when you are not up walking on it.   Continue to use the breathing machine you got in the hospital (incentive spirometer) which will help keep your temperature down.  It is common for your temperature to cycle up and down following surgery, especially at night when you are not up moving around and exerting yourself.  The breathing machine keeps your lungs expanded and your temperature down.   DIET:  As you were doing prior to hospitalization, we recommend a well-balanced diet.  DRESSING  / WOUND CARE / SHOWERING  Keep the surgical dressing until follow up.  The dressing is water proof, so you can shower without any extra covering.  IF THE DRESSING FALLS OFF or the wound gets wet inside, change the dressing with sterile gauze.  Please use good hand washing techniques before changing the dressing.  Do not use any lotions or creams on the incision until instructed by your surgeon.    ACTIVITY  Increase activity slowly as tolerated, but follow the weight bearing instructions below.   No driving for 6 weeks or until further direction given by your physician.  You cannot drive while taking narcotics.  No lifting or carrying greater than 10 lbs. until further directed by your surgeon. Avoid periods of inactivity such as sitting longer than an hour when not asleep. This helps prevent blood clots.  You may return to work once you are authorized by your doctor.     WEIGHT BEARING   Weight bearing as tolerated with assist device (walker, cane, etc) as directed, use it as long as suggested by your surgeon or therapist, typically at least 4-6 weeks.   EXERCISES  Results after joint replacement surgery are often greatly improved when you follow the exercise, range of motion and muscle strengthening exercises prescribed by your doctor. Safety measures are also important to protect the joint from further injury. Any time any of these exercises cause you to have increased pain or swelling, decrease what you are doing until you are comfortable again and then slowly increase them. If you have problems or questions, call your caregiver or physical therapist for advice.   Rehabilitation is important following a joint replacement. After just a few days of immobilization, the muscles of the leg can become weakened and shrink (atrophy).  These exercises are designed to build up the tone and strength of the thigh and leg muscles and to improve motion. Often times heat used for twenty to thirty minutes  before working out will loosen up your tissues and help with improving the range of motion but do not use heat for the first two weeks following surgery (sometimes heat can increase post-operative swelling).   These exercises can be done on a training (exercise) mat, on the floor, on a table or on a bed. Use whatever works the best and is most comfortable for you.    Use music or television while you are exercising so that the exercises are a pleasant break in your day. This will make your life better with the exercises acting as a break in your routine that you can look forward to.   Perform all exercises about fifteen times, three times per day or as directed.  You should exercise both the operative leg and the other leg as well.   Exercises include:   Quad Sets -  Tighten up the muscle on the front of the thigh (Quad) and hold for 5-10 seconds.   Straight Leg Raises - With your knee straight (if you were given a brace, keep it on), lift the leg to 60 degrees, hold for 3 seconds, and slowly lower the leg.  Perform this exercise against resistance later as your leg gets stronger.  Leg Slides: Lying on your back, slowly slide your foot toward your buttocks, bending your knee up off the floor (only go as far as is comfortable). Then slowly slide your foot back down until your leg is flat on the floor again.  Angel Wings: Lying on your back spread your legs to the side as far apart as you can without causing discomfort.  Hamstring Strength:  Lying on your back, push your heel against the floor with your leg straight by tightening up the muscles of your buttocks.  Repeat, but this time bend your knee to a comfortable angle, and push your heel against the floor.  You may put a pillow under the heel to make it more comfortable if necessary.   A rehabilitation program following joint replacement surgery can speed recovery and prevent re-injury in the future due to weakened muscles. Contact your doctor or a  physical therapist for more information on knee rehabilitation.    CONSTIPATION  Constipation is defined medically as fewer than three stools per week and severe constipation as less than one stool per week.  Even if you have a regular bowel pattern at home, your normal regimen is likely to be disrupted due to multiple reasons following surgery.  Combination of anesthesia, postoperative narcotics, change in appetite and fluid intake all can affect your bowels.   YOU MUST use at least one of the following options; they are listed in order of increasing strength to get the job done.  They are all available over the counter, and you may need to use some, POSSIBLY even all of these options:    Drink plenty of fluids (prune juice may be helpful) and high fiber foods Colace 100 mg by mouth twice a day  Senokot for constipation as directed and as needed Dulcolax (bisacodyl), take with full glass of water  Miralax (polyethylene glycol) once or twice a day as needed.  If you have tried all these things and are unable to have a bowel movement in the first 3-4 days after surgery call either your surgeon or your primary doctor.    If you experience loose stools or diarrhea, hold the medications until you stool forms back up.  If your symptoms do not get better within 1 week or if they get worse, check with your doctor.  If you experience "the worst abdominal pain ever" or develop nausea or vomiting, please contact the office immediately for further recommendations for treatment.   ITCHING:  If you experience itching with your medications, try taking only a single pain pill, or even half a pain pill at a time.  You can also use Benadryl over the counter for itching or also to help with sleep.   TED HOSE STOCKINGS:  Use stockings on both legs until for at least 2 weeks or as directed by physician office. They may be removed at night for sleeping.  MEDICATIONS:  See your medication summary on the "After  Visit Summary" that nursing will review with you.  You may have some home medications which will be placed on hold until you complete the course of blood thinner  medication.  It is important for you to complete the blood thinner medication as prescribed.  PRECAUTIONS:  If you experience chest pain or shortness of breath - call 911 immediately for transfer to the hospital emergency department.   If you develop a fever greater that 101 F, purulent drainage from wound, increased redness or drainage from wound, foul odor from the wound/dressing, or calf pain - CONTACT YOUR SURGEON.                                                   FOLLOW-UP APPOINTMENTS:  If you do not already have a post-op appointment, please call the office for an appointment to be seen by your surgeon.  Guidelines for how soon to be seen are listed in your "After Visit Summary", but are typically between 1-4 weeks after surgery.  OTHER INSTRUCTIONS:   Knee Replacement:  Do not place pillow under knee, focus on keeping the knee straight while resting. CPM instructions: 0-90 degrees, 2 hours in the morning, 2 hours in the afternoon, and 2 hours in the evening. Place foam block, curve side up under heel at all times except when in CPM or when walking.  DO NOT modify, tear, cut, or change the foam block in any way.  MAKE SURE YOU:  Understand these instructions.  Get help right away if you are not doing well or get worse.    Thank you for letting us be a part of your medical care team.  It is a privilege we respect greatly.  We hope these instructions will help you stay on track for a fast and full recovery!   Increase activity slowly as tolerated    Complete by:  As directed      Allergies as of 10/30/2016      Reactions   Hydrocodone-acetaminophen    REACTION: Shortness of Breath   Diovan [valsartan]    Eye redness. No current issues with losartan      Medication List    TAKE these medications   acetaminophen-codeine  300-30 MG tablet Commonly known as:  TYLENOL #3 take 1 tablet by mouth every 4 to 6 hours if needed for pain   aspirin EC 325 MG tablet Take 1 tablet (325 mg total) by mouth 2 (two) times daily.   estradiol 1 MG tablet Commonly known as:  ESTRACE take 1 tablet by mouth once daily   levothyroxine 50 MCG tablet Commonly known as:  SYNTHROID, LEVOTHROID Take 1 tablet (50 mcg total) by mouth daily.   losartan-hydrochlorothiazide 50-12.5 MG tablet Commonly known as:  HYZAAR take 1 tablet by mouth once daily   meloxicam 7.5 MG tablet Commonly known as:  MOBIC Take 7.5 mg by mouth daily.   methocarbamol 500 MG tablet Commonly known as:  ROBAXIN Take 1 tablet (500 mg total) by mouth every 8 (eight) hours as needed for muscle spasms.   omeprazole 40 MG capsule Commonly known as:  PRILOSEC take 1 capsule by mouth once daily   oxyCODONE 5 MG/5ML solution Commonly known as:  ROXICODONE Take 5-10 mLs (5-10 mg total) by mouth every 4 (four) hours as needed for severe pain.   potassium chloride 10 MEQ tablet Commonly known as:  K-DUR,KLOR-CON Take 1 tablet (10 mEq total) by mouth daily. What changed:  when to take this  reasons to take this  triamcinolone 0.025 % ointment Commonly known as:  KENALOG Apply 1 application topically 2 (two) times daily.   Vitamin D (Ergocalciferol) 50000 units Caps capsule Commonly known as:  DRISDOL Take 1 capsule (50,000 Units total) by mouth every 7 (seven) days. For 8 weeks then take twice monthly for maintenance      Follow-up Information    [facility] Follow up.   Specialty:  Home Health Services Why:  Home Health Physical Therapy Contact information: [address] [place] [address] [phone]           Signed: Lajean Manes 11/18/2016, 7:28 AM

## 2017-01-30 ENCOUNTER — Ambulatory Visit: Payer: Self-pay | Admitting: Orthopedic Surgery

## 2017-02-02 ENCOUNTER — Encounter: Payer: Self-pay | Admitting: Family Medicine

## 2017-02-02 ENCOUNTER — Other Ambulatory Visit: Payer: Self-pay

## 2017-02-02 ENCOUNTER — Ambulatory Visit (INDEPENDENT_AMBULATORY_CARE_PROVIDER_SITE_OTHER): Payer: BLUE CROSS/BLUE SHIELD | Admitting: Family Medicine

## 2017-02-02 VITALS — BP 138/66 | HR 68 | Temp 98.2°F | Wt 261.4 lb

## 2017-02-02 DIAGNOSIS — K219 Gastro-esophageal reflux disease without esophagitis: Secondary | ICD-10-CM | POA: Diagnosis not present

## 2017-02-02 DIAGNOSIS — I10 Essential (primary) hypertension: Secondary | ICD-10-CM

## 2017-02-02 DIAGNOSIS — E039 Hypothyroidism, unspecified: Secondary | ICD-10-CM | POA: Diagnosis not present

## 2017-02-02 DIAGNOSIS — L309 Dermatitis, unspecified: Secondary | ICD-10-CM

## 2017-02-02 MED ORDER — TRIAMCINOLONE ACETONIDE 0.025 % EX OINT: 1.0000 "application " | TOPICAL_OINTMENT | Freq: Two times a day (BID) | CUTANEOUS | 3 refills | Status: DC

## 2017-02-02 MED ORDER — LEVOTHYROXINE SODIUM 50 MCG PO TABS: 50.0000 ug | ORAL_TABLET | Freq: Every day | ORAL | 1 refills | Status: DC

## 2017-02-02 MED ORDER — OMEPRAZOLE 40 MG PO CPDR
40.0000 mg | DELAYED_RELEASE_CAPSULE | Freq: Every day | ORAL | 1 refills | Status: DC
Start: 2017-02-02 — End: 2017-08-26

## 2017-02-02 NOTE — Patient Instructions (Signed)
It was a pleasure to see you today. Please follow up in 6 months or sooner if needed.   See food choices below to assist for reflux.   Food Choices for Gastroesophageal Reflux Disease, Adult When you have gastroesophageal reflux disease (GERD), the foods you eat and your eating habits are very important. Choosing the right foods can help ease your discomfort. What guidelines do I need to follow?  Choose fruits, vegetables, whole grains, and low-fat dairy products.  Choose low-fat meat, fish, and poultry.  Limit fats such as oils, salad dressings, butter, nuts, and avocado.  Keep a food diary. This helps you identify foods that cause symptoms.  Avoid foods that cause symptoms. These may be different for everyone.  Eat small meals often instead of 3 large meals a day.  Eat your meals slowly, in a place where you are relaxed.  Limit fried foods.  Cook foods using methods other than frying.  Avoid drinking alcohol.  Avoid drinking large amounts of liquids with your meals.  Avoid bending over or lying down until 2-3 hours after eating. What foods are not recommended? These are some foods and drinks that may make your symptoms worse: Vegetables  Tomatoes. Tomato juice. Tomato and spaghetti sauce. Chili peppers. Onion and garlic. Horseradish. Fruits  Oranges, grapefruit, and lemon (fruit and juice). Meats  High-fat meats, fish, and poultry. This includes hot dogs, ribs, ham, sausage, salami, and bacon. Dairy  Whole milk and chocolate milk. Sour cream. Cream. Butter. Ice cream. Cream cheese. Drinks  Coffee and tea. Bubbly (carbonated) drinks or energy drinks. Condiments  Hot sauce. Barbecue sauce. Sweets/Desserts  Chocolate and cocoa. Donuts. Peppermint and spearmint. Fats and Oils  High-fat foods. This includes [place] fries and potato chips. Other  Vinegar. Strong spices. This includes black pepper, white pepper, red pepper, cayenne, curry powder, cloves, ginger, and  chili powder. The items listed above may not be a complete list of foods and drinks to avoid. Contact your dietitian for more information.  This information is not intended to replace advice given to you by your health care provider. Make sure you discuss any questions you have with your health care provider. Document Released: 04/17/2012 Document Revised: 03/24/2016 Document Reviewed: 08/21/2013 Elsevier Interactive Patient Education  2017 [employer].

## 2017-02-02 NOTE — Progress Notes (Signed)
Subjective:    Patient ID: Gina Ryan, female    DOB: 19-Mar-1956, 61 y.o.   MRN: [id number]  HPI  Gina Ryan is a 61 year old female who is here today to follow up on her chronic medical conditions prior to her knee replacement scheduled in 02/2017.  She reports feeling very well and is looking forward to second knee replacement in May.  HTN:  She monitors her BP sporadically at home but has not done so lately. She reports BP WNL at previous physician visits as she has followed up with orthopedics for recent left knee replacement. She denies chest pain, palpitations, SOB, numbness, tingling, weakness, headaches, or edema. She does not follow a particular diet but does try to limit salt intake  Hypothyroidism:  She is taking her levothyroxine and denies changes in hair, skin, nails, bowels, or intolerance to heat and cold.  GERD:  Controlled with avoidance of food triggers and she reports peanuts are a trigger.  She denies dysphagia, cough, or regurgitation. Reports using prilosec sparingly and notes this as no greater than once/month.  She had a left total knee replacement in 09/2016 which she reports excellent benefit. She reports great benefit from PT. Right knee pain is noted with ambulation that is unchanged. Scheduled for replacement in May.  Lab work 09/2016 was WNL and vitamin D level noted to be improving with supplement. EKG  07/2016 was reviewed with cardiologist which noted NSR with visible p waves in lead 3.  Artifact noted on rhythm strip; suspect that pendulous breast interfered with placement of leads.   Review of Systems  Constitutional: Negative for chills, fatigue and fever.  Respiratory: Negative for cough, shortness of breath and wheezing.   Cardiovascular: Negative for chest pain and palpitations.  Gastrointestinal: Negative for abdominal pain, constipation, diarrhea, nausea and vomiting.  Endocrine: Negative for cold intolerance, heat intolerance, polydipsia,  polyphagia and polyuria.  Musculoskeletal: Negative for myalgias.       Right knee pain; scheduled for total knee replacement  Skin: Negative for rash.  Neurological: Negative for dizziness, weakness, light-headedness, numbness and headaches.  Hematological: Does not bruise/bleed easily.   Past Medical History:  Diagnosis Date  . Atypical chest pain   . GERD (gastroesophageal reflux disease)   . Hypertension   . Morbid obesity ([place])   . Osteoarthritis, knee      Social History   Social History  . Marital status: Single    Spouse name: N/A  . Number of children: N/A  . Years of education: N/A   Occupational History  . [employer]   Social History Main Topics  . Smoking status: Never Smoker  . Smokeless tobacco: Never Used  . Alcohol use No  . Drug use: No  . Sexual activity: Not on file   Other Topics Concern  . Not on file   Social History Narrative   Lives alone   Daugher , son and 4 grand children live in the are      Widowed - husband died 40 yrs ago    Past Surgical History:  Procedure Laterality Date  . CHOLECYSTECTOMY    . COSMETIC SURGERY  07/2010   (lower facelift, lower blepharoplasty)  . GASTRIC BYPASS    . LAPAROSCOPIC GASTRIC BANDING    . ROTATOR CUFF REPAIR Bilateral   . TOTAL ABDOMINAL HYSTERECTOMY    . TOTAL KNEE ARTHROPLASTY Left 10/28/2016   Procedure: LEFT TOTAL KNEE ARTHROPLASTY;  Surgeon: Sydnee Cabal,  MD;  Location: [facility];  Service: Orthopedics;  Laterality: Left;    Family History  Problem Relation Age of Onset  . Stroke Mother   . Hypertension Mother   . Cancer Brother     diagnosed in late 64's    Allergies  Allergen Reactions  . Hydrocodone-Acetaminophen     REACTION: Shortness of Breath  . Diovan [Valsartan]     Eye redness. No current issues with losartan    Current Outpatient Prescriptions on File Prior to Visit  Medication Sig Dispense Refill  . acetaminophen-codeine (TYLENOL #3)  300-30 MG tablet take 1 tablet by mouth every 4 to 6 hours if needed for pain  0  . aspirin EC 325 MG tablet Take 1 tablet (325 mg total) by mouth 2 (two) times daily. 60 tablet 0  . estradiol (ESTRACE) 1 MG tablet take 1 tablet by mouth once daily 30 tablet 5  . levothyroxine (SYNTHROID, LEVOTHROID) 50 MCG tablet Take 1 tablet (50 mcg total) by mouth daily. 90 tablet 1  . losartan-hydrochlorothiazide (HYZAAR) 50-12.5 MG tablet take 1 tablet by mouth once daily 90 tablet 1  . meloxicam (MOBIC) 7.5 MG tablet Take 7.5 mg by mouth daily.     Marland Kitchen omeprazole (PRILOSEC) 40 MG capsule take 1 capsule by mouth once daily 90 capsule 1  . potassium chloride (K-DUR,KLOR-CON) 10 MEQ tablet Take 1 tablet (10 mEq total) by mouth daily. (Patient taking differently: Take 10 mEq by mouth daily as needed (cramps). ) 90 tablet 1  . triamcinolone (KENALOG) 0.025 % ointment Apply 1 application topically 2 (two) times daily. 30 g 0  . Vitamin D, Ergocalciferol, (DRISDOL) 50000 units CAPS capsule Take 1 capsule (50,000 Units total) by mouth every 7 (seven) days. For 8 weeks then take twice monthly for maintenance 30 capsule 0  . methocarbamol (ROBAXIN) 500 MG tablet Take 1 tablet (500 mg total) by mouth every 8 (eight) hours as needed for muscle spasms. (Patient not taking: Reported on 02/02/2017) 50 tablet 2  . oxyCODONE (ROXICODONE) 5 MG/5ML solution Take 5-10 mLs (5-10 mg total) by mouth every 4 (four) hours as needed for severe pain. (Patient not taking: Reported on 02/02/2017) 300 mL 0  . [DISCONTINUED] potassium chloride (K-DUR) 10 MEQ tablet Take 1 tablet (10 mEq total) by mouth daily. 90 tablet 0   No current facility-administered medications on file prior to visit.     BP 138/66 (BP Location: Left Arm, Patient Position: Sitting, Cuff Size: Normal)   Pulse 68   Temp 98.2 F (36.8 C) (Oral)   Wt 261 lb 6.4 oz (118.6 kg)   SpO2 98%   BMI 42.19 kg/m        Objective:   Physical Exam  Constitutional: She is  oriented to person, place, and time.  Morbidly obese female with history of gastric band  HENT:  Right Ear: Tympanic membrane normal.  Left Ear: Tympanic membrane normal.  Nose: No rhinorrhea. Right sinus exhibits no maxillary sinus tenderness and no frontal sinus tenderness. Left sinus exhibits no maxillary sinus tenderness and no frontal sinus tenderness.  Mouth/Throat: Mucous membranes are normal. No oropharyngeal exudate or posterior oropharyngeal erythema.  Neck: Normal range of motion. Neck supple.  Cardiovascular: Normal rate, regular rhythm, normal heart sounds and intact distal pulses.   Pulmonary/Chest: Effort normal and breath sounds normal.  Abdominal: Soft. Bowel sounds are normal. There is no tenderness. There is no rebound.  Musculoskeletal:  No edema noted bilaterally. Healing scar noted on left  leg from recent left total knee replacement  Lymphadenopathy:    She has no cervical adenopathy.  Neurological: She is alert and oriented to person, place, and time.  Skin: Skin is warm and dry.  Psychiatric: She has a normal mood and affect. Her behavior is normal. Judgment and thought content normal.      Assessment & Plan:  1. Essential hypertension Well controlled; continue Hyzaar; advised low salt, low fat diet. Exercise as tolerated and recommended by her orthopedic surgeon who recently completed left total knee replacement  2. Hypothyroidism, unspecified type TSH WNL 09/2016; no symptoms noted; advised recheck in 6 months  3. Gastroesophageal reflux disease, esophagitis presence not specified Controlled with avoidance of food triggers; Prilosec provided today to be used as needed; advised her to follow up for further evaluation if she notices recurring reflux with avoidance of triggers.  Lab work has been ordered by Psychologist, sport and exercise for preop.  Surgical clearance provided for patient today. Advised her to follow up in 6 months or sooner if needed.  Delano Metz, FNP-C

## 2017-02-17 ENCOUNTER — Other Ambulatory Visit: Payer: Self-pay | Admitting: Specialist

## 2017-02-27 NOTE — H&P (Signed)
TOTAL KNEE ADMISSION H&P  Patient is being admitted for right total knee arthroplasty.  Subjective:  Chief Complaint:right knee pain.  HPI: Gina Ryan, 61 y.o. female, has a history of pain and functional disability in the right knee due to arthritis and has failed non-surgical conservative treatments for greater than 12 weeks to includecorticosteriod injections, viscosupplementation injections, use of assistive devices and weight reduction as appropriate.  Onset of symptoms was gradual, starting 3 years ago with gradually worsening course since that time. The patient noted no past surgery on the right knee(s).  Patient currently rates pain in the right knee(s) at 7 out of 10 with activity. Patient has worsening of pain with activity and weight bearing, pain that interferes with activities of daily living, pain with passive range of motion and joint swelling.  Patient has evidence of subchondral sclerosis, periarticular osteophytes and joint space narrowing by imaging studies.There is no active infection.  Patient Active Problem List   Diagnosis Date Noted  . Weight gain 03/03/2014  . Elevated parathyroid hormone 03/03/2014  . Hypothyroidism 03/03/2014  . Obesity 01/19/2011  . NECK PAIN, LEFT 04/07/2010  . S/P knee replacement 12/13/2007  . Essential hypertension 09/05/2007  . GERD 09/05/2007  . Abnormal glucose 09/05/2007  . OSTEOARTHRITIS, KNEE 08/07/2007  . TOTAL HYSTERECTOMY AND BILATERAL SALPINGOOPHERECTOMY, HX OF 08/07/2007  . CHOLECYSTECTOMY, HX OF 08/07/2007   Past Medical History:  Diagnosis Date  . Atypical chest pain   . GERD (gastroesophageal reflux disease)   . Hypertension   . Morbid obesity ([place])   . Osteoarthritis, knee     Past Surgical History:  Procedure Laterality Date  . CHOLECYSTECTOMY    . COSMETIC SURGERY  07/2010   (lower facelift, lower blepharoplasty)  . GASTRIC BYPASS    . LAPAROSCOPIC GASTRIC BANDING    . ROTATOR CUFF REPAIR Bilateral   . TOTAL  ABDOMINAL HYSTERECTOMY    . TOTAL KNEE ARTHROPLASTY Left 10/28/2016   Procedure: LEFT TOTAL KNEE ARTHROPLASTY;  Surgeon: Sydnee Cabal, MD;  Location: [facility];  Service: Orthopedics;  Laterality: Left;    No prescriptions prior to admission.   Allergies  Allergen Reactions  . Hydrocodone-Acetaminophen     REACTION: Shortness of Breath  . Diovan [Valsartan]     Eye redness. No current issues with losartan    Social History  Substance Use Topics  . Smoking status: Never Smoker  . Smokeless tobacco: Never Used  . Alcohol use No    Family History  Problem Relation Age of Onset  . Stroke Mother   . Hypertension Mother   . Cancer Brother     diagnosed in late 78's     Review of Systems  Constitutional: Negative.   HENT: Negative.   Eyes: Negative.   Respiratory: Negative.   Cardiovascular: Negative.   Gastrointestinal: Negative.   Genitourinary: Negative.   Musculoskeletal: Positive for joint pain.  Skin: Negative.   Neurological: Negative.   Endo/Heme/Allergies: Negative.   Psychiatric/Behavioral: Negative.     Objective:  Physical Exam  Constitutional: She is oriented to person, place, and time. She appears well-developed.  HENT:  Head: Normocephalic.  Eyes: EOM are normal.  Neck: Normal range of motion.  Cardiovascular: Normal rate and intact distal pulses.   Respiratory: Effort normal and breath sounds normal.  GI: Soft.  Genitourinary:  Genitourinary Comments: Deferred  Musculoskeletal:  Right knee pain. Good stability of that knee. RLE grossly n/v intact.  Neurological: She is alert and oriented to person, place, and time.  Skin: Skin is warm and dry.  Psychiatric: Her behavior is normal.    Vital signs in last 24 hours: BP: ()/()  Arterial Line BP: ()/()   Labs:   Estimated body mass index is 42.19 kg/m as calculated from the following:   Height as of 10/28/16: 5\' 6"  (1.676 m).   Weight as of 02/02/17: 118.6 kg (261 lb 6.4 oz).   Imaging  Review Plain radiographs demonstrate severe degenerative joint disease of the right knee(s). The overall alignment ismild varus. The bone quality appears to be good for age and reported activity level.  Assessment/Plan:  End stage arthritis, right knee   The patient history, physical examination, clinical judgment of the provider and imaging studies are consistent with end stage degenerative joint disease of the right knee(s) and total knee arthroplasty is deemed medically necessary. The treatment options including medical management, injection therapy arthroscopy and arthroplasty were discussed at length. The risks and benefits of total knee arthroplasty were presented and reviewed. The risks due to aseptic loosening, infection, stiffness, patella tracking problems, thromboembolic complications and other imponderables were discussed. The patient acknowledged the explanation, agreed to proceed with the plan and consent was signed. Patient is being admitted for inpatient treatment for surgery, pain control, PT, OT, prophylactic antibiotics, VTE prophylaxis, progressive ambulation and ADL's and discharge planning. The patient is planning to be discharged home with home health services.  Will use IV tranexamic acid. Contraindications and adverse affects of Tranexamic acid discussed in detail. Patient denies any of these at this time and understands the risks and benefits.

## 2017-03-02 NOTE — Progress Notes (Signed)
02-02-17 Surgical clearance by Delano Metz, FNP on chart 07-07-16 [facility]) EKG on chart

## 2017-03-02 NOTE — Patient Instructions (Addendum)
Gina Ryan  03/02/2017   Your procedure is scheduled on: 03-10-17  Report to [place]  elevators to 3rd floor to [place] at 1 PM.    Call this number if you have problems the morning of surgery [phone]    Remember: ONLY 1 PERSON MAY GO WITH YOU TO SHORT STAY TO GET  READY MORNING OF [place].  Do not eat food or drink liquids :After Midnight. You may have clear liquids from Midnight until 0830 AM. Then nothing by mouth     CLEAR LIQUID DIET   Foods Allowed                                                                     Foods Excluded  Coffee and tea, regular and decaf                             liquids that you cannot  Plain Jell-O in any flavor                                             see through such as: Fruit ices (not with fruit pulp)                                     milk, soups, orange juice  Iced Popsicles                                    All solid food Carbonated beverages, regular and diet                                    Cranberry, grape and apple juices Sports drinks like Gatorade Lightly seasoned clear broth or consume(fat free) Sugar, honey syrup  Sample Menu Breakfast                                Lunch                                     Supper Cranberry juice                    Beef broth                            Chicken broth Jell-O                                     Grape juice  Apple juice Coffee or tea                        Jell-O                                      Popsicle                                                Coffee or tea                        Coffee or tea  _____________________________________________________________________     Take these medicines the morning of surgery with A SIP OF WATER:  Levothyroxine (Synthroid)              You may not have any metal on your body including hair pins and              piercings  Do not  wear jewelry, make-up, lotions, powders or perfumes, deodorant             Do not wear nail polish.  Do not shave  48 hours prior to surgery.                Do not bring valuables to the hospital. [place].  Contacts, dentures or bridgework may not be worn into surgery.  Leave suitcase in the car. After surgery it may be brought to your room.                  Please read over the following fact sheets you were given: _____________________________________________________________________  [facility] - Preparing for Surgery Before surgery, you can play an important role.  Because skin is not sterile, your skin needs to be as free of germs as possible.  You can reduce the number of germs on your skin by washing with CHG (chlorahexidine gluconate) soap before surgery.  CHG is an antiseptic cleaner which kills germs and bonds with the skin to continue killing germs even after washing. Please DO NOT use if you have an allergy to CHG or antibacterial soaps.  If your skin becomes reddened/irritated stop using the CHG and inform your nurse when you arrive at Short Stay. Do not shave (including legs and underarms) for at least 48 hours prior to the first CHG shower.  You may shave your face/neck. Please follow these instructions carefully:  1.  Shower with CHG Soap the night before surgery and the  morning of Surgery.  2.  If you choose to wash your hair, wash your hair first as usual with your  normal  shampoo.  3.  After you shampoo, rinse your hair and body thoroughly to remove the  shampoo.                           4.  Use CHG as you would any other liquid soap.  You can apply chg directly  to the skin and wash  Gently with a scrungie or clean washcloth.  5.  Apply the CHG Soap to your body ONLY FROM THE NECK DOWN.   Do not use on face/ open                           Wound or open sores. Avoid contact with eyes, ears mouth and  genitals (private parts).                       Wash face,  Genitals (private parts) with your normal soap.             6.  Wash thoroughly, paying special attention to the area where your surgery  will be performed.  7.  Thoroughly rinse your body with warm water from the neck down.  8.  DO NOT shower/wash with your normal soap after using and rinsing off  the CHG Soap.                9.  Pat yourself dry with a clean towel.            10.  Wear clean pajamas.            11.  Place clean sheets on your bed the night of your first shower and do not  sleep with pets. Day of Surgery : Do not apply any lotions/deodorants the morning of surgery.  Please wear clean clothes to the hospital/surgery center.  FAILURE TO FOLLOW THESE INSTRUCTIONS MAY RESULT IN THE CANCELLATION OF YOUR SURGERY PATIENT SIGNATURE_________________________________  NURSE SIGNATURE__________________________________  ________________________________________________________________________   Adam Phenix  An incentive spirometer is a tool that can help keep your lungs clear and active. This tool measures how well you are filling your lungs with each breath. Taking long deep breaths may help reverse or decrease the chance of developing breathing (pulmonary) problems (especially infection) following:  A long period of time when you are unable to move or be active. BEFORE THE PROCEDURE   If the spirometer includes an indicator to show your best effort, your nurse or respiratory therapist will set it to a desired goal.  If possible, sit up straight or lean slightly forward. Try not to slouch.  Hold the incentive spirometer in an upright position. INSTRUCTIONS FOR USE  1. Sit on the edge of your bed if possible, or sit up as far as you can in bed or on a chair. 2. Hold the incentive spirometer in an upright position. 3. Breathe out normally. 4. Place the mouthpiece in your mouth and seal your lips tightly around  it. 5. Breathe in slowly and as deeply as possible, raising the piston or the ball toward the top of the column. 6. Hold your breath for 3-5 seconds or for as long as possible. Allow the piston or ball to fall to the bottom of the column. 7. Remove the mouthpiece from your mouth and breathe out normally. 8. Rest for a few seconds and repeat Steps 1 through 7 at least 10 times every 1-2 hours when you are awake. Take your time and take a few normal breaths between deep breaths. 9. The spirometer may include an indicator to show your best effort. Use the indicator as a goal to work toward during each repetition. 10. After each set of 10 deep breaths, practice coughing to be sure your lungs are clear. If you have an incision (the cut made at the time of  surgery), support your incision when coughing by placing a pillow or rolled up towels firmly against it. Once you are able to get out of bed, walk around indoors and cough well. You may stop using the incentive spirometer when instructed by your caregiver.  RISKS AND COMPLICATIONS  Take your time so you do not get dizzy or light-headed.  If you are in pain, you may need to take or ask for pain medication before doing incentive spirometry. It is harder to take a deep breath if you are having pain. AFTER USE  Rest and breathe slowly and easily.  It can be helpful to keep track of a log of your progress. Your caregiver can provide you with a simple table to help with this. If you are using the spirometer at home, follow these instructions: [place] IF:   You are having difficultly using the spirometer.  You have trouble using the spirometer as often as instructed.  Your pain medication is not giving enough relief while using the spirometer.  You develop fever of 100.5 F (38.1 C) or higher. SEEK IMMEDIATE MEDICAL CARE IF:   You cough up bloody sputum that had not been present before.  You develop fever of 102 F (38.9 C) or  greater.  You develop worsening pain at or near the incision site. MAKE SURE YOU:   Understand these instructions.  Will watch your condition.  Will get help right away if you are not doing well or get worse. Document Released: 02/27/2007 Document Revised: 01/09/2012 Document Reviewed: 04/30/2007 [facility] Patient Information 2014 [place], [place].   ________________________________________________________________________

## 2017-03-03 ENCOUNTER — Encounter (HOSPITAL_COMMUNITY): Payer: Self-pay

## 2017-03-03 ENCOUNTER — Encounter (HOSPITAL_COMMUNITY)
Admission: RE | Admit: 2017-03-03 | Discharge: 2017-03-03 | Disposition: A | Discharge status: Home / Self Care | Payer: BLUE CROSS/BLUE SHIELD | Source: Ambulatory Visit | Attending: Specialist | Admitting: Specialist

## 2017-03-03 DIAGNOSIS — M1711 Unilateral primary osteoarthritis, right knee: Secondary | ICD-10-CM | POA: Diagnosis not present

## 2017-03-03 DIAGNOSIS — Z01812 Encounter for preprocedural laboratory examination: Secondary | ICD-10-CM | POA: Insufficient documentation

## 2017-03-03 LAB — CBC
HEMATOCRIT: 37.1 % (ref 36.0–46.0)
HEMOGLOBIN: 11.7 g/dL — AB (ref 12.0–15.0)
MCH: 26.1 pg (ref 26.0–34.0)
MCHC: 31.5 g/dL (ref 30.0–36.0)
MCV: 82.8 fL (ref 78.0–100.0)
Platelets: 284 10*3/uL (ref 150–400)
RBC: 4.48 MIL/uL (ref 3.87–5.11)
RDW: 14.4 % (ref 11.5–15.5)
WBC: 7.1 10*3/uL (ref 4.0–10.5)

## 2017-03-03 LAB — PROTIME-INR
INR: 0.95
Prothrombin Time: 12.7 seconds (ref 11.4–15.2)

## 2017-03-03 LAB — BASIC METABOLIC PANEL
ANION GAP: 5 (ref 5–15)
BUN: 18 mg/dL (ref 6–20)
CO2: 29 mmol/L (ref 22–32)
Calcium: 8.8 mg/dL — ABNORMAL LOW (ref 8.9–10.3)
Chloride: 106 mmol/L (ref 101–111)
Creatinine, Ser: 0.79 mg/dL (ref 0.44–1.00)
GFR calc Af Amer: >60 mL/min (ref >60)
GLUCOSE: 101 mg/dL — AB (ref 65–99)
POTASSIUM: 4.6 mmol/L (ref 3.5–5.1)
Sodium: 140 mmol/L (ref 135–145)

## 2017-03-03 LAB — SURGICAL PCR SCREEN
MRSA, PCR: NEGATIVE
Staphylococcus aureus: NEGATIVE

## 2017-03-03 LAB — APTT: APTT: 36 s (ref 24–36)

## 2017-03-04 ENCOUNTER — Other Ambulatory Visit: Payer: Self-pay | Admitting: Family Medicine

## 2017-03-04 DIAGNOSIS — I10 Essential (primary) hypertension: Secondary | ICD-10-CM

## 2017-03-07 ENCOUNTER — Encounter (HOSPITAL_COMMUNITY)
Admission: RE | Admit: 2017-03-07 | Discharge: 2017-03-07 | Disposition: A | Discharge status: Home / Self Care | Payer: BLUE CROSS/BLUE SHIELD | Source: Ambulatory Visit | Attending: Specialist | Admitting: Specialist

## 2017-03-07 LAB — URINALYSIS, ROUTINE W REFLEX MICROSCOPIC
Bilirubin Urine: NEGATIVE
GLUCOSE, UA: NEGATIVE mg/dL
HGB URINE DIPSTICK: NEGATIVE
KETONES UR: NEGATIVE mg/dL
Leukocytes, UA: NEGATIVE
Nitrite: NEGATIVE
PROTEIN: NEGATIVE mg/dL
Specific Gravity, Urine: 1.017 (ref 1.005–1.030)
pH: 6 (ref 5.0–8.0)

## 2017-03-10 ENCOUNTER — Inpatient Hospital Stay (HOSPITAL_COMMUNITY): Payer: BLUE CROSS/BLUE SHIELD | Admitting: Anesthesiology

## 2017-03-10 ENCOUNTER — Encounter (HOSPITAL_COMMUNITY): Payer: Self-pay | Admitting: *Deleted

## 2017-03-10 ENCOUNTER — Encounter (HOSPITAL_COMMUNITY)
Admission: RE | Disposition: A | Discharge status: Home Health | Payer: Self-pay | Source: Ambulatory Visit | Attending: Specialist

## 2017-03-10 ENCOUNTER — Inpatient Hospital Stay (HOSPITAL_COMMUNITY)
Admission: RE | Admit: 2017-03-10 | Discharge: 2017-03-12 | DRG: 470 | Disposition: A | Discharge status: Home Health | Payer: BLUE CROSS/BLUE SHIELD | Source: Ambulatory Visit | Attending: Specialist | Admitting: Specialist

## 2017-03-10 DIAGNOSIS — M1711 Unilateral primary osteoarthritis, right knee: Secondary | ICD-10-CM | POA: Diagnosis present

## 2017-03-10 DIAGNOSIS — I10 Essential (primary) hypertension: Secondary | ICD-10-CM | POA: Diagnosis present

## 2017-03-10 DIAGNOSIS — Z96652 Presence of left artificial knee joint: Secondary | ICD-10-CM | POA: Diagnosis present

## 2017-03-10 DIAGNOSIS — E039 Hypothyroidism, unspecified: Secondary | ICD-10-CM | POA: Diagnosis present

## 2017-03-10 DIAGNOSIS — Z96659 Presence of unspecified artificial knee joint: Secondary | ICD-10-CM

## 2017-03-10 DIAGNOSIS — K219 Gastro-esophageal reflux disease without esophagitis: Secondary | ICD-10-CM | POA: Diagnosis present

## 2017-03-10 DIAGNOSIS — M25561 Pain in right knee: Secondary | ICD-10-CM | POA: Diagnosis present

## 2017-03-10 DIAGNOSIS — Z9884 Bariatric surgery status: Secondary | ICD-10-CM | POA: Diagnosis not present

## 2017-03-10 DIAGNOSIS — Z6841 Body Mass Index (BMI) 40.0 and over, adult: Secondary | ICD-10-CM | POA: Diagnosis not present

## 2017-03-10 DIAGNOSIS — M25761 Osteophyte, right knee: Secondary | ICD-10-CM | POA: Diagnosis present

## 2017-03-10 DIAGNOSIS — Z8249 Family history of ischemic heart disease and other diseases of the circulatory system: Secondary | ICD-10-CM

## 2017-03-10 DIAGNOSIS — Z885 Allergy status to narcotic agent status: Secondary | ICD-10-CM

## 2017-03-10 DIAGNOSIS — Z90710 Acquired absence of both cervix and uterus: Secondary | ICD-10-CM

## 2017-03-10 DIAGNOSIS — Z888 Allergy status to other drugs, medicaments and biological substances status: Secondary | ICD-10-CM | POA: Diagnosis not present

## 2017-03-10 HISTORY — PX: TOTAL KNEE ARTHROPLASTY: SHX125

## 2017-03-10 LAB — TYPE AND SCREEN
ABO/RH(D): O POS
Antibody Screen: POSITIVE
DAT, IgG: NEGATIVE

## 2017-03-10 SURGERY — ARTHROPLASTY, KNEE, TOTAL
Anesthesia: Regional | Site: Knee | Laterality: Right

## 2017-03-10 MED ORDER — KETOROLAC TROMETHAMINE 30 MG/ML IJ SOLN
INTRAMUSCULAR | Status: DC | PRN
  Administered 2017-03-10: 30 mg

## 2017-03-10 MED ORDER — FENTANYL CITRATE (PF) 100 MCG/2ML IJ SOLN
INTRAMUSCULAR | Status: DC | PRN
  Administered 2017-03-10: 100 ug via INTRAVENOUS

## 2017-03-10 MED ORDER — LACTATED RINGERS IV SOLN
INTRAVENOUS | Status: DC
  Administered 2017-03-10 (×3): via INTRAVENOUS

## 2017-03-10 MED ORDER — MENTHOL 3 MG MT LOZG: 1.0000 | LOZENGE | OROMUCOSAL | Status: DC | PRN

## 2017-03-10 MED ORDER — HYDROMORPHONE HCL 1 MG/ML IJ SOLN: 0.2500 mg | INTRAMUSCULAR | Status: DC | PRN

## 2017-03-10 MED ORDER — POLYETHYLENE GLYCOL 3350 17 G PO PACK: 17.0000 g | PACK | Freq: Every day | ORAL | Status: DC | PRN

## 2017-03-10 MED ORDER — DEXAMETHASONE SODIUM PHOSPHATE 10 MG/ML IJ SOLN
10.0000 mg | Freq: Once | INTRAMUSCULAR | Status: AC
  Administered 2017-03-10: 10 mg via INTRAVENOUS

## 2017-03-10 MED ORDER — FENTANYL CITRATE (PF) 100 MCG/2ML IJ SOLN
INTRAMUSCULAR | Status: AC
  Filled 2017-03-10: qty 2

## 2017-03-10 MED ORDER — PROPOFOL 10 MG/ML IV BOLUS
INTRAVENOUS | Status: AC
  Filled 2017-03-10: qty 20

## 2017-03-10 MED ORDER — METHOCARBAMOL 1000 MG/10ML IJ SOLN
500.0000 mg | Freq: Four times a day (QID) | INTRAVENOUS | Status: DC | PRN
  Administered 2017-03-10: 500 mg via INTRAVENOUS
  Filled 2017-03-10: qty 550

## 2017-03-10 MED ORDER — BISACODYL 5 MG PO TBEC: 5.0000 mg | DELAYED_RELEASE_TABLET | Freq: Every day | ORAL | Status: DC | PRN

## 2017-03-10 MED ORDER — DIPHENHYDRAMINE HCL 12.5 MG/5ML PO ELIX: 12.5000 mg | ORAL_SOLUTION | ORAL | Status: DC | PRN

## 2017-03-10 MED ORDER — METOCLOPRAMIDE HCL 5 MG PO TABS: 5.0000 mg | ORAL_TABLET | Freq: Three times a day (TID) | ORAL | Status: DC | PRN

## 2017-03-10 MED ORDER — HYDROCHLOROTHIAZIDE 12.5 MG PO CAPS
12.5000 mg | ORAL_CAPSULE | Freq: Every day | ORAL | Status: DC
  Administered 2017-03-10 – 2017-03-12 (×3): 12.5 mg via ORAL
  Filled 2017-03-10 (×3): qty 1

## 2017-03-10 MED ORDER — FERROUS SULFATE 325 (65 FE) MG PO TABS
325.0000 mg | ORAL_TABLET | Freq: Three times a day (TID) | ORAL | Status: DC
  Administered 2017-03-11 – 2017-03-12 (×4): 325 mg via ORAL
  Filled 2017-03-10 (×4): qty 1

## 2017-03-10 MED ORDER — PROPOFOL 500 MG/50ML IV EMUL
INTRAVENOUS | Status: DC | PRN
  Administered 2017-03-10: 50 ug/kg/min via INTRAVENOUS

## 2017-03-10 MED ORDER — MIDAZOLAM HCL 2 MG/2ML IJ SOLN
INTRAMUSCULAR | Status: AC
  Administered 2017-03-10: 1 mg
  Filled 2017-03-10: qty 2

## 2017-03-10 MED ORDER — LOSARTAN POTASSIUM-HCTZ 50-12.5 MG PO TABS: 1.0000 | ORAL_TABLET | Freq: Every day | ORAL | Status: DC

## 2017-03-10 MED ORDER — PROMETHAZINE HCL 25 MG/ML IJ SOLN: 6.2500 mg | INTRAMUSCULAR | Status: DC | PRN

## 2017-03-10 MED ORDER — ACETAMINOPHEN 325 MG PO TABS: 650.0000 mg | ORAL_TABLET | Freq: Four times a day (QID) | ORAL | Status: DC | PRN

## 2017-03-10 MED ORDER — MIDAZOLAM HCL 5 MG/ML IJ SOLN
2.0000 mg | Freq: Once | INTRAMUSCULAR | Status: AC
  Administered 2017-03-10 (×2): 1 mg via INTRAVENOUS

## 2017-03-10 MED ORDER — OXYCODONE HCL 5 MG/5ML PO SOLN
5.0000 mg | ORAL | Status: DC | PRN
  Administered 2017-03-10 – 2017-03-11 (×2): 5 mg via ORAL
  Administered 2017-03-11: 10 mg via ORAL
  Administered 2017-03-11 (×3): 5 mg via ORAL
  Administered 2017-03-12 (×2): 10 mg via ORAL
  Filled 2017-03-10: qty 5
  Filled 2017-03-10 (×4): qty 10
  Filled 2017-03-10 (×2): qty 5
  Filled 2017-03-10: qty 10

## 2017-03-10 MED ORDER — METOCLOPRAMIDE HCL 5 MG/ML IJ SOLN: 5.0000 mg | Freq: Three times a day (TID) | INTRAMUSCULAR | Status: DC | PRN

## 2017-03-10 MED ORDER — PROPOFOL 10 MG/ML IV BOLUS
INTRAVENOUS | Status: AC
  Filled 2017-03-10: qty 60

## 2017-03-10 MED ORDER — FENTANYL CITRATE (PF) 100 MCG/2ML IJ SOLN
100.0000 ug | Freq: Once | INTRAMUSCULAR | Status: AC
Start: 2017-03-10 — End: 2017-03-10
  Administered 2017-03-10 (×2): 50 ug via INTRAVENOUS

## 2017-03-10 MED ORDER — FLEET ENEMA 7-19 GM/118ML RE ENEM: 1.0000 | ENEMA | Freq: Once | RECTAL | Status: DC | PRN

## 2017-03-10 MED ORDER — ACETAMINOPHEN 650 MG RE SUPP: 650.0000 mg | Freq: Four times a day (QID) | RECTAL | Status: DC | PRN

## 2017-03-10 MED ORDER — ALUM & MAG HYDROXIDE-SIMETH 200-200-20 MG/5ML PO SUSP: 30.0000 mL | ORAL | Status: DC | PRN

## 2017-03-10 MED ORDER — KETOROLAC TROMETHAMINE 30 MG/ML IJ SOLN
INTRAMUSCULAR | Status: AC
  Filled 2017-03-10: qty 1

## 2017-03-10 MED ORDER — ENOXAPARIN SODIUM 30 MG/0.3ML ~~LOC~~ SOLN
30.0000 mg | Freq: Two times a day (BID) | SUBCUTANEOUS | Status: DC
  Administered 2017-03-11 – 2017-03-12 (×3): 30 mg via SUBCUTANEOUS
  Filled 2017-03-10 (×3): qty 0.3

## 2017-03-10 MED ORDER — ONDANSETRON HCL 4 MG/2ML IJ SOLN: 4.0000 mg | Freq: Four times a day (QID) | INTRAMUSCULAR | Status: DC | PRN

## 2017-03-10 MED ORDER — ONDANSETRON HCL 4 MG PO TABS: 4.0000 mg | ORAL_TABLET | Freq: Four times a day (QID) | ORAL | Status: DC | PRN

## 2017-03-10 MED ORDER — OXYCODONE HCL 5 MG/5ML PO SOLN: 5.0000 mg | ORAL | 0 refills | Status: DC | PRN

## 2017-03-10 MED ORDER — SODIUM CHLORIDE 0.9 % IJ SOLN
INTRAMUSCULAR | Status: AC
  Filled 2017-03-10: qty 50

## 2017-03-10 MED ORDER — PHENOL 1.4 % MT LIQD: 1.0000 | OROMUCOSAL | Status: DC | PRN

## 2017-03-10 MED ORDER — METHOCARBAMOL 500 MG PO TABS: 500.0000 mg | ORAL_TABLET | Freq: Three times a day (TID) | ORAL | 2 refills | Status: DC | PRN

## 2017-03-10 MED ORDER — LEVOTHYROXINE SODIUM 50 MCG PO TABS
50.0000 ug | ORAL_TABLET | Freq: Every day | ORAL | Status: DC
  Administered 2017-03-11 – 2017-03-12 (×2): 50 ug via ORAL
  Filled 2017-03-10 (×2): qty 1

## 2017-03-10 MED ORDER — SODIUM CHLORIDE 0.9 % IJ SOLN
INTRAMUSCULAR | Status: DC | PRN
  Administered 2017-03-10: 20 mL

## 2017-03-10 MED ORDER — TRANEXAMIC ACID 1000 MG/10ML IV SOLN
1000.0000 mg | INTRAVENOUS | Status: AC
  Administered 2017-03-10: 1000 mg via INTRAVENOUS
  Filled 2017-03-10: qty 10
  Filled 2017-03-10: qty 1100

## 2017-03-10 MED ORDER — DOCUSATE SODIUM 100 MG PO CAPS
100.0000 mg | ORAL_CAPSULE | Freq: Two times a day (BID) | ORAL | Status: DC
  Administered 2017-03-10 – 2017-03-12 (×4): 100 mg via ORAL
  Filled 2017-03-10 (×4): qty 1

## 2017-03-10 MED ORDER — BUPIVACAINE HCL (PF) 0.25 % IJ SOLN
INTRAMUSCULAR | Status: DC | PRN
  Administered 2017-03-10: 30 mL

## 2017-03-10 MED ORDER — SODIUM CHLORIDE 0.9 % IV SOLN
INTRAVENOUS | Status: DC
  Administered 2017-03-10: 20:00:00 via INTRAVENOUS

## 2017-03-10 MED ORDER — ROPIVACAINE HCL 5 MG/ML IJ SOLN
INTRAMUSCULAR | Status: DC | PRN
  Administered 2017-03-10: 30 mL via PERINEURAL

## 2017-03-10 MED ORDER — PANTOPRAZOLE SODIUM 40 MG PO TBEC
40.0000 mg | DELAYED_RELEASE_TABLET | Freq: Every day | ORAL | Status: DC
  Administered 2017-03-10 – 2017-03-11 (×2): 40 mg via ORAL
  Filled 2017-03-10 (×2): qty 1

## 2017-03-10 MED ORDER — CEFAZOLIN SODIUM-DEXTROSE 2-4 GM/100ML-% IV SOLN
2.0000 g | Freq: Four times a day (QID) | INTRAVENOUS | Status: AC
  Administered 2017-03-10 – 2017-03-11 (×2): 2 g via INTRAVENOUS
  Filled 2017-03-10 (×2): qty 100

## 2017-03-10 MED ORDER — BUPIVACAINE HCL (PF) 0.25 % IJ SOLN
INTRAMUSCULAR | Status: AC
  Filled 2017-03-10: qty 30

## 2017-03-10 MED ORDER — HYDROMORPHONE HCL 1 MG/ML IJ SOLN
1.0000 mg | INTRAMUSCULAR | Status: DC | PRN
  Administered 2017-03-10 – 2017-03-11 (×3): 1 mg via INTRAVENOUS
  Filled 2017-03-10 (×4): qty 1

## 2017-03-10 MED ORDER — LACTATED RINGERS IR SOLN
Status: DC | PRN
  Administered 2017-03-10: 1000 mL

## 2017-03-10 MED ORDER — ASPIRIN EC 325 MG PO TBEC: 325.0000 mg | DELAYED_RELEASE_TABLET | Freq: Two times a day (BID) | ORAL | 0 refills | Status: DC

## 2017-03-10 MED ORDER — FENTANYL CITRATE (PF) 100 MCG/2ML IJ SOLN
INTRAMUSCULAR | Status: AC
  Administered 2017-03-10: 50 ug via INTRAVENOUS
  Filled 2017-03-10: qty 2

## 2017-03-10 MED ORDER — METHOCARBAMOL 500 MG PO TABS
500.0000 mg | ORAL_TABLET | Freq: Four times a day (QID) | ORAL | Status: DC | PRN
  Administered 2017-03-11 (×2): 500 mg via ORAL
  Filled 2017-03-10 (×3): qty 1

## 2017-03-10 MED ORDER — CEFAZOLIN SODIUM-DEXTROSE 2-4 GM/100ML-% IV SOLN
2.0000 g | INTRAVENOUS | Status: AC
  Administered 2017-03-10: 2 g via INTRAVENOUS
  Filled 2017-03-10: qty 100

## 2017-03-10 MED ORDER — CHLORHEXIDINE GLUCONATE 4 % EX LIQD: 60.0000 mL | Freq: Once | CUTANEOUS | Status: DC

## 2017-03-10 MED ORDER — POTASSIUM CHLORIDE CRYS ER 10 MEQ PO TBCR: 10.0000 meq | EXTENDED_RELEASE_TABLET | Freq: Every day | ORAL | Status: DC | PRN

## 2017-03-10 MED ORDER — LOSARTAN POTASSIUM 50 MG PO TABS
50.0000 mg | ORAL_TABLET | Freq: Every day | ORAL | Status: DC
  Administered 2017-03-10 – 2017-03-12 (×3): 50 mg via ORAL
  Filled 2017-03-10 (×3): qty 1

## 2017-03-10 SURGICAL SUPPLY — 61 items
ADH SKN CLS APL DERMABOND .7 (GAUZE/BANDAGES/DRESSINGS) ×1
BAG DECANTER FOR FLEXI CONT (MISCELLANEOUS) IMPLANT
BAG SPEC THK2 15X12 ZIP CLS (MISCELLANEOUS) ×2
BAG ZIPLOCK 12X15 (MISCELLANEOUS) ×4 IMPLANT
BANDAGE ACE 4X5 VEL STRL LF (GAUZE/BANDAGES/DRESSINGS) ×2 IMPLANT
BANDAGE ACE 6X5 VEL STRL LF (GAUZE/BANDAGES/DRESSINGS) ×2 IMPLANT
BLADE SAG 18X100X1.27 (BLADE) ×2 IMPLANT
BLADE SAW SGTL 13.0X1.19X90.0M (BLADE) ×2 IMPLANT
BOWL SMART MIX CTS (DISPOSABLE) ×2 IMPLANT
CAP KNEE TOTAL 3 SIGMA ×1 IMPLANT
CEMENT HV SMART SET (Cement) ×2 IMPLANT
COVER SURGICAL LIGHT HANDLE (MISCELLANEOUS) ×2 IMPLANT
CUFF TOURN SGL QUICK 34 (TOURNIQUET CUFF) ×2
CUFF TRNQT CYL 34X4X40X1 (TOURNIQUET CUFF) ×1 IMPLANT
DECANTER SPIKE VIAL GLASS SM (MISCELLANEOUS) ×2 IMPLANT
DERMABOND ADVANCED (GAUZE/BANDAGES/DRESSINGS) ×1
DERMABOND ADVANCED .7 DNX12 (GAUZE/BANDAGES/DRESSINGS) ×1 IMPLANT
DRAPE U-SHAPE 47X51 STRL (DRAPES) ×2 IMPLANT
DRSG AQUACEL AG ADV 3.5X10 (GAUZE/BANDAGES/DRESSINGS) ×2 IMPLANT
DRSG TEGADERM 4X4.75 (GAUZE/BANDAGES/DRESSINGS) ×2 IMPLANT
DURAPREP 26ML APPLICATOR (WOUND CARE) ×4 IMPLANT
ELECT REM PT RETURN 15FT ADLT (MISCELLANEOUS) ×2 IMPLANT
EVACUATOR 1/8 PVC DRAIN (DRAIN) ×2 IMPLANT
FACESHIELD WRAPAROUND (MASK) ×2 IMPLANT
FACESHIELD WRAPAROUND OR TEAM (MASK) IMPLANT
GAUZE SPONGE 2X2 8PLY STRL LF (GAUZE/BANDAGES/DRESSINGS) ×1 IMPLANT
GLOVE BIOGEL PI IND STRL 8 (GLOVE) ×2 IMPLANT
GLOVE BIOGEL PI INDICATOR 8 (GLOVE) ×2
GLOVE ECLIPSE 8.0 STRL XLNG CF (GLOVE) ×4 IMPLANT
GLOVE SURG ORTHO 9.0 STRL STRW (GLOVE) ×2 IMPLANT
GLOVE SURG SS PI 7.5 STRL IVOR (GLOVE) ×2 IMPLANT
GOWN STRL REUS W/TWL XL LVL3 (GOWN DISPOSABLE) ×4 IMPLANT
HANDPIECE INTERPULSE COAX TIP (DISPOSABLE) ×2
IMMOBILIZER KNEE 20 (SOFTGOODS) ×2
IMMOBILIZER KNEE 20 THIGH 36 (SOFTGOODS) ×1 IMPLANT
LIQUID BAND (GAUZE/BANDAGES/DRESSINGS) ×1 IMPLANT
NS IRRIG 1000ML POUR BTL (IV SOLUTION) ×2 IMPLANT
PACK TOTAL KNEE CUSTOM (KITS) ×2 IMPLANT
POSITIONER SURGICAL ARM (MISCELLANEOUS) ×2 IMPLANT
SET HNDPC FAN SPRY TIP SCT (DISPOSABLE) ×1 IMPLANT
SET PAD KNEE POSITIONER (MISCELLANEOUS) ×2 IMPLANT
SPONGE GAUZE 2X2 STER 10/PKG (GAUZE/BANDAGES/DRESSINGS) ×1
SPONGE LAP 18X18 X RAY DECT (DISPOSABLE) IMPLANT
SPONGE SURGIFOAM ABS GEL 100 (HEMOSTASIS) ×2 IMPLANT
STOCKINETTE 6  STRL (DRAPES) ×1
STOCKINETTE 6 STRL (DRAPES) ×1 IMPLANT
SUCTION FRAZIER HANDLE 12FR (TUBING) ×1
SUCTION TUBE FRAZIER 12FR DISP (TUBING) ×1 IMPLANT
SUT BONE WAX W31G (SUTURE) IMPLANT
SUT MNCRL AB 3-0 PS2 18 (SUTURE) ×2 IMPLANT
SUT VIC AB 1 CT1 27 (SUTURE) ×8
SUT VIC AB 1 CT1 27XBRD ANTBC (SUTURE) ×4 IMPLANT
SUT VIC AB 2-0 CT1 27 (SUTURE) ×4
SUT VIC AB 2-0 CT1 TAPERPNT 27 (SUTURE) ×2 IMPLANT
SUT VLOC 180 0 24IN GS25 (SUTURE) ×2 IMPLANT
SYR 50ML LL SCALE MARK (SYRINGE) ×2 IMPLANT
TAPE STRIPS DRAPE STRL (GAUZE/BANDAGES/DRESSINGS) ×2 IMPLANT
TRAY FOLEY W/METER SILVER 16FR (SET/KITS/TRAYS/PACK) ×2 IMPLANT
WATER STERILE IRR 1500ML POUR (IV SOLUTION) ×4 IMPLANT
WRAP KNEE MAXI GEL POST OP (GAUZE/BANDAGES/DRESSINGS) ×2 IMPLANT
YANKAUER SUCT BULB TIP 10FT TU (MISCELLANEOUS) ×2 IMPLANT

## 2017-03-10 NOTE — Anesthesia Procedure Notes (Signed)
Spinal  Staffing Anesthesiologist: Suzette Battiest Performed: anesthesiologist  Preanesthetic Checklist Completed: patient identified, site marked, surgical consent, pre-op evaluation, timeout performed, IV checked, risks and benefits discussed and monitors and equipment checked Spinal Block Patient position: sitting Prep: site prepped and draped and DuraPrep Patient monitoring: heart rate, continuous pulse ox and blood pressure Approach: midline Location: L5-S1 Injection technique: single-shot Needle Needle type: Pencan and Sprotte  Needle gauge: 24 G Needle length: 12.7 cm Needle insertion depth: 10 cm

## 2017-03-10 NOTE — Anesthesia Procedure Notes (Signed)
Anesthesia Regional Block: Adductor canal block   Pre-Anesthetic Checklist: ,, timeout performed, Correct Patient, Correct Site, Correct Laterality, Correct Procedure, Correct Position, site marked, Risks and benefits discussed,  Surgical consent,  Pre-op evaluation,  At surgeon's request and post-op pain management  Laterality: Right  Prep: chloraprep       Needles:  Injection technique: Single-shot  Needle Type: Echogenic Needle     Needle Length: 9cm  Needle Gauge: 21     Additional Needles:   Procedures: ultrasound guided,,,,,,,,  Narrative:  Start time: 03/10/2017 2:55 PM End time: 03/10/2017 3:03 PM Injection made incrementally with aspirations every 5 mL.  Performed by: Personally  Anesthesiologist: Suzette Battiest

## 2017-03-10 NOTE — Anesthesia Preprocedure Evaluation (Addendum)
Anesthesia Evaluation  Patient identified by MRN, date of birth, ID band Patient awake    Reviewed: Allergy & Precautions, NPO status , Patient's Chart, lab work & pertinent test results  Airway Mallampati: II  TM Distance: >3 FB Neck ROM: Full    Dental no notable dental hx.    Pulmonary neg pulmonary ROS,    Pulmonary exam normal breath sounds clear to auscultation       Cardiovascular hypertension, Pt. on medications Normal cardiovascular exam Rhythm:Regular Rate:Normal     Neuro/Psych negative neurological ROS  negative psych ROS   GI/Hepatic Neg liver ROS, GERD  ,  Endo/Other  Hypothyroidism Morbid obesity  Renal/GU negative Renal ROS     Musculoskeletal  (+) Arthritis ,   Abdominal (+) + obese,   Peds  Hematology negative hematology ROS (+)   Anesthesia Other Findings   Reproductive/Obstetrics negative OB ROS                             Lab Results  Component Value Date   WBC 7.1 03/03/2017   HGB 11.7 (L) 03/03/2017   HCT 37.1 03/03/2017   MCV 82.8 03/03/2017   PLT 284 03/03/2017   Lab Results  Component Value Date   CREATININE 0.79 03/03/2017   BUN 18 03/03/2017   NA 140 03/03/2017   K 4.6 03/03/2017   CL 106 03/03/2017   CO2 29 03/03/2017   Lab Results  Component Value Date   INR 0.95 03/03/2017   INR 0.96 10/21/2016    Anesthesia Physical  Anesthesia Plan  ASA: III  Anesthesia Plan: Regional and Spinal   Post-op Pain Management:  Regional for Post-op pain   Induction:   Airway Management Planned: Natural Airway and Simple Face Mask  Additional Equipment:   Intra-op Plan:   Post-operative Plan:   Informed Consent: I have reviewed the patients History and Physical, chart, labs and discussed the procedure including the risks, benefits and alternatives for the proposed anesthesia with the patient or authorized representative who has indicated his/her  understanding and acceptance.   Dental advisory given  Plan Discussed with: CRNA  Anesthesia Plan Comments:         Anesthesia Quick Evaluation

## 2017-03-10 NOTE — Transfer of Care (Signed)
Immediate Anesthesia Transfer of Care Note  Patient: Gina Ryan  Procedure(s) Performed: Procedure(s): RIGHT TOTAL KNEE ARTHROPLASTY (Right)  Patient Location: PACU  Anesthesia Type:Spinal  Level of Consciousness: awake, alert  and oriented  Airway & Oxygen Therapy: Patient Spontanous Breathing and Patient connected to face mask oxygen  Post-op Assessment: Report given to RN and Post -op Vital signs reviewed and stable  Post vital signs: Reviewed and stable  Last Vitals:  Vitals:   03/10/17 1503 03/10/17 1504  BP: (!) 147/68   Pulse: (!) 53 (!) 48  Resp: 11 (!) 9  Temp:      Last Pain:  Vitals:   03/10/17 1330  TempSrc:   PainSc: 4       Patients Stated Pain Goal: 4 (49/75/30 0511)  Complications: No apparent anesthesia complications

## 2017-03-10 NOTE — Interval H&P Note (Signed)
History and Physical Interval Note:  03/10/2017 2:38 PM  Gina Ryan  has presented today for surgery, with the diagnosis of Right knee osteoarthritis  The various methods of treatment have been discussed with the patient and family. After consideration of risks, benefits and other options for treatment, the patient has consented to  Procedure(s): RIGHT TOTAL KNEE ARTHROPLASTY (Right) as a surgical intervention .  The patient's history has been reviewed, patient examined, no change in status, stable for surgery.  I have reviewed the patient's chart and labs.  Questions were answered to the patient's satisfaction.     [name] ANDREW

## 2017-03-10 NOTE — Progress Notes (Signed)
AssistedDr. Rob Fitzgerald with right, ultrasound guided, adductor canal block. Side rails up, monitors on throughout procedure. See vital signs in flow sheet. Tolerated Procedure well.  

## 2017-03-10 NOTE — Op Note (Signed)
DATE OF SURGERY:  03/10/2017  TIME: 5:49 PM  PATIENT NAME:  Gina Ryan    AGE: 61 y.o.   PRE-OPERATIVE DIAGNOSIS:  Right knee osteoarthritis  POST-OPERATIVE DIAGNOSIS:  Right knee osteoarthritis  PROCEDURE:  Procedure(s): RIGHT TOTAL KNEE ARTHROPLASTY  SURGEON:  Gelena Klosinski ANDREW  ASSISTANT:  Bryson Stilwell, PA-C, present and scrubbed throughout the case, critical for assistance with exposure, retraction, instrumentation, and closure.  OPERATIVE IMPLANTS: Depuy PFC Sigma Rotating Platform.  Femur size 4, Tibia size 4, Patella size 38 3-peg oval button, with a 12.5 mm polyethylene insert.   PREOPERATIVE INDICATIONS:   Gina Ryan is a 61 y.o. year old female with end stage bone on bone arthritis of the knee who failed conservative treatment and elected for Total Knee Arthroplasty.   The risks, benefits, and alternatives were discussed at length including but not limited to the risks of infection, bleeding, nerve injury, stiffness, blood clots, the need for revision surgery, cardiopulmonary complications, among others, and they were willing to proceed.  OPERATIVE DESCRIPTION:  The patient was brought to the operative room and placed in a supine position.  Spinal anesthesia was administered.  IV antibiotics were given.  The lower extremity was prepped and draped in the usual sterile fashion.  Time out was performed.  The leg was elevated and exsanguinated and the tourniquet was inflated.  Anterior quadriceps tendon splitting approach was performed.  The patella was retracted and osteophytes were removed.  The anterior horn of the medial and lateral meniscus was removed and cruciate ligaments resected.   The distal femur was opened with the drill and the intramedullary distal femoral cutting jig was utilized, set at 5 degrees resecting 10 mm off the distal femur.  Care was taken to protect the collateral ligaments.  The distal femoral sizing jig was applied, taking care to  avoid notching.  Then the 4-in-1 cutting jig was applied and the anterior and posterior femur was cut, along with the chamfer cuts.    Then the extramedullary tibial cutting jig was utilized making the appropriate cut using the anterior tibial crest as a reference building in appropriate posterior slope.  Care was taken during the cut to protect the medial and collateral ligaments.  The proximal tibia was removed along with the posterior horns of the menisci.   The posterior medial femoral osteophytes and posterior lateral femoral osteophytes were removed.    The flexion gap was then measured and was symmetric with the extension gap, measured at 12.  I completed the distal femoral preparation using the appropriate jig to prepare the box.  The patella was then measured, and cut with the saw.    The proximal tibia sized and prepared accordingly with the reamer and the punch, and then all components were trialed with the trial insert.  The knee was found to have excellent balance and full motion.    The above named components were then cemented into place and all excess cement was removed.  The trial polyethylene component was in place during cementation, and then was exchanged for the real polyethylene component.    The knee was easily taken through a range of motion and the patella tracked well and the knee irrigated copiously and the parapatellar and subcutaneous tissue closed with vicryl, and monocryl with steri strips for the skin.  The arthrotomy was closed at 90 of flexion. The wounds were dressed with sterile gauze and the tourniquet released and the patient was awakened and returned to the PACU  in stable and satisfactory condition.  There were no complications.  Total tourniquet time was 85 minutes.

## 2017-03-11 LAB — BASIC METABOLIC PANEL
Anion gap: 8 (ref 5–15)
BUN: 13 mg/dL (ref 6–20)
CALCIUM: 8.5 mg/dL — AB (ref 8.9–10.3)
CHLORIDE: 104 mmol/L (ref 101–111)
CO2: 25 mmol/L (ref 22–32)
CREATININE: 0.63 mg/dL (ref 0.44–1.00)
GFR calc non Af Amer: >60 mL/min (ref >60)
Glucose, Bld: 154 mg/dL — ABNORMAL HIGH (ref 65–99)
Potassium: 3.9 mmol/L (ref 3.5–5.1)
SODIUM: 137 mmol/L (ref 135–145)

## 2017-03-11 LAB — CBC
HCT: 34.2 % — ABNORMAL LOW (ref 36.0–46.0)
Hemoglobin: 10.9 g/dL — ABNORMAL LOW (ref 12.0–15.0)
MCH: 26.4 pg (ref 26.0–34.0)
MCHC: 31.9 g/dL (ref 30.0–36.0)
MCV: 82.8 fL (ref 78.0–100.0)
Platelets: 248 10*3/uL (ref 150–400)
RBC: 4.13 MIL/uL (ref 3.87–5.11)
RDW: 14.2 % (ref 11.5–15.5)
WBC: 13.5 10*3/uL — ABNORMAL HIGH (ref 4.0–10.5)

## 2017-03-11 NOTE — Anesthesia Postprocedure Evaluation (Signed)
Anesthesia Post Note  Patient: Gina Ryan  Procedure(s) Performed: Procedure(s) (LRB): RIGHT TOTAL KNEE ARTHROPLASTY (Right)  Patient location during evaluation: PACU Anesthesia Type: Spinal Level of consciousness: awake and alert Pain management: pain level controlled Vital Signs Assessment: post-procedure vital signs reviewed and stable Respiratory status: spontaneous breathing and respiratory function stable Cardiovascular status: blood pressure returned to baseline and stable Postop Assessment: spinal receding Anesthetic complications: no       Last Vitals:  Vitals:   03/11/17 0558 03/11/17 1038  BP: 129/66 (!) 125/58  Pulse: 60 66  Resp: 18 17  Temp: 36.4 C 36.7 C    Last Pain:  Vitals:   03/11/17 1347  TempSrc:   PainSc: 5                  Tiajuana Amass

## 2017-03-11 NOTE — Progress Notes (Signed)
OT Cancellation Note  Patient Details Name: Gina Ryan MRN: [id number] DOB: [birth year]   Cancelled Treatment:    Reason Eval/Treat Not Completed: OT screened, no needs identified, will sign off.  Pt recently had other knee replaced. No OT needs  Luke Rigsbee 03/11/2017, 1:04 PM  Lesle Chris, OTR/L [phone] 03/11/2017

## 2017-03-11 NOTE — Care Management Note (Signed)
Case Management Note  Patient Details  Name: Gina Ryan MRN: [id number] Date of Birth: 1956-02-25  Subjective/Objective:   R TKR                 Action/Plan: Discharge Planning: NCM spoke to pt and she has RW and 3n1 bedside commode at home. Dtr at home to assist with care. [facility] pre[facility] arranged. Pt agreeable to [facility].   PCP Delano Metz  Expected Discharge Date:  03/12/17               Expected Discharge Plan:  [place]  In-House Referral:  NA  Discharge planning Services  CM Consult  Post Acute Care Choice:  Home Health Choice offered to:  Patient  DME Arranged:  N/A DME Agency:  NA  HH Arranged:  PT [place] Agency:  [facility] ([facility]  Status of Service:  Completed, signed off  If discussed at [employer], dates discussed:    Additional Comments:  Erenest Rasher, RN 03/11/2017, 4:47 PM

## 2017-03-11 NOTE — Progress Notes (Signed)
Physical Therapy Treatment Patient Details Name: Gina Ryan MRN: [id number] DOB: 1956-06-16 Today's Date: 03/11/2017    History of Present Illness Pt s/p R TKR and with hx of recent L TKR     PT Comments    Pt progressing well with mobility and eager for return home tomorrow.   Follow Up Recommendations  Home health PT     Equipment Recommendations  None recommended by PT    Recommendations for Other Services       Precautions / Restrictions Precautions Precautions: Knee;Fall Required Braces or Orthoses: Knee Immobilizer - Right Knee Immobilizer - Right: Discontinue once straight leg raise with < 10 degree lag Restrictions Weight Bearing Restrictions: No Other Position/Activity Restrictions: WBAT    Mobility  Bed Mobility Overal bed mobility: Needs Assistance Bed Mobility: Supine to Sit     Supine to sit: Min assist     General bed mobility comments: Pt up in chair and requests back to same  Transfers Overall transfer level: Needs assistance Equipment used: Rolling walker (2 wheeled) Transfers: Sit to/from Stand Sit to Stand: Min guard         General transfer comment: cues for LE management and use of UEs to self assist  Ambulation/Gait Ambulation/Gait assistance: Min guard Ambulation Distance (Feet): 85 Feet Assistive device: Rolling walker (2 wheeled) Gait Pattern/deviations: Step-to pattern;Decreased step length - right;Decreased step length - left;Shuffle;Trunk flexed Gait velocity: decr Gait velocity interpretation: Below normal speed for age/gender General Gait Details: cues for posture, sequence and position from [employer]            Wheelchair Mobility    Modified Rankin (Stroke Patients Only)       Balance Overall balance assessment: No apparent balance deficits (not formally assessed)                                          Cognition Arousal/Alertness: Awake/alert Behavior During Therapy: WFL for  tasks assessed/performed Overall Cognitive Status: Within Functional Limits for tasks assessed                                        Exercises Total Joint Exercises Ankle Circles/Pumps: AROM;Both;15 reps;Supine Quad Sets: AROM;Both;10 reps;Supine Heel Slides: AAROM;Right;15 reps;Supine Straight Leg Raises: AAROM;AROM;Right;15 reps;Supine Goniometric ROM: 3/5 quads with pt performing IND SLR; AAROM at knee -8 - 45    General Comments        Pertinent Vitals/Pain Pain Assessment: 0-10 Pain Score: 5  Pain Location: R knee Pain Descriptors / Indicators: Aching;Sore Pain Intervention(s): Limited activity within patient's tolerance;Monitored during session;Premedicated before session;Ice applied    Home Living Family/patient expects to be discharged to:: Private residence Living Arrangements: Alone Available Help at Discharge: Family Type of Home: House Home Access: Stairs to enter Entrance Stairs-Rails: Right;Left Home Layout: One level Home Equipment: Environmental consultant - 4 wheels;Crutches Additional Comments: Dtr from [place] here to assist pt in recovery    Prior Function Level of Independence: Independent          PT Goals (current goals can now be found in the care plan section) Acute Rehab PT Goals Patient Stated Goal: This knee turns out as well as the first one PT Goal Formulation: With patient Time For Goal Achievement: 03/14/17 Potential to Achieve Goals: Good Progress  towards PT goals: Progressing toward goals    Frequency    7X/week      PT Plan Current plan remains appropriate    Co-evaluation              AM-PAC PT "6 Clicks" Daily Activity  Outcome Measure  Difficulty turning over in bed (including adjusting bedclothes, sheets and blankets)?: A Little Difficulty moving from lying on back to sitting on the side of the bed? : A Little Difficulty sitting down on and standing up from a chair with arms (e.g., wheelchair, bedside commode,  etc,.)?: A Little Help needed moving to and from a bed to chair (including a wheelchair)?: A Little Help needed walking in hospital room?: A Little Help needed climbing 3-5 steps with a railing? : A Little 6 Click Score: 18    End of Session Equipment Utilized During Treatment: Gait belt Activity Tolerance: Patient tolerated treatment well Patient left: in chair;with call bell/phone within reach;with family/visitor present Nurse Communication: Mobility status PT Visit Diagnosis: Unsteadiness on feet (R26.81);Difficulty in walking, not elsewhere classified (R26.2)     Time: 5868-2574 PT Time Calculation (min) (ACUTE ONLY): 24 min  Charges:  $Gait Training: 8-22 mins $Therapeutic Exercise: 8-22 mins                    G Codes:       Pg [phone]    Alvaretta Eisenberger 03/11/2017, 3:01 PM

## 2017-03-11 NOTE — Discharge Instructions (Signed)

## 2017-03-11 NOTE — Progress Notes (Signed)
   Subjective: 1 Day Post-Op Procedure(s) (LRB): RIGHT TOTAL KNEE ARTHROPLASTY (Right) Patient reports pain as mild.   Patient seen in rounds for Dr. Theda Sers. Patient is well, and has had no acute complaints or problems. She reports that her pain is under good control. No issues overnight. No SOB or chest pain. Reports that foley catheter was just removed. Positive flatus.   Objective: Vital signs in last 24 hours: Temp:  [97.5 F (36.4 C)-98.5 F (36.9 C)] 97.6 F (36.4 C) (05/12 0558) Pulse Rate:  [48-76] 60 (05/12 0558) Resp:  [9-20] 18 (05/12 0558) BP: (119-160)/(52-87) 129/66 (05/12 0558) SpO2:  [100 %] 100 % (05/12 0558)  Intake/Output from previous day:  Intake/Output Summary (Last 24 hours) at 03/11/17 0830 Last data filed at 03/11/17 0634  Gross per 24 hour  Intake           3952.5 ml  Output             2520 ml  Net           1432.5 ml     Labs:  Recent Labs  03/11/17 0506  HGB 10.9*    Recent Labs  03/11/17 0506  WBC 13.5*  RBC 4.13  HCT 34.2*  PLT 248    Recent Labs  03/11/17 0506  NA 137  K 3.9  CL 104  CO2 25  BUN 13  CREATININE 0.63  GLUCOSE 154*  CALCIUM 8.5*    EXAM General - Patient is Alert and Oriented Extremity - Neurologically intact Intact pulses distally Dorsiflexion/Plantar flexion intact No cellulitis present Compartment soft Dressing - dressing C/D/I Motor Function - intact, moving foot and toes well on exam.  Hemovac pulled without difficulty.  Past Medical History:  Diagnosis Date  . Atypical chest pain   . GERD (gastroesophageal reflux disease)   . Hypertension   . Morbid obesity ([place])   . Osteoarthritis, knee     Assessment/Plan: 1 Day Post-Op Procedure(s) (LRB): RIGHT TOTAL KNEE ARTHROPLASTY (Right) Active Problems:   S/P knee replacement   Primary osteoarthritis of one knee, right  Estimated body mass index is 42.35 kg/m as calculated from the following:   Height as of 03/03/17: 5\' 6"  (1.676 m).  Weight as of 03/03/17: 119 kg (262 lb 6 oz). Advance diet Up with therapy D/C IV fluids Plan for discharge tomorrow  DVT Prophylaxis - Lovenox Weight-Bearing as tolerated  D/C O2 and Pulse OX and try on Room Air  Plan for therapy today and tomorrow. DC home tomorrow if she progresses well. Will continue to monitor labs. Continue to make sure that bladder and bowel function is intact.   Ardeen Jourdain, PA-C Orthopaedic Surgery 03/11/2017, 8:30 AM

## 2017-03-11 NOTE — Evaluation (Signed)
Physical Therapy Evaluation Patient Details Name: Gina Ryan MRN: [id number] DOB: 1956/03/04 Today's Date: 03/11/2017   History of Present Illness  Pt s/p R TKR and with hx of recent L TKR   Clinical Impression  Pt s/p R TKR and presents with functional mobility limitations 2* decreased R LE strength/ROM and post op pain limiting functional mobility.  Pt should progress to dc home with family assist and HHPT follow up.    Follow Up Recommendations Home health PT    Equipment Recommendations  None recommended by PT    Recommendations for Other Services       Precautions / Restrictions Precautions Precautions: Knee;Fall Required Braces or Orthoses: Knee Immobilizer - Right Knee Immobilizer - Right: Discontinue once straight leg raise with < 10 degree lag Restrictions Weight Bearing Restrictions: No Other Position/Activity Restrictions: WBAT      Mobility  Bed Mobility Overal bed mobility: Needs Assistance Bed Mobility: Supine to Sit     Supine to sit: Min assist     General bed mobility comments: cues for sequence and use of L LE to self assist  Transfers Overall transfer level: Needs assistance Equipment used: Rolling walker (2 wheeled) Transfers: Sit to/from Stand Sit to Stand: Min assist         General transfer comment: cues for LE management and use of UEs to self assist  Ambulation/Gait Ambulation/Gait assistance: Min assist Ambulation Distance (Feet): 18 Feet (18' twice to/from bathroom) Assistive device: Rolling walker (2 wheeled) Gait Pattern/deviations: Step-to pattern;Decreased step length - right;Decreased step length - left;Shuffle;Trunk flexed Gait velocity: decr Gait velocity interpretation: Below normal speed for age/gender General Gait Details: cues for posture, sequence and position from [employer]            Wheelchair Mobility    Modified Rankin (Stroke Patients Only)       Balance Overall balance assessment: No apparent  balance deficits (not formally assessed)                                           Pertinent Vitals/Pain Pain Assessment: 0-10 Pain Score: 5  Pain Location: R knee Pain Descriptors / Indicators: Aching;Sore Pain Intervention(s): Limited activity within patient's tolerance;Monitored during session;Premedicated before session;Ice applied    Home Living Family/patient expects to be discharged to:: Private residence Living Arrangements: Alone Available Help at Discharge: Family Type of Home: House Home Access: Stairs to enter Entrance Stairs-Rails: Psychiatric nurse of Steps: 5 Home Layout: One level Home Equipment: Environmental consultant - 4 wheels;Crutches Additional Comments: Dtr from [place] here to assist pt in recovery    Prior Function Level of Independence: Independent               Hand Dominance   Dominant Hand: Right    Extremity/Trunk Assessment   Upper Extremity Assessment Upper Extremity Assessment: Overall WFL for tasks assessed    Lower Extremity Assessment Lower Extremity Assessment: RLE deficits/detail    Cervical / Trunk Assessment Cervical / Trunk Assessment: Normal  Communication   Communication: No difficulties  Cognition Arousal/Alertness: Awake/alert Behavior During Therapy: WFL for tasks assessed/performed Overall Cognitive Status: Within Functional Limits for tasks assessed  General Comments      Exercises     Assessment/Plan    PT Assessment Patient needs continued PT services  PT Problem List Decreased strength;Decreased range of motion;Decreased activity tolerance;Decreased mobility;Pain;Decreased knowledge of use of DME;Obesity       PT Treatment Interventions DME instruction;Gait training;Stair training;Functional mobility training;Therapeutic activities;Therapeutic exercise;Patient/family education    PT Goals (Current goals can be found in the Care  Plan section)  Acute Rehab PT Goals Patient Stated Goal: This knee turns out as well as the first one PT Goal Formulation: With patient Time For Goal Achievement: 03/14/17 Potential to Achieve Goals: Good    Frequency 7X/week   Barriers to discharge        Co-evaluation               AM-PAC PT "6 Clicks" Daily Activity  Outcome Measure Difficulty turning over in bed (including adjusting bedclothes, sheets and blankets)?: A Little Difficulty moving from lying on back to sitting on the side of the bed? : A Little Difficulty sitting down on and standing up from a chair with arms (e.g., wheelchair, bedside commode, etc,.)?: A Little Help needed moving to and from a bed to chair (including a wheelchair)?: A Little Help needed walking in hospital room?: A Little Help needed climbing 3-5 steps with a railing? : A Little 6 Click Score: 18    End of Session Equipment Utilized During Treatment: Gait belt;Right knee immobilizer Activity Tolerance: Patient tolerated treatment well Patient left: in chair;with call bell/phone within reach Nurse Communication: Mobility status PT Visit Diagnosis: Unsteadiness on feet (R26.81);Difficulty in walking, not elsewhere classified (R26.2)    Time: 1030-1045 PT Time Calculation (min) (ACUTE ONLY): 15 min   Charges:   PT Evaluation $PT Eval Low Complexity: 1 Procedure     PT G Codes:        Pg [phone]   Dameian Crisman 03/11/2017, 1:25 PM

## 2017-03-12 LAB — CBC
HCT: 28.1 % — ABNORMAL LOW (ref 36.0–46.0)
Hemoglobin: 9.3 g/dL — ABNORMAL LOW (ref 12.0–15.0)
MCH: 26.4 pg (ref 26.0–34.0)
MCHC: 33.1 g/dL (ref 30.0–36.0)
MCV: 79.8 fL (ref 78.0–100.0)
Platelets: 212 10*3/uL (ref 150–400)
RBC: 3.52 MIL/uL — ABNORMAL LOW (ref 3.87–5.11)
RDW: 14.4 % (ref 11.5–15.5)
WBC: 11.7 10*3/uL — AB (ref 4.0–10.5)

## 2017-03-12 NOTE — Progress Notes (Signed)
Physical Therapy Treatment Patient Details Name: Gina Ryan MRN: [id number] DOB: [birth year] Today's Date: 03/12/2017    History of Present Illness Pt s/p R TKR and with hx of recent L TKR     PT Comments    Pt limited this am by increased pain but continues to progress with mobility including negotiating stairs.  Reviewed stairs, therex, and don/doff KI   Follow Up Recommendations  Home health PT     Equipment Recommendations  None recommended by PT    Recommendations for Other Services       Precautions / Restrictions Precautions Precautions: Knee;Fall Required Braces or Orthoses: Knee Immobilizer - Right Knee Immobilizer - Right: Discontinue once straight leg raise with < 10 degree lag Restrictions Weight Bearing Restrictions: No Other Position/Activity Restrictions: WBAT    Mobility  Bed Mobility               General bed mobility comments: Pt OOB and requests back to chair.  Pt states comfortable with ability to manage in/out bed with assist of dtr  Transfers Overall transfer level: Needs assistance Equipment used: Rolling walker (2 wheeled) Transfers: Sit to/from Stand Sit to Stand: Supervision         General transfer comment: cues for LE management and use of UEs to self assist  Ambulation/Gait Ambulation/Gait assistance: Min guard;Supervision Ambulation Distance (Feet): 80 Feet Assistive device: Rolling walker (2 wheeled) Gait Pattern/deviations: Step-to pattern;Decreased step length - right;Decreased step length - left;Shuffle;Trunk flexed Gait velocity: decr Gait velocity interpretation: Below normal speed for age/gender General Gait Details: min cues for posture, sequence and position from RW   Stairs Stairs: Yes   Stair Management: One rail Right;Step to pattern;Forwards;With crutches Number of Stairs: 2 General stair comments: min cues for sequence and foot/crutch placement  Wheelchair Mobility    Modified Rankin (Stroke  Patients Only)       Balance                                            Cognition Arousal/Alertness: Awake/alert Behavior During Therapy: WFL for tasks assessed/performed Overall Cognitive Status: Within Functional Limits for tasks assessed                                        Exercises Total Joint Exercises Ankle Circles/Pumps: AROM;Both;15 reps;Supine Quad Sets: AROM;Both;10 reps;Supine Heel Slides: AAROM;Right;15 reps;Supine Straight Leg Raises: Right;15 reps;Supine;AAROM    General Comments        Pertinent Vitals/Pain Pain Assessment: 0-10 Pain Score: 7  Pain Location: R knee Pain Descriptors / Indicators: Aching;Sore Pain Intervention(s): Limited activity within patient's tolerance;Monitored during session;Premedicated before session;Ice applied    Home Living                      Prior Function            PT Goals (current goals can now be found in the care plan section) Acute Rehab PT Goals Patient Stated Goal: This knee turns out as well as the first one PT Goal Formulation: With patient Time For Goal Achievement: 03/14/17 Potential to Achieve Goals: Good Progress towards PT goals: Progressing toward goals    Frequency    7X/week      PT Plan Current plan remains appropriate  Co-evaluation              AM-PAC PT "6 Clicks" Daily Activity  Outcome Measure  Difficulty turning over in bed (including adjusting bedclothes, sheets and blankets)?: A Little Difficulty moving from lying on back to sitting on the side of the bed? : A Little Difficulty sitting down on and standing up from a chair with arms (e.g., wheelchair, bedside commode, etc,.)?: A Little Help needed moving to and from a bed to chair (including a wheelchair)?: A Little Help needed walking in hospital room?: A Little Help needed climbing 3-5 steps with a railing? : A Little 6 Click Score: 18    End of Session Equipment  Utilized During Treatment: Gait belt;Right knee immobilizer Activity Tolerance: Patient tolerated treatment well Patient left: in chair;with call bell/phone within reach;with family/visitor present Nurse Communication: Mobility status PT Visit Diagnosis: Unsteadiness on feet (R26.81);Difficulty in walking, not elsewhere classified (R26.2)     Time: 4132-4401 PT Time Calculation (min) (ACUTE ONLY): 33 min  Charges:  $Gait Training: 8-22 mins $Therapeutic Exercise: 8-22 mins                    G Codes:       Pg [phone]    Rebeka Kimble 03/12/2017, 12:18 PM

## 2017-03-12 NOTE — Progress Notes (Signed)
    Subjective: 2 Days Post-Op Procedure(s) (LRB): RIGHT TOTAL KNEE ARTHROPLASTY (Right) Patient reports pain as 4 on 0-10 scale.   Denies CP or SOB.  Voiding without difficulty. Positive flatus. Pt ambulating well on walker. Pt reports having family at home for assistance. Objective: Vital signs in last 24 hours: Temp:  [98.1 F (36.7 C)-99.4 F (37.4 C)] 99.4 F (37.4 C) (05/13 0625) Pulse Rate:  [66-96] 84 (05/13 0625) Resp:  [16-18] 16 (05/13 0625) BP: (125-139)/(58-78) 139/63 (05/13 0625) SpO2:  [96 %-100 %] 96 % (05/13 0625)  Intake/Output from previous day: 05/12 0701 - 05/13 0700 In: 1080 [P.O.:1080] Out: -  Intake/Output this shift: No intake/output data recorded.  Labs:  Recent Labs  03/11/17 0506  HGB 10.9*    Recent Labs  03/11/17 0506  WBC 13.5*  RBC 4.13  HCT 34.2*  PLT 248    Recent Labs  03/11/17 0506  NA 137  K 3.9  CL 104  CO2 25  BUN 13  CREATININE 0.63  GLUCOSE 154*  CALCIUM 8.5*   No results for input(s): LABPT, INR in the last 72 hours.  Physical Exam: Neurologically intact ABD soft Sensation intact distally Dorsiflexion/Plantar flexion intact Incision: ace bandage and knee immobilizer in place Compartment soft  Assessment/Plan: 2 Days Post-Op Procedure(s) (LRB): RIGHT TOTAL KNEE ARTHROPLASTY (Right) Up with therapy  Pt may DC home today after cleared by PT Post op meds on chart  Meela Wareing, Darla Lesches for Dr. Melina Schools [facility] [phone] 03/12/2017, 7:44 AM    Patient ID: Gina Ryan, female   DOB: 02-16-1956, 61 y.o.   MRN: [id number]

## 2017-03-12 NOTE — Progress Notes (Signed)
Patient was given discharge instructions and prescriptions. All questions were answered and patient was taken to main entrance in wheelchair.

## 2017-03-13 ENCOUNTER — Encounter (HOSPITAL_COMMUNITY): Payer: Self-pay | Admitting: Specialist

## 2017-03-13 NOTE — Discharge Summary (Signed)
Physician Discharge Summary  Patient ID: Gina Ryan MRN: [id number] DOB/AGE: September 06, 1956 61 y.o.  Admit date: 03/10/2017 Discharge date: 03/13/2017  Admission Diagnoses: right oa  Discharge Diagnoses:  Active Problems:   S/P knee replacement   Primary osteoarthritis of one knee, right   Discharged Condition: good  Hospital Course:  Gina Ryan is a 61 y.o. who was admitted to [facility]. They were brought to the operating room on 03/10/2017 and underwent Procedure(s): RIGHT TOTAL KNEE ARTHROPLASTY.  Patient tolerated the procedure well and was later transferred to the recovery room and then to the orthopaedic floor for postoperative care.  They were given PO and IV analgesics for pain control following their surgery.  They were given 24 hours of postoperative antibiotics of  Anti-infectives    Start     Dose/Rate Route Frequency Ordered Stop   03/10/17 2200  ceFAZolin (ANCEF) IVPB 2g/100 mL premix     2 g 200 mL/hr over 30 Minutes Intravenous Every 6 hours 03/10/17 1935 03/11/17 0511   03/10/17 1330  ceFAZolin (ANCEF) IVPB 2g/100 mL premix     2 g 200 mL/hr over 30 Minutes Intravenous On call to O.R. 03/10/17 1317 03/10/17 1636     and started on DVT prophylaxis in the form of lovenox.   PT and OT were ordered for total joint protocol.  Discharge planning consulted to help with postop disposition and equipment needs.  Patient had a good night on the evening of surgery and started to get up OOB with therapy on day one.  Hemovac drain was pulled without difficulty.  Continued to work with therapy into day two.  Dressing was with normal limits.  The patient had progressed with therapy and meeting their goals. Patient was seen in rounds and was ready to go home.  Consults: n/a  Significant Diagnostic Studies: routine  Treatments: routine  Discharge Exam: Blood pressure 139/63, pulse 84, temperature 99.4 F (37.4 C), temperature source Oral, resp. rate 16, SpO2 96  %. Well nourished. Alert and oriented x3. RRR, Lungs clear, BS x4. Abdomen soft and non tender. Right Calf soft and non tender. Right knee dressing C/D/I. No DVT signs. Compartment soft. No signs of infection.  Right LE grossly neurovascular intact.  Disposition: 01-Home or Self Care  Discharge Instructions    Call MD / Call 911    Complete by:  As directed    If you experience chest pain or shortness of breath, CALL 911 and be transported to the hospital emergency room.  If you develope a fever above 101 F, pus (white drainage) or increased drainage or redness at the wound, or calf pain, call your surgeon's office.   Constipation Prevention    Complete by:  As directed    Drink plenty of fluids.  Prune juice may be helpful.  You may use a stool softener, such as Colace (over the counter) 100 mg twice a day.  Use MiraLax (over the counter) for constipation as needed.   Diet - low sodium heart healthy    Complete by:  As directed    Discharge instructions    Complete by:  As directed    INSTRUCTIONS AFTER JOINT REPLACEMENT   Remove items at home which could result in a fall. This includes throw rugs or furniture in walking pathways ICE to the affected joint every three hours while awake for 30 minutes at a time, for at least the first 3-5 days, and then as needed for pain and  swelling.  Continue to use ice for pain and swelling. You may notice swelling that will progress down to the foot and ankle.  This is normal after surgery.  Elevate your leg when you are not up walking on it.   Continue to use the breathing machine you got in the hospital (incentive spirometer) which will help keep your temperature down.  It is common for your temperature to cycle up and down following surgery, especially at night when you are not up moving around and exerting yourself.  The breathing machine keeps your lungs expanded and your temperature down.   DIET:  As you were doing prior to hospitalization, we  recommend a well-balanced diet.  DRESSING / WOUND CARE / SHOWERING  Keep the surgical dressing until follow up.  The dressing is water proof, so you can shower without any extra covering.  IF THE DRESSING FALLS OFF or the wound gets wet inside, change the dressing with sterile gauze.  Please use good hand washing techniques before changing the dressing.  Do not use any lotions or creams on the incision until instructed by your surgeon.    ACTIVITY  Increase activity slowly as tolerated, but follow the weight bearing instructions below.   No driving for 6 weeks or until further direction given by your physician.  You cannot drive while taking narcotics.  No lifting or carrying greater than 10 lbs. until further directed by your surgeon. Avoid periods of inactivity such as sitting longer than an hour when not asleep. This helps prevent blood clots.  You may return to work once you are authorized by your doctor.     WEIGHT BEARING   Weight bearing as tolerated with assist device (walker, cane, etc) as directed, use it as long as suggested by your surgeon or therapist, typically at least 4-6 weeks.   EXERCISES  Results after joint replacement surgery are often greatly improved when you follow the exercise, range of motion and muscle strengthening exercises prescribed by your doctor. Safety measures are also important to protect the joint from further injury. Any time any of these exercises cause you to have increased pain or swelling, decrease what you are doing until you are comfortable again and then slowly increase them. If you have problems or questions, call your caregiver or physical therapist for advice.   Rehabilitation is important following a joint replacement. After just a few days of immobilization, the muscles of the leg can become weakened and shrink (atrophy).  These exercises are designed to build up the tone and strength of the thigh and leg muscles and to improve motion. Often  times heat used for twenty to thirty minutes before working out will loosen up your tissues and help with improving the range of motion but do not use heat for the first two weeks following surgery (sometimes heat can increase post-operative swelling).   These exercises can be done on a training (exercise) mat, on the floor, on a table or on a bed. Use whatever works the best and is most comfortable for you.    Use music or television while you are exercising so that the exercises are a pleasant break in your day. This will make your life better with the exercises acting as a break in your routine that you can look forward to.   Perform all exercises about fifteen times, three times per day or as directed.  You should exercise both the operative leg and the other leg as well.  Exercises include:   Quad Sets - Tighten up the muscle on the front of the thigh (Quad) and hold for 5-10 seconds.   Straight Leg Raises - With your knee straight (if you were given a brace, keep it on), lift the leg to 60 degrees, hold for 3 seconds, and slowly lower the leg.  Perform this exercise against resistance later as your leg gets stronger.  Leg Slides: Lying on your back, slowly slide your foot toward your buttocks, bending your knee up off the floor (only go as far as is comfortable). Then slowly slide your foot back down until your leg is flat on the floor again.  Angel Wings: Lying on your back spread your legs to the side as far apart as you can without causing discomfort.  Hamstring Strength:  Lying on your back, push your heel against the floor with your leg straight by tightening up the muscles of your buttocks.  Repeat, but this time bend your knee to a comfortable angle, and push your heel against the floor.  You may put a pillow under the heel to make it more comfortable if necessary.   A rehabilitation program following joint replacement surgery can speed recovery and prevent re-injury in the future due to  weakened muscles. Contact your doctor or a physical therapist for more information on knee rehabilitation.    CONSTIPATION  Constipation is defined medically as fewer than three stools per week and severe constipation as less than one stool per week.  Even if you have a regular bowel pattern at home, your normal regimen is likely to be disrupted due to multiple reasons following surgery.  Combination of anesthesia, postoperative narcotics, change in appetite and fluid intake all can affect your bowels.   YOU MUST use at least one of the following options; they are listed in order of increasing strength to get the job done.  They are all available over the counter, and you may need to use some, POSSIBLY even all of these options:    Drink plenty of fluids (prune juice may be helpful) and high fiber foods Colace 100 mg by mouth twice a day  Senokot for constipation as directed and as needed Dulcolax (bisacodyl), take with full glass of water  Miralax (polyethylene glycol) once or twice a day as needed.  If you have tried all these things and are unable to have a bowel movement in the first 3-4 days after surgery call either your surgeon or your primary doctor.    If you experience loose stools or diarrhea, hold the medications until you stool forms back up.  If your symptoms do not get better within 1 week or if they get worse, check with your doctor.  If you experience "the worst abdominal pain ever" or develop nausea or vomiting, please contact the office immediately for further recommendations for treatment.   ITCHING:  If you experience itching with your medications, try taking only a single pain pill, or even half a pain pill at a time.  You can also use Benadryl over the counter for itching or also to help with sleep.   TED HOSE STOCKINGS:  Use stockings on both legs until for at least 2 weeks or as directed by physician office. They may be removed at night for sleeping.  MEDICATIONS:  See  your medication summary on the "After Visit Summary" that nursing will review with you.  You may have some home medications which will be placed on hold until  you complete the course of blood thinner medication.  It is important for you to complete the blood thinner medication as prescribed.  PRECAUTIONS:  If you experience chest pain or shortness of breath - call 911 immediately for transfer to the hospital emergency department.   If you develop a fever greater that 101 F, purulent drainage from wound, increased redness or drainage from wound, foul odor from the wound/dressing, or calf pain - CONTACT YOUR SURGEON.                                                   FOLLOW-UP APPOINTMENTS:  If you do not already have a post-op appointment, please call the office for an appointment to be seen by your surgeon.  Guidelines for how soon to be seen are listed in your "After Visit Summary", but are typically between 1-4 weeks after surgery.  OTHER INSTRUCTIONS:   Knee Replacement:  Do not place pillow under knee, focus on keeping the knee straight while resting. CPM instructions: 0-90 degrees, 2 hours in the morning, 2 hours in the afternoon, and 2 hours in the evening. Place foam block, curve side up under heel at all times except when in CPM or when walking.  DO NOT modify, tear, cut, or change the foam block in any way.  MAKE SURE YOU:  Understand these instructions.  Get help right away if you are not doing well or get worse.    Thank you for letting us be a part of your medical care team.  It is a privilege we respect greatly.  We hope these instructions will help you stay on track for a fast and full recovery!   Increase activity slowly as tolerated    Complete by:  As directed      Allergies as of 03/12/2017      Reactions   Hydrocodone-acetaminophen    REACTION: Shortness of Breath   Diovan [valsartan]    Eye redness. No current issues with losartan      Medication List    STOP taking  these medications   meloxicam 7.5 MG tablet Commonly known as:  MOBIC     TAKE these medications   acetaminophen-codeine 300-30 MG tablet Commonly known as:  TYLENOL #3 take 1 tablet by mouth every 4 to 6 hours if needed for pain   aspirin EC 325 MG tablet Take 1 tablet (325 mg total) by mouth 2 (two) times daily.   levothyroxine 50 MCG tablet Commonly known as:  SYNTHROID, LEVOTHROID Take 1 tablet (50 mcg total) by mouth daily. What changed:  when to take this   losartan-hydrochlorothiazide 50-12.5 MG tablet Commonly known as:  HYZAAR take 1 tablet by mouth once daily   methocarbamol 500 MG tablet Commonly known as:  ROBAXIN Take 1 tablet (500 mg total) by mouth every 8 (eight) hours as needed for muscle spasms.   omeprazole 40 MG capsule Commonly known as:  PRILOSEC Take 1 capsule (40 mg total) by mouth daily. What changed:  when to take this   oxyCODONE 5 MG/5ML solution Commonly known as:  ROXICODONE Take 5-10 mLs (5-10 mg total) by mouth every 4 (four) hours as needed for severe pain.   potassium chloride 10 MEQ tablet Commonly known as:  K-DUR,KLOR-CON Take 1 tablet (10 mEq total) by mouth daily. What changed:  when to  take this  reasons to take this   triamcinolone 0.025 % ointment Commonly known as:  KENALOG Apply 1 application topically 2 (two) times daily. What changed:  when to take this   Vitamin D (Ergocalciferol) 50000 units Caps capsule Commonly known as:  DRISDOL Take 1 capsule (50,000 Units total) by mouth every 7 (seven) days. For 8 weeks then take twice monthly for maintenance What changed:  when to take this  additional instructions      Follow-up Information    [facility] Follow up.   Specialty:  Home Health Services Why:  Home Health Physical Therapy Contact information: [place] [id number] [phone]        Sydnee Cabal, MD. Schedule an appointment as soon as possible for a visit in 2  week(s).   Specialty:  Orthopedic Surgery Contact information: [address] [place] [address] [phone]           Signed: Lajean Manes 03/13/2017, 9:19 AM

## 2017-03-23 ENCOUNTER — Telehealth: Payer: Self-pay | Admitting: Family Medicine

## 2017-03-23 NOTE — Telephone Encounter (Signed)
Pt has enrolled in [facility] patient management program  [employer] Operation of hours:8-5 Monday - Friday

## 2017-03-24 NOTE — Telephone Encounter (Signed)
Message noted.

## 2017-07-20 ENCOUNTER — Encounter: Payer: Self-pay | Admitting: Family Medicine

## 2017-08-15 ENCOUNTER — Encounter: Payer: Self-pay | Admitting: Gastroenterology

## 2017-08-26 ENCOUNTER — Other Ambulatory Visit: Payer: Self-pay | Admitting: Family Medicine

## 2017-08-26 DIAGNOSIS — I10 Essential (primary) hypertension: Secondary | ICD-10-CM

## 2017-09-20 ENCOUNTER — Ambulatory Visit: Payer: BLUE CROSS/BLUE SHIELD | Admitting: Nurse Practitioner

## 2017-10-04 ENCOUNTER — Ambulatory Visit: Payer: BLUE CROSS/BLUE SHIELD | Admitting: Nurse Practitioner

## 2017-12-18 ENCOUNTER — Encounter: Payer: Self-pay | Admitting: Nurse Practitioner

## 2017-12-18 ENCOUNTER — Other Ambulatory Visit (INDEPENDENT_AMBULATORY_CARE_PROVIDER_SITE_OTHER): Payer: BLUE CROSS/BLUE SHIELD

## 2017-12-18 ENCOUNTER — Ambulatory Visit: Payer: BLUE CROSS/BLUE SHIELD | Admitting: Nurse Practitioner

## 2017-12-18 VITALS — BP 128/88 | HR 80 | Temp 98.7°F | Resp 16 | Ht 66.0 in | Wt 261.0 lb

## 2017-12-18 DIAGNOSIS — I10 Essential (primary) hypertension: Secondary | ICD-10-CM

## 2017-12-18 DIAGNOSIS — E039 Hypothyroidism, unspecified: Secondary | ICD-10-CM | POA: Diagnosis not present

## 2017-12-18 DIAGNOSIS — K219 Gastro-esophageal reflux disease without esophagitis: Secondary | ICD-10-CM

## 2017-12-18 DIAGNOSIS — Z96653 Presence of artificial knee joint, bilateral: Secondary | ICD-10-CM | POA: Diagnosis not present

## 2017-12-18 DIAGNOSIS — R002 Palpitations: Secondary | ICD-10-CM | POA: Diagnosis not present

## 2017-12-18 LAB — CBC WITH DIFFERENTIAL/PLATELET
Basophils Absolute: 0.1 10*3/uL (ref 0.0–0.1)
Basophils Relative: 0.7 % (ref 0.0–3.0)
EOS PCT: 1.5 % (ref 0.0–5.0)
Eosinophils Absolute: 0.1 10*3/uL (ref 0.0–0.7)
HCT: 37.5 % (ref 36.0–46.0)
Hemoglobin: 12.4 g/dL (ref 12.0–15.0)
LYMPHS ABS: 1.8 10*3/uL (ref 0.7–4.0)
Lymphocytes Relative: 23.6 % (ref 12.0–46.0)
MCHC: 32.9 g/dL (ref 30.0–36.0)
MCV: 82.8 fl (ref 78.0–100.0)
MONOS PCT: 7 % (ref 3.0–12.0)
Monocytes Absolute: 0.5 10*3/uL (ref 0.1–1.0)
Neutro Abs: 5 10*3/uL (ref 1.4–7.7)
Neutrophils Relative %: 67.2 % (ref 43.0–77.0)
Platelets: 261 10*3/uL (ref 150.0–400.0)
RBC: 4.53 Mil/uL (ref 3.87–5.11)
RDW: 14.6 % (ref 11.5–15.5)
WBC: 7.4 10*3/uL (ref 4.0–10.5)

## 2017-12-18 LAB — COMPREHENSIVE METABOLIC PANEL
ALBUMIN: 3.9 g/dL (ref 3.5–5.2)
ALT: 11 U/L (ref 0–35)
AST: 14 U/L (ref 0–37)
Alkaline Phosphatase: 151 U/L — ABNORMAL HIGH (ref 39–117)
BUN: 18 mg/dL (ref 6–23)
CHLORIDE: 103 meq/L (ref 96–112)
CO2: 29 mEq/L (ref 19–32)
Calcium: 8.7 mg/dL (ref 8.4–10.5)
Creatinine, Ser: 0.72 mg/dL (ref 0.40–1.20)
GFR: 105.52 mL/min (ref >60.00)
Glucose, Bld: 99 mg/dL (ref 70–99)
POTASSIUM: 4.2 meq/L (ref 3.5–5.1)
SODIUM: 139 meq/L (ref 135–145)
Total Bilirubin: 0.3 mg/dL (ref 0.2–1.2)
Total Protein: 7.1 g/dL (ref 6.0–8.3)

## 2017-12-18 LAB — TSH: TSH: 0.56 u[IU]/mL (ref 0.35–4.50)

## 2017-12-18 NOTE — Assessment & Plan Note (Signed)
Stable, continue meds - CBC with Differential/Platelet; Future - Comprehensive metabolic panel; Future

## 2017-12-18 NOTE — Patient Instructions (Addendum)
Please head downstairs for lab work.  Id like to see you back in about 3 months for your annual physical, or sooner if you need me.  It was nice to meet you. Thanks for letting me take care of you today :)

## 2017-12-18 NOTE — Assessment & Plan Note (Signed)
She does report uncontrolled symptoms when she skips doses of prilosec Discussed importance of daily medication compliance in the management of GERD She will follow up if symptoms persist after resuming daily prilosec

## 2017-12-18 NOTE — Assessment & Plan Note (Signed)
-   TSH; Future

## 2017-12-18 NOTE — Progress Notes (Signed)
Name: Gina Ryan   MRN: [id number]    DOB: [birth year]   Date:12/18/2017       Progress Note  Subjective  Chief Complaint  Chief Complaint  Patient presents with  . Establish Care    Handicap placard    HPI [name] is coming in to establish care today. She would like a handicap placard due to knee pain.  Hypertension -maintained on losartan-hydrochlorothiazide 50-12.5 daily. Reports daily medication compliance without adverse medication effects. Reports she does not routinely check her blood pressure at home because she does not feel bad. Denies headaches, vision changes, chest pain, shortness of breath, edema.  BP Readings from Last 3 Encounters:  12/18/17 128/88  03/12/17 139/63  03/03/17 128/82   Hypothryoidism-maintained on levothyroxine 24mcg once daily Denies decreased appetite, constipation. She reports fatigue, palpitations, dry skin recently-thinks this is due to her thyroid. She also feels she has gained weight. She denies fevers, syncope, confusion.  Lab Results  Component Value Date   TSH 0.48 10/17/2016   Wt Readings from Last 3 Encounters:  12/18/17 261 lb (118.4 kg)  03/03/17 262 lb 6 oz (119 kg)  02/02/17 261 lb 6.4 oz (118.6 kg)   GERD-maintained on prilosec which she admits she often forgets to take. She notices symptoms of GERD-heartburn, regurgitation, cough- when she does not take prilosec.  Chronic knee pain- Shes had ongoing knee pain for many years Bilateral knee replacements complete in 2017, 2018. She has continued right knee pain despite knee replacement and has difficulty ambulating long distances due to the pain. She denies weakness, falls, joint swelling. She does try to exercise daily to improve the pain and she follows up with orthopedic provider for continued care.   Patient Active Problem List   Diagnosis Date Noted  . Primary osteoarthritis of one knee, right 03/10/2017  . Weight gain 03/03/2014  . Elevated parathyroid  hormone 03/03/2014  . Hypothyroidism 03/03/2014  . Obesity 01/19/2011  . NECK PAIN, LEFT 04/07/2010  . S/P knee replacement 12/13/2007  . Essential hypertension 09/05/2007  . GERD 09/05/2007  . Abnormal glucose 09/05/2007  . OSTEOARTHRITIS, KNEE 08/07/2007  . TOTAL HYSTERECTOMY AND BILATERAL SALPINGOOPHERECTOMY, HX OF 08/07/2007  . CHOLECYSTECTOMY, HX OF 08/07/2007    Past Surgical History:  Procedure Laterality Date  . CHOLECYSTECTOMY    . COSMETIC SURGERY  07/2010   (lower facelift, lower blepharoplasty)  . GASTRIC BYPASS    . LAPAROSCOPIC GASTRIC BANDING    . ROTATOR CUFF REPAIR Bilateral   . TOTAL ABDOMINAL HYSTERECTOMY    . TOTAL KNEE ARTHROPLASTY Left 10/28/2016   Procedure: LEFT TOTAL KNEE ARTHROPLASTY;  Surgeon: Sydnee Cabal, MD;  Location: [facility];  Service: Orthopedics;  Laterality: Left;  . TOTAL KNEE ARTHROPLASTY Right 03/10/2017   Procedure: RIGHT TOTAL KNEE ARTHROPLASTY;  Surgeon: Sydnee Cabal, MD;  Location: [facility];  Service: Orthopedics;  Laterality: Right;    Family History  Problem Relation Age of Onset  . Stroke Mother   . Hypertension Mother   . Cancer Brother        diagnosed in late 27's    Social History   Socioeconomic History  . Marital status: Single    Spouse name: Not on file  . Number of children: Not on file  . Years of education: Not on file  . Highest education level: Not on file  Social Needs  . Financial resource strain: Not on file  . Food insecurity - worry: Not on file  . Food insecurity -  inability: Not on file  . Transportation needs - medical: Not on file  . Transportation needs - non-medical: Not on file  Occupational History  . 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 Smoking status: Never Smoker  . Smokeless tobacco: Never Used  Substance and Sexual Activity  . Alcohol use: No  . Drug use: No  . Sexual activity: Not on file  Other Topics Concern  . Not on file  Social  History Narrative   Lives alone   Daugher , son and 4 grand children live in the are      Widowed - husband died 54 yrs ago     Current Outpatient Medications:  .  levothyroxine (SYNTHROID, LEVOTHROID) 26 MCG tablet, take 1 tablet by mouth once daily, Disp: 90 tablet, Rfl: 1 .  losartan-hydrochlorothiazide (HYZAAR) 50-12.5 MG tablet, take 1 tablet by mouth once daily, Disp: 90 tablet, Rfl: 1 .  omeprazole (PRILOSEC) 40 MG capsule, take 1 capsule by mouth once daily, Disp: 90 capsule, Rfl: 1 .  triamcinolone (KENALOG) 0.025 % ointment, Apply 1 application topically 2 (two) times daily. (Patient taking differently: Apply 1 application topically every other day. ), Disp: 30 g, Rfl: 3  Allergies  Allergen Reactions  . Hydrocodone-Acetaminophen     REACTION: Shortness of Breath  . Diovan [Valsartan]     Eye redness. No current issues with losartan     ROS See HPI  Objective  Vitals:   12/18/17 0800  BP: 128/88  Pulse: 80  Resp: 16  Temp: 98.7 F (37.1 C)  TempSrc: Oral  SpO2: 95%  Weight: 261 lb (118.4 kg)  Height: 5\' 6"  (1.676 m)   Body mass index is 42.13 kg/m.  Physical Exam Vital signs reviewed. Constitutional: Patient appears well-developed and well-nourished. Obese. No distress.  HENT: Head: Normocephalic and atraumatic Nose: Nose normal. Mouth/Throat: Oropharynx is clear and moist. No oropharyngeal exudate.  Eyes: Conjunctivae and EOM are normal. No scleral icterus.  Neck: Normal range of motion. Neck supple. Cardiovascular: Normal rate, regular rhythm and normal heart sounds.  No murmur heard. No BLE edema. Distal pulses intact. Pulmonary/Chest: Effort normal and breath sounds normal. No respiratory distress. Abdominal: Soft. Bowel sounds are normal, no distension.  Musculoskeletal: Normal range of motion, no joint effusions. No gross deformities Neurological: She is alert and oriented to person, place, and time. Coordination, balance, strength, speech and gait  are normal.  Skin: Skin is warm and dry. No rash noted. No erythema.  Psychiatric: Patient has a normal mood and affect. behavior is normal. Judgment and thought content normal.  Assessment & Plan  RTC in 3 months for CPE   Palpitations - EKG 12-Lead-I personally reviewed the patients EKG today which read sinus rhythm with frequent PACs. - CBC with Differential/Platelet; Future - TSH; Future - Comprehensive metabolic panel; Future Will consider referral to cardiology for continued palpitations. She was instructed to f/u for no improvement or worsening symptoms.

## 2017-12-18 NOTE — Assessment & Plan Note (Signed)
Will fill form for handicap placard She plans to continue following with orthopedic provider for her knee pain

## 2018-02-21 ENCOUNTER — Other Ambulatory Visit: Payer: Self-pay | Admitting: Family Medicine

## 2018-02-21 DIAGNOSIS — I10 Essential (primary) hypertension: Secondary | ICD-10-CM

## 2018-02-22 NOTE — Telephone Encounter (Signed)
Pt nolonger sees Dr Sherren Mocha, pt request for refills on the Rx listed below,please Advise.

## 2018-03-19 ENCOUNTER — Encounter: Payer: BLUE CROSS/BLUE SHIELD | Admitting: Nurse Practitioner

## 2018-03-27 ENCOUNTER — Other Ambulatory Visit: Payer: Self-pay | Admitting: Nurse Practitioner

## 2018-03-27 ENCOUNTER — Ambulatory Visit (INDEPENDENT_AMBULATORY_CARE_PROVIDER_SITE_OTHER): Payer: BLUE CROSS/BLUE SHIELD | Admitting: Nurse Practitioner

## 2018-03-27 ENCOUNTER — Other Ambulatory Visit (INDEPENDENT_AMBULATORY_CARE_PROVIDER_SITE_OTHER): Payer: BLUE CROSS/BLUE SHIELD

## 2018-03-27 ENCOUNTER — Encounter: Payer: Self-pay | Admitting: Nurse Practitioner

## 2018-03-27 VITALS — BP 158/78 | HR 60 | Temp 97.9°F | Resp 16 | Ht 66.0 in | Wt 285.0 lb

## 2018-03-27 DIAGNOSIS — Z1231 Encounter for screening mammogram for malignant neoplasm of breast: Secondary | ICD-10-CM

## 2018-03-27 DIAGNOSIS — Z23 Encounter for immunization: Secondary | ICD-10-CM

## 2018-03-27 DIAGNOSIS — Z13220 Encounter for screening for lipoid disorders: Secondary | ICD-10-CM

## 2018-03-27 DIAGNOSIS — R350 Frequency of micturition: Secondary | ICD-10-CM

## 2018-03-27 DIAGNOSIS — R739 Hyperglycemia, unspecified: Secondary | ICD-10-CM

## 2018-03-27 DIAGNOSIS — Z0001 Encounter for general adult medical examination with abnormal findings: Secondary | ICD-10-CM | POA: Insufficient documentation

## 2018-03-27 DIAGNOSIS — R7309 Other abnormal glucose: Secondary | ICD-10-CM | POA: Diagnosis not present

## 2018-03-27 DIAGNOSIS — Z114 Encounter for screening for human immunodeficiency virus [HIV]: Secondary | ICD-10-CM

## 2018-03-27 DIAGNOSIS — Z1159 Encounter for screening for other viral diseases: Secondary | ICD-10-CM

## 2018-03-27 DIAGNOSIS — Z9189 Other specified personal risk factors, not elsewhere classified: Secondary | ICD-10-CM

## 2018-03-27 DIAGNOSIS — I10 Essential (primary) hypertension: Secondary | ICD-10-CM | POA: Diagnosis not present

## 2018-03-27 DIAGNOSIS — Z1239 Encounter for other screening for malignant neoplasm of breast: Secondary | ICD-10-CM

## 2018-03-27 DIAGNOSIS — Z1211 Encounter for screening for malignant neoplasm of colon: Secondary | ICD-10-CM | POA: Diagnosis not present

## 2018-03-27 DIAGNOSIS — R748 Abnormal levels of other serum enzymes: Secondary | ICD-10-CM

## 2018-03-27 DIAGNOSIS — Z0000 Encounter for general adult medical examination without abnormal findings: Secondary | ICD-10-CM | POA: Diagnosis not present

## 2018-03-27 LAB — URINALYSIS
Bilirubin Urine: NEGATIVE
HGB URINE DIPSTICK: NEGATIVE
Ketones, ur: NEGATIVE
Leukocytes, UA: NEGATIVE
NITRITE: NEGATIVE
PH: 6 (ref 5.0–8.0)
SPECIFIC GRAVITY, URINE: 1.01 (ref 1.000–1.030)
TOTAL PROTEIN, URINE-UPE24: NEGATIVE
Urine Glucose: NEGATIVE
Urobilinogen, UA: 0.2 (ref 0.0–1.0)

## 2018-03-27 LAB — COMPREHENSIVE METABOLIC PANEL
ALT: 11 U/L (ref 0–35)
AST: 14 U/L (ref 0–37)
Albumin: 3.9 g/dL (ref 3.5–5.2)
Alkaline Phosphatase: 201 U/L — ABNORMAL HIGH (ref 39–117)
BILIRUBIN TOTAL: 0.4 mg/dL (ref 0.2–1.2)
BUN: 16 mg/dL (ref 6–23)
CO2: 29 mEq/L (ref 19–32)
Calcium: 9.1 mg/dL (ref 8.4–10.5)
Chloride: 102 mEq/L (ref 96–112)
Creatinine, Ser: 0.65 mg/dL (ref 0.40–1.20)
GFR: 118.64 mL/min (ref >60.00)
GLUCOSE: 103 mg/dL — AB (ref 70–99)
Potassium: 4.1 mEq/L (ref 3.5–5.1)
Sodium: 138 mEq/L (ref 135–145)
Total Protein: 7.7 g/dL (ref 6.0–8.3)

## 2018-03-27 LAB — HEMOGLOBIN A1C: Hgb A1c MFr Bld: 6.1 % (ref 4.6–6.5)

## 2018-03-27 LAB — LIPID PANEL
Cholesterol: 224 mg/dL — ABNORMAL HIGH (ref 0–200)
HDL: 99.8 mg/dL (ref >39.00)
LDL Cholesterol: 112 mg/dL — ABNORMAL HIGH (ref 0–99)
NONHDL: 124.09
Total CHOL/HDL Ratio: 2
Triglycerides: 59 mg/dL (ref 0.0–149.0)
VLDL: 11.8 mg/dL (ref 0.0–40.0)

## 2018-03-27 NOTE — Progress Notes (Signed)
orders

## 2018-03-27 NOTE — Progress Notes (Signed)
Name: Gina Ryan   MRN: [id number]    DOB: [birth year]   Date:03/27/2018       Progress Note  Subjective  Chief Complaint  Chief Complaint  Patient presents with  . CPE    fasting    HPI  Patient presents for annual CPE.  Diet, Exercise: has not been watching her diet or exercising, she is thinking of starting a liquid, vegetable diet with her daughter  USPSTF grade A and B recommendations  Depression: no concerns for anxiety or depression Depression screen [facility] 2/9 12/18/2017  Decreased Interest 0  Down, Depressed, Hopeless 0  PHQ - 2 Score 0   Hypertension: maintained on hyzaar 50-12.5 with reported daily medication compliuance She does not routinely check her blood pressure readings at home. currently being treated by orthopedics provider for back pain, recent course of NSAIDS BP Readings from Last 3 Encounters:  03/27/18 (!) 158/78  12/18/17 128/88  03/12/17 139/63   Obesity: Wt Readings from Last 3 Encounters:  03/27/18 285 lb (129.3 kg)  12/18/17 261 lb (118.4 kg)  03/03/17 262 lb 6 oz (119 kg)   BMI Readings from Last 3 Encounters:  03/27/18 46.00 kg/m  12/18/17 42.13 kg/m  03/03/17 42.35 kg/m    Alcohol: social drink occassionally Tobacco use: no , never  HIV, hep C: HIV, Hep c screening ordered today STD testing and prevention (chl/gon/syphilis): no concerns, declines Intimate partner violence: denies, feels safe  Incontinence Symptoms: c/o urinary frequency, for about 1 year, occassionally experiences urinary dribbling. Denies dysuria, hematuria.  Vaccinations: TDAP today  Advanced Care Planning: A voluntary discussion about advance care planning including the explanation and discussion of advance directives.  Discussed health care proxy and Living will, and the patient DOES have a living will at present time. If patient does have living will, I have requested they bring this to the clinic to be scanned in to their chart.  Breast cancer: screening  mammogram ordered today [facility] Mammogram  Date Value Ref Range Status  11/16/2009 Normal      Cervical cancer screening: s/p total hysterectomy, PAP discontinued  Lipids: lipid panel today Lab Results  Component Value Date   CHOL 187 07/07/2016   CHOL 187 11/18/2013   CHOL 221 (H) 01/19/2011   Lab Results  Component Value Date   HDL 84.30 07/07/2016   HDL 89.90 11/18/2013   HDL 87 01/19/2011   Lab Results  Component Value Date   LDLCALC 91 07/07/2016   LDLCALC 84 11/18/2013   LDLCALC 124 (H) 01/19/2011   Lab Results  Component Value Date   TRIG 58.0 07/07/2016   TRIG 64.0 11/18/2013   TRIG 48 01/19/2011   Lab Results  Component Value Date   CHOLHDL 2 07/07/2016   CHOLHDL 2 11/18/2013   CHOLHDL 2.5 01/19/2011   Lab Results  Component Value Date   LDLDIRECT 99.0 12/15/2008    Glucose:  Glucose, Bld  Date Value Ref Range Status  12/18/2017 99 70 - 99 mg/dL Final  03/11/2017 154 (H) 65 - 99 mg/dL Final  03/03/2017 101 (H) 65 - 99 mg/dL Final    Skin cancer: does not wear sunscreen  Colorectal cancer: No personal or family history of colon ca, no abdominal pain, no bowel changes, no rectal bleeding. colonoscopy referral placed for screening colonoscopy today  Aspirin: not indicated ECG: not indicated  Patient Active Problem List   Diagnosis Date Noted  . Primary osteoarthritis of one knee, right 03/10/2017  . Elevated parathyroid hormone  03/03/2014  . Hypothyroidism 03/03/2014  . Obesity 01/19/2011  . S/P knee replacement 12/13/2007  . Essential hypertension 09/05/2007  . GERD 09/05/2007  . Abnormal glucose 09/05/2007  . OSTEOARTHRITIS, KNEE 08/07/2007  . TOTAL HYSTERECTOMY AND BILATERAL SALPINGOOPHERECTOMY, HX OF 08/07/2007  . CHOLECYSTECTOMY, HX OF 08/07/2007    Past Surgical History:  Procedure Laterality Date  . CHOLECYSTECTOMY    . COSMETIC SURGERY  07/2010   (lower facelift, lower blepharoplasty)  . GASTRIC BYPASS    . LAPAROSCOPIC GASTRIC  BANDING    . ROTATOR CUFF REPAIR Bilateral   . TOTAL ABDOMINAL HYSTERECTOMY    . TOTAL KNEE ARTHROPLASTY Left 10/28/2016   Procedure: LEFT TOTAL KNEE ARTHROPLASTY;  Surgeon: Sydnee Cabal, MD;  Location: [facility];  Service: Orthopedics;  Laterality: Left;  . TOTAL KNEE ARTHROPLASTY Right 03/10/2017   Procedure: RIGHT TOTAL KNEE ARTHROPLASTY;  Surgeon: Sydnee Cabal, MD;  Location: [facility];  Service: Orthopedics;  Laterality: Right;    Family History  Problem Relation Age of Onset  . Stroke Mother   . Hypertension Mother   . Cancer Brother        diagnosed in late 80's    Social History   Socioeconomic History  . Marital status: Single    Spouse name: Not on file  . Number of children: Not on file  . Years of education: Not on file  . Highest education level: Not on file  Occupational History  . Occupation: Catering manager: [employer]  . Financial resource strain: Not on file  . Food insecurity:    Worry: Not on file    Inability: Not on file  . Transportation needs:    Medical: Not on file    Non-medical: Not on file  Tobacco Use  . Smoking status: Never Smoker  . Smokeless tobacco: Never Used  Substance and Sexual Activity  . Alcohol use: No  . Drug use: No  . Sexual activity: Not on file  Lifestyle  . Physical activity:    Days per week: Not on file    Minutes per session: Not on file  . Stress: Not on file  Relationships  . Social connections:    Talks on phone: Not on file    Gets together: Not on file    Attends religious service: Not on file    Active member of club or organization: Not on file    Attends meetings of clubs or organizations: Not on file    Relationship status: Not on file  . Intimate partner violence:    Fear of current or ex partner: Not on file    Emotionally abused: Not on file    Physically abused: Not on file    Forced sexual activity: Not on file  Other Topics Concern  . Not on file   Social History Narrative   Lives alone   Daugher , son and 4 grand children live in the are      Widowed - husband died 60 yrs ago     Current Outpatient Medications:  .  levothyroxine (SYNTHROID, LEVOTHROID) 50 MCG tablet, TAKE 1 TABLET BY MOUTH ONCE DAILY, Disp: 90 tablet, Rfl: 0 .  losartan-hydrochlorothiazide (HYZAAR) 50-12.5 MG tablet, TAKE 1 TABLET BY MOUTH ONCE DAILY, Disp: 90 tablet, Rfl: 0 .  omeprazole (PRILOSEC) 40 MG capsule, TAKE 1 CAPSULE BY MOUTH ONCE DAILY, Disp: 90 capsule, Rfl: 0 .  triamcinolone (KENALOG) 0.025 % ointment, Apply 1 application  topically 2 (two) times daily. (Patient taking differently: Apply 1 application topically every other day. ), Disp: 30 g, Rfl: 3  Allergies  Allergen Reactions  . Hydrocodone-Acetaminophen     REACTION: Shortness of Breath  . Diovan [Valsartan]     Eye redness. No current issues with losartan     ROS  Constitutional: Negative for fever or weight change.  Respiratory: Negative for cough and shortness of breath.   Cardiovascular: Negative for chest pain or palpitations.  Gastrointestinal: Negative for abdominal pain, no bowel changes.  Musculoskeletal: Negative for gait problem or joint swelling.  Skin: Negative for rash.  Neurological: Negative for dizziness or headache.  No other specific complaints in a complete review of systems (except as listed in HPI above).   Objective  Vitals:   03/27/18 1034  BP: (!) 158/78  Pulse: 60  Resp: 16  Temp: 97.9 F (36.6 C)  TempSrc: Oral  SpO2: 98%  Weight: 285 lb (129.3 kg)  Height: 5\' 6"  (1.676 m)    Body mass index is 46 kg/m.  Physical Exam Vital signs reviewed. Constitutional: Patient appears well-developed and well-nourished. Obese. No distress.  HENT: Head: Normocephalic and atraumatic. Ears: B TMs ok, no erythema or effusion; Nose: Nose normal. Mouth/Throat: Oropharynx is clear and moist. No oropharyngeal exudate.  Eyes: Conjunctivae and EOM are normal.  Pupils are equal, round, and reactive to light. No scleral icterus.  Neck: Normal range of motion. Neck supple. No cervical adenopathy. No thyromegaly present.  Cardiovascular: Normal rate, regular rhythm and normal heart sounds.  No murmur heard. No BLE edema. Distal pulses intact Pulmonary/Chest: Effort normal and breath sounds normal. No respiratory distress. Abdominal: Soft, rotund. Bowel sounds are normal, no distension. There is no tenderness. no masses. Musculoskeletal: Normal range of motion,  . No gross deformities Neurological: She is alert and oriented to person, place, and time. No cranial nerve deficit. Coordination, balance, strength, speech and gait are normal.  Skin: Skin is warm and dry. No rash noted. No erythema.  Psychiatric: Patient has a normal mood and affect. behavior is normal. Judgment and thought content normal.  Assessment & Plan RTC in 2 weeks for Nurse visit: BP recheck F/U OV TBD pending lab results CBC w/diff, TSH WNL 12/18/17  Urinary frequency Labs today F/U with further recommendations pending lab results - Urinalysis; Future - CULTURE, URINE COMPREHENSIVE; Future

## 2018-03-27 NOTE — Assessment & Plan Note (Signed)
-  USPSTF grade A and B recommendations reviewed with patient; age-appropriate recommendations, preventive care, screening tests, etc discussed and encouraged; healthy living and sunscreen use encouraged; see AVS for patient education given to patient -Discussed importance of 150 minutes of physical activity weekly, eat 6 servings of fruit/vegetables daily and drink plenty of water and avoid sweet beverages.  -Follow up and care instructions discussed and provided in AVS.  -Reviewed Health Maintenance: Encounter for hepatitis C virus screening test for high risk patient- Hepatitis C antibody; Future Screening for HIV (human immunodeficiency virus)- HIV antibody; Future Screening for breast cancer- MM DIGITAL SCREENING BILATERAL; Future Colon cancer screening- Ambulatory referral to Gastroenterology Need for Tdap vaccination- Tdap vaccine greater than or equal to 7yo IM  Screening for cholesterol level Fasting today Update lipid panel - Lipid panel; Future  Elevated random blood glucose level - Hemoglobin A1c; Future

## 2018-03-27 NOTE — Patient Instructions (Signed)
Please head downstairs for lab work/x-rays. If any of your test results are critically abnormal, you will be contacted right away. Your results may be released to your MyChart for viewing before I am able to provide you with my response. I will contact you within a week about your test results and any recommendations for abnormalities.  Please return for a nurse visit to have your blood pressure recheckd in about 2 weeks.  We will determine when you should come back and see me when I get your lab work back.  Health Maintenance, Female Adopting a healthy lifestyle and getting preventive care can go a long way to promote health and wellness. Talk with your health care provider about what schedule of regular examinations is right for you. This is a good chance for you to check in with your provider about disease prevention and staying healthy. In between checkups, there are plenty of things you can do on your own. Experts have done a lot of research about which lifestyle changes and preventive measures are most likely to keep you healthy. Ask your health care provider for more information. Weight and diet Eat a healthy diet  Be sure to include plenty of vegetables, fruits, low-fat dairy products, and lean protein.  Do not eat a lot of foods high in solid fats, added sugars, or salt.  Get regular exercise. This is one of the most important things you can do for your health. ? Most adults should exercise for at least 150 minutes each week. The exercise should increase your heart rate and make you sweat (moderate-intensity exercise). ? Most adults should also do strengthening exercises at least twice a week. This is in addition to the moderate-intensity exercise.  Maintain a healthy weight  Body mass index (BMI) is a measurement that can be used to identify possible weight problems. It estimates body fat based on height and weight. Your health care provider can help determine your BMI and help you  achieve or maintain a healthy weight.  For females 85 years of age and older: ? A BMI below 18.5 is considered underweight. ? A BMI of 18.5 to 24.9 is normal. ? A BMI of 25 to 29.9 is considered overweight. ? A BMI of 30 and above is considered obese.  Watch levels of cholesterol and blood lipids  You should start having your blood tested for lipids and cholesterol at 62 years of age, then have this test every 5 years.  You may need to have your cholesterol levels checked more often if: ? Your lipid or cholesterol levels are high. ? You are older than 62 years of age. ? You are at high risk for heart disease.  Cancer screening Lung Cancer  Lung cancer screening is recommended for adults 93-21 years old who are at high risk for lung cancer because of a history of smoking.  A yearly low-dose CT scan of the lungs is recommended for people who: ? Currently smoke. ? Have quit within the past 15 years. ? Have at least a 30-pack-year history of smoking. A pack year is smoking an average of one pack of cigarettes a day for 1 year.  Yearly screening should continue until it has been 15 years since you quit.  Yearly screening should stop if you develop a health problem that would prevent you from having lung cancer treatment.  Breast Cancer  Practice breast self-awareness. This means understanding how your breasts normally appear and feel.  It also means doing regular  breast self-exams. Let your health care provider know about any changes, no matter how small.  If you are in your 20s or 30s, you should have a clinical breast exam (CBE) by a health care provider every 1-3 years as part of a regular health exam.  If you are 85 or older, have a CBE every year. Also consider having a breast X-ray (mammogram) every year.  If you have a family history of breast cancer, talk to your health care provider about genetic screening.  If you are at high risk for breast cancer, talk to your health  care provider about having an MRI and a mammogram every year.  Breast cancer gene (BRCA) assessment is recommended for women who have family members with BRCA-related cancers. BRCA-related cancers include: ? Breast. ? Ovarian. ? Tubal. ? Peritoneal cancers.  Results of the assessment will determine the need for genetic counseling and BRCA1 and BRCA2 testing.  Cervical Cancer Your health care provider may recommend that you be screened regularly for cancer of the pelvic organs (ovaries, uterus, and vagina). This screening involves a pelvic examination, including checking for microscopic changes to the surface of your cervix (Pap test). You may be encouraged to have this screening done every 3 years, beginning at age 2.  For women ages 20-65, health care providers may recommend pelvic exams and Pap testing every 3 years, or they may recommend the Pap and pelvic exam, combined with testing for human papilloma virus (HPV), every 5 years. Some types of HPV increase your risk of cervical cancer. Testing for HPV may also be done on women of any age with unclear Pap test results.  Other health care providers may not recommend any screening for nonpregnant women who are considered low risk for pelvic cancer and who do not have symptoms. Ask your health care provider if a screening pelvic exam is right for you.  If you have had past treatment for cervical cancer or a condition that could lead to cancer, you need Pap tests and screening for cancer for at least 20 years after your treatment. If Pap tests have been discontinued, your risk factors (such as having a new sexual partner) need to be reassessed to determine if screening should resume. Some women have medical problems that increase the chance of getting cervical cancer. In these cases, your health care provider may recommend more frequent screening and Pap tests.  Colorectal Cancer  This type of cancer can be detected and often  prevented.  Routine colorectal cancer screening usually begins at 62 years of age and continues through 62 years of age.  Your health care provider may recommend screening at an earlier age if you have risk factors for colon cancer.  Your health care provider may also recommend using home test kits to check for hidden blood in the stool.  A small camera at the end of a tube can be used to examine your colon directly (sigmoidoscopy or colonoscopy). This is done to check for the earliest forms of colorectal cancer.  Routine screening usually begins at age 80.  Direct examination of the colon should be repeated every 5-10 years through 62 years of age. However, you may need to be screened more often if early forms of precancerous polyps or small growths are found.  Skin Cancer  Check your skin from head to toe regularly.  Tell your health care provider about any new moles or changes in moles, especially if there is a change in a mole's shape  or color.  Also tell your health care provider if you have a mole that is larger than the size of a pencil eraser.  Always use sunscreen. Apply sunscreen liberally and repeatedly throughout the day.  Protect yourself by wearing long sleeves, pants, a wide-brimmed hat, and sunglasses whenever you are outside.  Heart disease, diabetes, and high blood pressure  High blood pressure causes heart disease and increases the risk of stroke. High blood pressure is more likely to develop in: ? People who have blood pressure in the high end of the normal range (130-139/85-89 mm Hg). ? People who are overweight or obese. ? People who are African American.  If you are 37-79 years of age, have your blood pressure checked every 3-5 years. If you are 17 years of age or older, have your blood pressure checked every year. You should have your blood pressure measured twice-once when you are at a hospital or clinic, and once when you are not at a hospital or clinic.  Record the average of the two measurements. To check your blood pressure when you are not at a hospital or clinic, you can use: ? An automated blood pressure machine at a pharmacy. ? A home blood pressure monitor.  If you are between 69 years and 10 years old, ask your health care provider if you should take aspirin to prevent strokes.  Have regular diabetes screenings. This involves taking a blood sample to check your fasting blood sugar level. ? If you are at a normal weight and have a low risk for diabetes, have this test once every three years after 62 years of age. ? If you are overweight and have a high risk for diabetes, consider being tested at a younger age or more often. Preventing infection Hepatitis B  If you have a higher risk for hepatitis B, you should be screened for this virus. You are considered at high risk for hepatitis B if: ? You were born in a country where hepatitis B is common. Ask your health care provider which countries are considered high risk. ? Your parents were born in a high-risk country, and you have not been immunized against hepatitis B (hepatitis B vaccine). ? You have HIV or AIDS. ? You use needles to inject street drugs. ? You live with someone who has hepatitis B. ? You have had sex with someone who has hepatitis B. ? You get hemodialysis treatment. ? You take certain medicines for conditions, including cancer, organ transplantation, and autoimmune conditions.  Hepatitis C  Blood testing is recommended for: ? Everyone born from [birth year] through 1965. ? Anyone with known risk factors for hepatitis C.  Sexually transmitted infections (STIs)  You should be screened for sexually transmitted infections (STIs) including gonorrhea and chlamydia if: ? You are sexually active and are younger than 62 years of age. ? You are older than 62 years of age and your health care provider tells you that you are at risk for this type of infection. ? Your sexual  activity has changed since you were last screened and you are at an increased risk for chlamydia or gonorrhea. Ask your health care provider if you are at risk.  If you do not have HIV, but are at risk, it may be recommended that you take a prescription medicine daily to prevent HIV infection. This is called pre-exposure prophylaxis (PrEP). You are considered at risk if: ? You are sexually active and do not regularly use condoms or know the  HIV status of your partner(s). ? You take drugs by injection. ? You are sexually active with a partner who has HIV.  Talk with your health care provider about whether you are at high risk of being infected with HIV. If you choose to begin PrEP, you should first be tested for HIV. You should then be tested every 3 months for as long as you are taking PrEP. Pregnancy  If you are premenopausal and you may become pregnant, ask your health care provider about preconception counseling.  If you may become pregnant, take 400 to 800 micrograms (mcg) of folic acid every day.  If you want to prevent pregnancy, talk to your health care provider about birth control (contraception). Osteoporosis and menopause  Osteoporosis is a disease in which the bones lose minerals and strength with aging. This can result in serious bone fractures. Your risk for osteoporosis can be identified using a bone density scan.  If you are 79 years of age or older, or if you are at risk for osteoporosis and fractures, ask your health care provider if you should be screened.  Ask your health care provider whether you should take a calcium or vitamin D supplement to lower your risk for osteoporosis.  Menopause may have certain physical symptoms and risks.  Hormone replacement therapy may reduce some of these symptoms and risks. Talk to your health care provider about whether hormone replacement therapy is right for you. Follow these instructions at home:  Schedule regular health, dental,  and eye exams.  Stay current with your immunizations.  Do not use any tobacco products including cigarettes, chewing tobacco, or electronic cigarettes.  If you are pregnant, do not drink alcohol.  If you are breastfeeding, limit how much and how often you drink alcohol.  Limit alcohol intake to no more than 1 drink per day for nonpregnant women. One drink equals 12 ounces of beer, 5 ounces of wine, or 1 ounces of hard liquor.  Do not use street drugs.  Do not share needles.  Ask your health care provider for help if you need support or information about quitting drugs.  Tell your health care provider if you often feel depressed.  Tell your health care provider if you have ever been abused or do not feel safe at home. This information is not intended to replace advice given to you by your health care provider. Make sure you discuss any questions you have with your health care provider. Document Released: 05/02/2011 Document Revised: 03/24/2016 Document Reviewed: 07/21/2015 Elsevier Interactive Patient Education  [employer].

## 2018-03-27 NOTE — Assessment & Plan Note (Signed)
BP reading slightly elevated today, recent course of NSAIDS by ortho provider Continue current medications RTC for Nurse visit for BP check in 2 weeks- consider medication adjustments if BP remains elevated - Comprehensive metabolic panel; Future

## 2018-04-05 ENCOUNTER — Encounter: Payer: Self-pay | Admitting: Gastroenterology

## 2018-04-05 LAB — HIV ANTIBODY (ROUTINE TESTING W REFLEX): HIV 1&2 Ab, 4th Generation: NONREACTIVE

## 2018-04-05 LAB — CULTURE, URINE COMPREHENSIVE

## 2018-04-05 LAB — HEPATITIS C ANTIBODY
HEP C AB: NONREACTIVE
SIGNAL TO CUT-OFF: 0.02 (ref <1.00)

## 2018-04-06 LAB — CULTURE, URINE COMPREHENSIVE
MICRO NUMBER: 90676485
SPECIMEN QUALITY: ADEQUATE

## 2018-04-10 ENCOUNTER — Ambulatory Visit: Payer: BLUE CROSS/BLUE SHIELD

## 2018-04-10 ENCOUNTER — Telehealth: Payer: Self-pay

## 2018-04-10 MED ORDER — LOSARTAN POTASSIUM-HCTZ 100-12.5 MG PO TABS: 1.0000 | ORAL_TABLET | Freq: Every day | ORAL | 1 refills | Status: DC

## 2018-04-10 NOTE — Telephone Encounter (Signed)
Routing to [place]---patient came in for bp recheck today (nurse visit)--today's reading was 158/80---please advise any further instructions for the patient, I will call and let her know, thanks

## 2018-04-10 NOTE — Telephone Encounter (Signed)
I have sent a new prescription for losartan 100-hydrochlorothiazide 12.5 1 tablet daily to her pharmacy (increasing losartan dosage). Have her start this new dosage and stop her old dosage, come back and see me for a follow up visit in about 2-3 weeks. thanks

## 2018-04-11 NOTE — Telephone Encounter (Signed)
Patient advised of Gina Ryan's note/instructions, patient repeated back for understanding, she will stop current dosage, pick up new med and start, and then will return to recheck bp in 2-3 weeks

## 2018-04-19 ENCOUNTER — Ambulatory Visit
Admission: RE | Admit: 2018-04-19 | Discharge: 2018-04-19 | Disposition: A | Discharge status: Home / Self Care | Payer: BLUE CROSS/BLUE SHIELD | Source: Ambulatory Visit | Attending: Nurse Practitioner | Admitting: Nurse Practitioner

## 2018-04-19 DIAGNOSIS — R748 Abnormal levels of other serum enzymes: Secondary | ICD-10-CM

## 2018-04-20 ENCOUNTER — Other Ambulatory Visit: Payer: Self-pay | Admitting: Family

## 2018-04-20 DIAGNOSIS — R748 Abnormal levels of other serum enzymes: Secondary | ICD-10-CM

## 2018-05-02 ENCOUNTER — Ambulatory Visit: Payer: BLUE CROSS/BLUE SHIELD

## 2018-05-02 ENCOUNTER — Telehealth: Payer: Self-pay

## 2018-05-02 DIAGNOSIS — I10 Essential (primary) hypertension: Secondary | ICD-10-CM

## 2018-05-02 NOTE — Telephone Encounter (Signed)
Routing to [place], patient came in today (nurse visit) for bp check after being on bp medication---today's reading was 120/76---she has also started diet and exercise plan with her family---routing to [place], [place].Marland KitchenMarland Kitchen

## 2018-05-02 NOTE — Telephone Encounter (Signed)
Great, we will keep her on this current dosage. Please have her stop by the lab on Friday or next week to have BMET rechecked- since we adjusted her medication dosage we should recheck this, and I am sorry that I did not have this order placed for her to do while here today. Please have her schedule a 3 month follow up visit with me so that I can ensure her BP remains stable on this new med dosage.

## 2018-05-04 NOTE — Telephone Encounter (Signed)
Patient advised of ashleigh's note/instructions, she will prob due labwork on Monday or Tuesday of next week

## 2018-06-01 ENCOUNTER — Other Ambulatory Visit (INDEPENDENT_AMBULATORY_CARE_PROVIDER_SITE_OTHER): Payer: BLUE CROSS/BLUE SHIELD

## 2018-06-01 DIAGNOSIS — I10 Essential (primary) hypertension: Secondary | ICD-10-CM

## 2018-06-01 LAB — BASIC METABOLIC PANEL WITH GFR
BUN: 18 mg/dL (ref 6–23)
CO2: 25 meq/L (ref 19–32)
Calcium: 9 mg/dL (ref 8.4–10.5)
Chloride: 102 meq/L (ref 96–112)
Creatinine, Ser: 0.79 mg/dL (ref 0.40–1.20)
GFR: 94.67 mL/min
Glucose, Bld: 117 mg/dL — ABNORMAL HIGH (ref 70–99)
Potassium: 4.4 meq/L (ref 3.5–5.1)
Sodium: 136 meq/L (ref 135–145)

## 2018-06-04 MED ORDER — LOSARTAN POTASSIUM-HCTZ 100-12.5 MG PO TABS: 1.0000 | ORAL_TABLET | Freq: Every day | ORAL | 1 refills | Status: DC

## 2018-06-11 ENCOUNTER — Ambulatory Visit (AMBULATORY_SURGERY_CENTER): Payer: Self-pay | Admitting: *Deleted

## 2018-06-11 ENCOUNTER — Encounter: Payer: Self-pay | Admitting: Gastroenterology

## 2018-06-11 VITALS — Ht 66.0 in | Wt 285.0 lb

## 2018-06-11 DIAGNOSIS — Z1211 Encounter for screening for malignant neoplasm of colon: Secondary | ICD-10-CM

## 2018-06-11 DIAGNOSIS — Z800 Family history of malignant neoplasm of digestive organs: Secondary | ICD-10-CM

## 2018-06-11 MED ORDER — NA SULFATE-K SULFATE-MG SULF 17.5-3.13-1.6 GM/177ML PO SOLN: 1.0000 | Freq: Once | ORAL | 0 refills | Status: AC

## 2018-06-11 NOTE — Progress Notes (Signed)
Patient denies any allergies to eggs or soy. Patient denies any problems with anesthesia/sedation. Patient denies any oxygen use at home. Patient denies taking any diet/weight loss medications or blood thinners. EMMI education offered, pt declined. No coupon available.

## 2018-06-12 ENCOUNTER — Telehealth: Payer: Self-pay | Admitting: Gastroenterology

## 2018-06-12 NOTE — Telephone Encounter (Signed)
Called pt and left message to call us back regarding her prep questions. Gina Ryan

## 2018-06-12 NOTE — Telephone Encounter (Signed)
Returned patients call regarding her prep. Patient states that Suprep was going to cost 80.00 with her insurance. I offered her a 50.00 coupon and she said that she might get it next week however it would be a hardship because she has limited resources. I offered Gina Ryan a sample of Suprep and she said that she would pick it up today around 1:30 Pm.   Riki Sheer, LPN ( [facility] )

## 2018-06-19 NOTE — Progress Notes (Signed)
Patient ID: Gina Ryan, female   DOB: 02/04/1956, 62 y.o.   MRN: [id number]              Referring PCP: Jodi Mourning  Chief complaint: Abnormal blood test  History of Present Illness:  She was referred by her PCP because of an increased alkaline phosphatase this year which was higher in 5/19 Patient has had a history of cholecystectomy She has not had any known liver problems and her ultrasound of the liver and biliary tract was essentially normal She has not had any other further evaluation done  She has not had any recent fractures, bone pain other than her usual joint pains Also has not been on any new significant medications   Lab Results  Component Value Date   ALT 11 03/27/2018   AST 14 03/27/2018   ALKPHOS 201 (H) 03/27/2018   BILITOT 0.4 03/27/2018   Lab Results  Component Value Date   CALCIUM 9.0 06/01/2018    Past Medical History:  Diagnosis Date  . Atypical chest pain   . Blood transfusion without reported diagnosis   . GERD (gastroesophageal reflux disease)   . Hypertension   . Morbid obesity ([place])   . Osteoarthritis, knee   . Thyroid disease     Past Surgical History:  Procedure Laterality Date  . CHOLECYSTECTOMY    . COSMETIC SURGERY  07/2010   (lower facelift, lower blepharoplasty)  . GASTRIC BYPASS    . LAPAROSCOPIC GASTRIC BANDING    . ROTATOR CUFF REPAIR Bilateral   . TOTAL ABDOMINAL HYSTERECTOMY    . TOTAL KNEE ARTHROPLASTY Left 10/28/2016   Procedure: LEFT TOTAL KNEE ARTHROPLASTY;  Surgeon: Sydnee Cabal, MD;  Location: [facility];  Service: Orthopedics;  Laterality: Left;  . TOTAL KNEE ARTHROPLASTY Right 03/10/2017   Procedure: RIGHT TOTAL KNEE ARTHROPLASTY;  Surgeon: Sydnee Cabal, MD;  Location: [facility];  Service: Orthopedics;  Laterality: Right;    Family History  Problem Relation Age of Onset  . Stroke Mother   . Hypertension Mother   . Cancer Brother        diagnosed in late 19's  . Colon cancer Brother 53  . Prostate cancer  Brother   . Colon cancer Maternal Uncle     Social History:  reports that she has never smoked. She has never used smokeless tobacco. She reports that she does not drink alcohol or use drugs.  Allergies:  Allergies  Allergen Reactions  . Vicodin [Hydrocodone-Acetaminophen] Shortness Of Breath    REACTION: Shortness of Breath   . Hydrocodone-Acetaminophen     REACTION: Shortness of Breath  . Valsartan     Eye redness. No current issues with losartan Eye redness. No current issues with losartan    Allergies as of 06/20/2018      Reactions   Vicodin [hydrocodone-acetaminophen] Shortness Of Breath   REACTION: Shortness of Breath   Hydrocodone-acetaminophen    REACTION: Shortness of Breath   Valsartan    Eye redness. No current issues with losartan Eye redness. No current issues with losartan      Medication List        Accurate as of 06/20/18 12:04 PM. Always use your most recent med list.          ibuprofen 800 MG tablet Commonly known as:  ADVIL,MOTRIN Take 1 tablet by mouth as needed.   levothyroxine 50 MCG tablet Commonly known as:  SYNTHROID, LEVOTHROID TAKE 1 TABLET BY MOUTH ONCE DAILY   losartan-hydrochlorothiazide 100-12.5 MG  tablet Commonly known as:  HYZAAR Take 1 tablet by mouth daily.   methocarbamol 500 MG tablet Commonly known as:  ROBAXIN Take 1 tablet by mouth as needed.   omeprazole 40 MG capsule Commonly known as:  PRILOSEC TAKE 1 CAPSULE BY MOUTH ONCE DAILY   triamcinolone 0.025 % ointment Commonly known as:  KENALOG Apply 1 application topically 2 (two) times daily.       LABS:  No visits with results within 1 Week(s) from this visit.  Latest known visit with results is:  Lab on 06/01/2018  Component Date Value Ref Range Status  . Sodium 06/01/2018 136  135 - 145 mEq/L Final  . Potassium 06/01/2018 4.4  3.5 - 5.1 mEq/L Final  . Chloride 06/01/2018 102  96 - 112 mEq/L Final  . CO2 06/01/2018 25  19 - 32 mEq/L Final  . Glucose,  Bld 06/01/2018 117* 70 - 99 mg/dL Final  . BUN 06/01/2018 18  6 - 23 mg/dL Final  . Creatinine, Ser 06/01/2018 0.79  0.40 - 1.20 mg/dL Final  . Calcium 06/01/2018 9.0  8.4 - 10.5 mg/dL Final  . GFR 06/01/2018 94.67  >60.00 mL/min Final        Review of Systems  Constitutional: Negative for weight loss and reduced appetite.  HENT: Negative for headaches.   Respiratory: Negative for shortness of breath.   Cardiovascular: Negative for leg swelling.  Gastrointestinal: Positive for nausea. Negative for diarrhea.       Has frequent nausea as well as reflux  Endocrine: Positive for fatigue.       She has mostly decreased motivation  Genitourinary: Negative for frequency.  Musculoskeletal: Positive for joint pain and back pain. Negative for muscle aches and muscle cramps.       Has some pain in her hands, reportedly has osteoarthritis.  Taking Motrin occasionally Back pain on and off with occasional sciatica  Skin: Negative for rash and itching.       At times will have a rash on lower legs and was told she has eczema  Neurological: Positive for numbness.       Right leg around the knee joint tends to feel numb since her surgery.  No numbness in her toes  Psychiatric/Behavioral:       Does not think that she has depression    Wt Readings from Last 3 Encounters:  06/20/18 285 lb (129.3 kg)  06/11/18 285 lb (129.3 kg)  03/27/18 285 lb (129.3 kg)     PHYSICAL EXAM:  BP 128/80 (BP Location: Right Arm, Patient Position: Sitting, Cuff Size: Normal)   Pulse 96   Ht 5\' 6"  (1.676 m)   Wt 285 lb (129.3 kg)   SpO2 99%   BMI 46.00 kg/m   GENERAL: She has marked generalized obesity, no cushingoid features  No pallor, clubbing, cervical lymphadenopathy or edema.  Skin:  no rash.  She does have scattered lightly pigmented macules on her lower legs  EYES:  Externally normal.  Fundii: Exam not indicated   ENT: Oral mucosa and tongue normal.  THYROID:  Not palpable.  HEART:  Normal   S1 and S2; no murmur or click.  CHEST:  Normal shape.  Lungs: Vescicular breath sounds heard equally.  No crepitations/ wheeze.  ABDOMEN: Marked obesity present with old scars of surgery No distention.  Liver and spleen not palpable.  No other mass or tenderness.  NEUROLOGICAL: .Reflexes are bilaterally normal at biceps but absent at ankles  JOINTS:  Normal  peripheral joints.  Skeletal: She has no deformity or tenderness of the spine No deformity or tenderness of the tibial bones   ASSESSMENT:    Increased alkaline phosphatase of unclear etiology. Although she has had a normal ALT, AST and abdominal ultrasound she has not had a GGT checked to determine if her alkaline phosphatase is of hepatic origin. Also her last node function studies were 3 months ago and will need to be updated  Currently she does not have any history of bone diseases such is osteoporosis, osteomalacia recent fractures or Paget's disease to suggest an explanation for her high alkaline phosphatase   Obesity: This is chronic and not successful with her previous attempts with gastric bypass and lap band surgeries.  Also has prediabetes with A1c 6.1  History of mild hypothyroidism: Taking only 50 mcg daily and has not had any labs done since 2/19   PLAN:   Check GGT level and if this is abnormal will defer any work-up to gastroenterologist  We will also check alkaline phosphatase isoenzymes  Since she has a history of vitamin D deficiency will check her vitamin D level  Parathyroid hormone will not be checked at this time since calcium has been normal  Consider bone scan if she has alkaline phosphatase of bone origin and no other etiology found  Recheck TSH   Consultation note sent to the referring physician  Elayne Snare 06/20/2018, 12:04 PM

## 2018-06-20 ENCOUNTER — Encounter: Payer: Self-pay | Admitting: Endocrinology

## 2018-06-20 ENCOUNTER — Ambulatory Visit: Payer: BLUE CROSS/BLUE SHIELD | Admitting: Endocrinology

## 2018-06-20 VITALS — BP 128/80 | HR 96 | Ht 66.0 in | Wt 285.0 lb

## 2018-06-20 DIAGNOSIS — R748 Abnormal levels of other serum enzymes: Secondary | ICD-10-CM

## 2018-06-20 DIAGNOSIS — E559 Vitamin D deficiency, unspecified: Secondary | ICD-10-CM | POA: Diagnosis not present

## 2018-06-20 DIAGNOSIS — E039 Hypothyroidism, unspecified: Secondary | ICD-10-CM | POA: Diagnosis not present

## 2018-06-20 LAB — HEPATIC FUNCTION PANEL
ALBUMIN: 3.9 g/dL (ref 3.5–5.2)
ALT: 16 U/L (ref 0–35)
AST: 18 U/L (ref 0–37)
Alkaline Phosphatase: 158 U/L — ABNORMAL HIGH (ref 39–117)
BILIRUBIN TOTAL: 0.3 mg/dL (ref 0.2–1.2)
Bilirubin, Direct: 0.1 mg/dL (ref 0.0–0.3)
TOTAL PROTEIN: 7.4 g/dL (ref 6.0–8.3)

## 2018-06-20 LAB — T4, FREE: Free T4: 0.87 ng/dL (ref 0.60–1.60)

## 2018-06-20 LAB — GAMMA GT: GGT: 12 U/L (ref 7–51)

## 2018-06-20 LAB — TSH: TSH: 0.51 u[IU]/mL (ref 0.35–4.50)

## 2018-06-20 LAB — VITAMIN D 25 HYDROXY (VIT D DEFICIENCY, FRACTURES): VITD: 18.89 ng/mL — ABNORMAL LOW (ref 30.00–100.00)

## 2018-06-25 ENCOUNTER — Encounter: Payer: Self-pay | Admitting: Gastroenterology

## 2018-06-25 ENCOUNTER — Ambulatory Visit (AMBULATORY_SURGERY_CENTER): Payer: BLUE CROSS/BLUE SHIELD | Admitting: Gastroenterology

## 2018-06-25 VITALS — BP 125/66 | HR 60 | Temp 99.1°F | Resp 16 | Ht 66.0 in | Wt 285.0 lb

## 2018-06-25 DIAGNOSIS — D123 Benign neoplasm of transverse colon: Secondary | ICD-10-CM

## 2018-06-25 DIAGNOSIS — Z1211 Encounter for screening for malignant neoplasm of colon: Secondary | ICD-10-CM

## 2018-06-25 DIAGNOSIS — K635 Polyp of colon: Secondary | ICD-10-CM

## 2018-06-25 MED ORDER — SODIUM CHLORIDE 0.9 % IV SOLN: 500.0000 mL | Freq: Once | INTRAVENOUS | Status: DC

## 2018-06-25 NOTE — Patient Instructions (Signed)
Impression/Recommendations:  Polyp handout given to patient. Diverticulosis handout given to patient. Hemorrhoid handout given to patient. High fiber handout given to patient.  Repeat colonoscopy for surveillance.  Date to be determined after pathology results reviewed.  Continue present medication.  Await pathology results.  YOU HAD AN ENDOSCOPIC PROCEDURE TODAY AT [facility]:   Refer to the procedure report that was given to you for any specific questions about what was found during the examination.  If the procedure report does not answer your questions, please call your gastroenterologist to clarify.  If you requested that your care partner not be given the details of your procedure findings, then the procedure report has been included in a sealed envelope for you to review at your convenience later.  YOU SHOULD EXPECT: Some feelings of bloating in the abdomen. Passage of more gas than usual.  Walking can help get rid of the air that was put into your GI tract during the procedure and reduce the bloating. If you had a lower endoscopy (such as a colonoscopy or flexible sigmoidoscopy) you may notice spotting of blood in your stool or on the toilet paper. If you underwent a bowel prep for your procedure, you may not have a normal bowel movement for a few days.  Please Note:  You might notice some irritation and congestion in your nose or some drainage.  This is from the oxygen used during your procedure.  There is no need for concern and it should clear up in a day or so.  SYMPTOMS TO REPORT IMMEDIATELY:   Following lower endoscopy (colonoscopy or flexible sigmoidoscopy):  Excessive amounts of blood in the stool  Significant tenderness or worsening of abdominal pains  Swelling of the abdomen that is new, acute  Fever of 100F or higher For urgent or emergent issues, a gastroenterologist can be reached at any hour by calling [phone].   DIET:  We do recommend a  small meal at first, but then you may proceed to your regular diet.  Drink plenty of fluids but you should avoid alcoholic beverages for 24 hours.  ACTIVITY:  You should plan to take it easy for the rest of today and you should NOT DRIVE or use heavy machinery until tomorrow (because of the sedation medicines used during the test).    FOLLOW UP: Our staff will call the number listed on your records the next business day following your procedure to check on you and address any questions or concerns that you may have regarding the information given to you following your procedure. If we do not reach you, we will leave a message.  However, if you are feeling well and you are not experiencing any problems, there is no need to return our call.  We will assume that you have returned to your regular daily activities without incident.  If any biopsies were taken you will be contacted by phone or by letter within the next 1-3 weeks.  Please call us at [phone] if you have not heard about the biopsies in 3 weeks.    SIGNATURES/CONFIDENTIALITY: You and/or your care partner have signed paperwork which will be entered into your electronic medical record.  These signatures attest to the fact that that the information above on your After Visit Summary has been reviewed and is understood.  Full responsibility of the confidentiality of this discharge information lies with you and/or your care-partner.

## 2018-06-25 NOTE — Progress Notes (Signed)
Pt's states no medical or surgical changes since previsit or office visit. 

## 2018-06-25 NOTE — Progress Notes (Signed)
Called to room to assist during endoscopic procedure.  Patient ID and intended procedure confirmed with present staff. Received instructions for my participation in the procedure from the performing physician.  

## 2018-06-25 NOTE — Op Note (Signed)
[facility] Patient Name: Gina Ryan Procedure Date: 06/25/2018 1:44 PM MRN: [id number] Endoscopist: Ladene Artist , MD Age: 62 Referring MD:  Date of Birth: 1956/07/10 Gender: Female Account #: [id number] Procedure:                Colonoscopy Indications:              Screening for colorectal malignant neoplasm Medicines:                Monitored Anesthesia Care Procedure:                Pre-Anesthesia Assessment:                           - Prior to the procedure, a History and Physical                            was performed, and patient medications and                            allergies were reviewed. The patient's tolerance of                            previous anesthesia was also reviewed. The risks                            and benefits of the procedure and the sedation                            options and risks were discussed with the patient.                            All questions were answered, and informed consent                            was obtained. Prior Anticoagulants: The patient has                            taken no previous anticoagulant or antiplatelet                            agents. ASA Grade Assessment: III - A patient with                            severe systemic disease. After reviewing the risks                            and benefits, the patient was deemed in                            satisfactory condition to undergo the procedure.                           After obtaining informed consent, the colonoscope  was passed under direct vision. Throughout the                            procedure, the patient's blood pressure, pulse, and                            oxygen saturations were monitored continuously. The                            Colonoscope was introduced through the anus and                            advanced to the the cecum, identified by                            appendiceal orifice and  ileocecal valve. The                            ileocecal valve, appendiceal orifice, and rectum                            were photographed. The quality of the bowel                            preparation was excellent. The colonoscopy was                            performed without difficulty. The patient tolerated                            the procedure well. Scope In: 1:48:32 PM Scope Out: 2:00:11 PM Scope Withdrawal Time: 0 hours 9 minutes 35 seconds  Total Procedure Duration: 0 hours 11 minutes 39 seconds  Findings:                 The perianal and digital rectal examinations were                            normal.                           A 5 mm polyp was found in the transverse colon. The                            polyp was sessile. The polyp was removed with a                            cold biopsy forceps. Resection and retrieval were                            complete.                           Many medium-mouthed diverticula were found in the  left colon. There was no evidence of diverticular                            bleeding.                           Internal hemorrhoids were found during                            retroflexion. The hemorrhoids were small and Grade                            I (internal hemorrhoids that do not prolapse).                           The exam was otherwise without abnormality on                            direct and retroflexion views. Complications:            No immediate complications. Estimated blood loss:                            None. Estimated Blood Loss:     Estimated blood loss: none. Impression:               - One 5 mm polyp in the transverse colon, removed                            with a cold biopsy forceps. Resected and retrieved.                           - Moderate diverticulosis in the left colon. There                            was no evidence of diverticular bleeding.                            - Internal hemorrhoids.                           - The examination was otherwise normal on direct                            and retroflexion views. Recommendation:           - Repeat colonoscopy in 5 years for surveillance if                            polyp is precancerous, otherwise 10 years.                           - Patient has a contact number available for                            emergencies. The signs and symptoms of potential  delayed complications were discussed with the                            patient. Return to normal activities tomorrow.                            Written discharge instructions were provided to the                            patient.                           - High fiber diet.                           - Continue present medications.                           - Await pathology results. Ladene Artist, MD 06/25/2018 2:03:28 PM This report has been signed electronically.

## 2018-06-25 NOTE — Progress Notes (Signed)
Report to PACU, RN, vss, BBS= Clear.  

## 2018-06-26 ENCOUNTER — Telehealth: Payer: Self-pay

## 2018-06-26 NOTE — Telephone Encounter (Signed)
  Follow up Call-  Call Shanyla Marconi number 06/25/2018  Post procedure Call Beckem Tomberlin phone  # [phone] cell  Permission to leave phone message Yes  Some recent data might be hidden     Patient questions:  Do you have a fever, pain , or abdominal swelling? No. Pain Score  0 *  Have you tolerated food without any problems? Yes.    Have you been able to return to your normal activities? Yes.    Do you have any questions about your discharge instructions: Diet   No. Medications  No. Follow up visit  No.  Do you have questions or concerns about your Care? No.  Actions: * If pain score is 4 or above: No action needed, pain <4.

## 2018-07-10 ENCOUNTER — Encounter: Payer: Self-pay | Admitting: Gastroenterology

## 2018-07-30 ENCOUNTER — Ambulatory Visit: Payer: BLUE CROSS/BLUE SHIELD | Admitting: Nurse Practitioner

## 2018-08-01 ENCOUNTER — Encounter (HOSPITAL_COMMUNITY): Payer: Self-pay | Admitting: Emergency Medicine

## 2018-08-01 ENCOUNTER — Ambulatory Visit: Payer: Self-pay | Admitting: Nurse Practitioner

## 2018-08-01 ENCOUNTER — Emergency Department (HOSPITAL_COMMUNITY)
Admission: EM | Admit: 2018-08-01 | Discharge: 2018-08-01 | Disposition: A | Discharge status: Home / Self Care | Payer: Medicare Other | Attending: Emergency Medicine | Admitting: Emergency Medicine

## 2018-08-01 ENCOUNTER — Emergency Department (HOSPITAL_COMMUNITY): Payer: Medicare Other

## 2018-08-01 DIAGNOSIS — R0789 Other chest pain: Secondary | ICD-10-CM | POA: Diagnosis not present

## 2018-08-01 DIAGNOSIS — I10 Essential (primary) hypertension: Secondary | ICD-10-CM | POA: Diagnosis not present

## 2018-08-01 DIAGNOSIS — E039 Hypothyroidism, unspecified: Secondary | ICD-10-CM | POA: Diagnosis not present

## 2018-08-01 DIAGNOSIS — Z79899 Other long term (current) drug therapy: Secondary | ICD-10-CM | POA: Insufficient documentation

## 2018-08-01 DIAGNOSIS — Z96653 Presence of artificial knee joint, bilateral: Secondary | ICD-10-CM | POA: Insufficient documentation

## 2018-08-01 DIAGNOSIS — R079 Chest pain, unspecified: Secondary | ICD-10-CM | POA: Diagnosis present

## 2018-08-01 LAB — BASIC METABOLIC PANEL
ANION GAP: 7 (ref 5–15)
BUN: 15 mg/dL (ref 8–23)
CHLORIDE: 106 mmol/L (ref 98–111)
CO2: 26 mmol/L (ref 22–32)
Calcium: 8.8 mg/dL — ABNORMAL LOW (ref 8.9–10.3)
Creatinine, Ser: 0.74 mg/dL (ref 0.44–1.00)
GFR calc Af Amer: >60 mL/min (ref >60)
GFR calc non Af Amer: >60 mL/min (ref >60)
Glucose, Bld: 103 mg/dL — ABNORMAL HIGH (ref 70–99)
POTASSIUM: 3.9 mmol/L (ref 3.5–5.1)
Sodium: 139 mmol/L (ref 135–145)

## 2018-08-01 LAB — I-STAT TROPONIN, ED
TROPONIN I, POC: 0 ng/mL (ref 0.00–0.08)
Troponin i, poc: 0 ng/mL (ref 0.00–0.08)

## 2018-08-01 LAB — CBC
HEMATOCRIT: 40.8 % (ref 36.0–46.0)
HEMOGLOBIN: 12.3 g/dL (ref 12.0–15.0)
MCH: 26.1 pg (ref 26.0–34.0)
MCHC: 30.1 g/dL (ref 30.0–36.0)
MCV: 86.4 fL (ref 78.0–100.0)
Platelets: 281 10*3/uL (ref 150–400)
RBC: 4.72 MIL/uL (ref 3.87–5.11)
RDW: 14.3 % (ref 11.5–15.5)
WBC: 7.7 10*3/uL (ref 4.0–10.5)

## 2018-08-01 MED ORDER — PANTOPRAZOLE SODIUM 40 MG PO TBEC
40.0000 mg | DELAYED_RELEASE_TABLET | Freq: Once | ORAL | Status: AC
  Administered 2018-08-01: 40 mg via ORAL
  Filled 2018-08-01: qty 1

## 2018-08-01 MED ORDER — GI COCKTAIL ~~LOC~~
30.0000 mL | Freq: Once | ORAL | Status: AC
  Administered 2018-08-01: 30 mL via ORAL
  Filled 2018-08-01: qty 30

## 2018-08-01 NOTE — ED Triage Notes (Signed)
Patient to ED c/o burning sensation in her upper chest and throat, sometimes feeling like it goes to her back since last night. She states she thought it was her acid reflux - took her pill last night without relief. Denies dizziness, shortness of breath, N/V. Resp e/u, skin warm/dry.

## 2018-08-01 NOTE — Telephone Encounter (Signed)
Pt c/o moderate chest and throat pressure and tightness that began last night at 9:00 pm. The pain radiates to her back and left shoulder.  Pt wake up this morning and the pain is still present.  Pt stated that the pain comes and goes in "waves." Pt states that it gets worse with exertion. Pt stated she thinks her symptoms are coming from reflux. Pt c/o shortness of breath with exertion that she stated is a new symptom. Pt has a history of high blood pressure, overweight and had a brother that died from a heart attack. Pt c/o headache last night and woke up with headache this am. Pt stated Monday night she was laying on the bed and she stated that her heart was "fluttering" and it took her breath away Monday night. Care advice given and pt instructed to be driven to [facility] ED ASAP. Pt stated she will go.   Reason for Disposition . [1] Intermittent  chest pain or "angina" AND [2] increasing in severity or frequency  (Exception: pains lasting a few seconds) . Pain also present in shoulder(s) or arm(s) or jaw  (Exception: pain is clearly made worse by movement)    Pain to back and left shoulder . Chest pain lasts > 5 minutes (Exceptions: chest pain occurring > 3 days ago and now asymptomatic; same as previously diagnosed heartburn and has accompanying sour taste in mouth)  Answer Assessment - Initial Assessment Questions 1. LOCATION: "Where does it hurt?"       Chest and throat "pressure and tightness" 2. RADIATION: "Does the pain go anywhere else?" (e.g., into neck, jaw, arms, back)     Back, left shoulder 3. ONSET: "When did the chest pain begin?" (Minutes, hours or days)      Last night 9:00 4. PATTERN "Does the pain come and go, or has it been constant since it started?"  "Does it get worse with exertion?"      Comes and goes- last night got worse with exertion 5. DURATION: "How long does it last" (e.g., seconds, minutes, hours)     5 seconds then count to 5 and it comes back 6. SEVERITY:  "How bad is the pain?"  (e.g., Scale 1-10; mild, moderate, or severe)    - MILD (1-3): doesn't interfere with normal activities     - MODERATE (4-7): interferes with normal activities or awakens from sleep    - SEVERE (8-10): excruciating pain, unable to do any normal activities       moderate 7. CARDIAC RISK FACTORS: "Do you have any history of heart problems or risk factors for heart disease?" (e.g., prior heart attack, angina; high blood pressure, diabetes, being overweight, high cholesterol, smoking, or strong family history of heart disease)     High blood pressure, overweight, brother with heart atttack and died 97. PULMONARY RISK FACTORS: "Do you have any history of lung disease?"  (e.g., blood clots in lung, asthma, emphysema, birth control pills)     no 9. CAUSE: "What do you think is causing the chest pain?"     reflux 10. OTHER SYMPTOMS: "Do you have any other symptoms?" (e.g., dizziness, nausea, vomiting, sweating, fever, difficulty breathing, cough)       Short of breath, headache 2 days ago was coughing , laying on bed heart fluttering that took her breath away Monday night 11. PREGNANCY: "Is there any chance you are pregnant?" "When was your last menstrual period?"       n/a  Protocols used:  CHEST PAIN-A-AH

## 2018-08-01 NOTE — Discharge Instructions (Signed)
Gina Ryan,  It was a pleasure taking care of you here in the Ed. When you came in, we were concerned that the chest pain was due to your heart but all the work-up have been negative. The pain is most likely due to a flare up of your GERD. I will advice that you decrease your spicy food intake. Continue with your Prilosec. Also, you can discuss with your primary doctor to get a heart stress test as well.   If you start experiencing worsening chest pain, shortness of breath, dizziness, lightheadedness, please report to the emergency department.   ~Take Care

## 2018-08-01 NOTE — ED Provider Notes (Signed)
[facility]ERGENCY DEPARTMENT Provider Note   CSN: [id number] Arrival date & time: 08/01/18  1056     History   Chief Complaint Chief Complaint  Patient presents with  . Chest Pain    HPI Gina Ryan is a 62 y.o. female with medical history African for hypertension, GERD, hypothyroidism, impaired fasting glucose and obesity here for evaluation of chest pain and burning epigastrium.  She was in her usual state of health until last night when she began experiencing epigastric burning sensation.  On onset, she took her Prilosec with no significant relief and slept through it.  At around 5 AM she woke up with a 4/10 chest pain with radiation to the back and the left arm with no associated nausea, vomiting, diaphoresis, palpitation, dysrhythmia, dizziness, lightheadedness, presyncope, vision abnormalities.  She reports that the pain also traveled to her throat and eats a lot of greasy and spicy food.  She called her PCP this morning to report her symptoms and was asked to report to the emergency department.  While on the phone number her PCP she experienced shortness of breath which she states lasted for about 5 minutes.  She recently took a trip to [place] and was on a train for 8 hours however denies lower extremity swelling, lower extremity pain or history of DVTs.   Past Medical History:  Diagnosis Date  . Atypical chest pain   . Blood transfusion without reported diagnosis   . GERD (gastroesophageal reflux disease)   . Hypertension   . Morbid obesity ([place])   . Osteoarthritis, knee   . Thyroid disease     Patient Active Problem List   Diagnosis Date Noted  . Routine general medical examination at a health care facility 03/27/2018  . Primary osteoarthritis of one knee, right 03/10/2017  . Complication of gastric band procedure 09/20/2016  . History of gastroesophageal reflux (GERD) 09/15/2016  . Elevated parathyroid hormone 03/03/2014  . Hypothyroidism  03/03/2014  . Obesity 01/19/2011  . S/P knee replacement 12/13/2007  . HTN (hypertension) 09/05/2007  . GERD 09/05/2007  . Abnormal glucose 09/05/2007  . OA (osteoarthritis) of knee 08/07/2007  . TOTAL HYSTERECTOMY AND BILATERAL SALPINGOOPHERECTOMY, HX OF 08/07/2007  . CHOLECYSTECTOMY, HX OF 08/07/2007    Past Surgical History:  Procedure Laterality Date  . CHOLECYSTECTOMY    . COSMETIC SURGERY  07/2010   (lower facelift, lower blepharoplasty)  . GASTRIC BYPASS    . LAPAROSCOPIC GASTRIC BANDING    . ROTATOR CUFF REPAIR Bilateral   . TOTAL ABDOMINAL HYSTERECTOMY    . TOTAL KNEE ARTHROPLASTY Left 10/28/2016   Procedure: LEFT TOTAL KNEE ARTHROPLASTY;  Surgeon: Sydnee Cabal, MD;  Location: [facility];  Service: Orthopedics;  Laterality: Left;  . TOTAL KNEE ARTHROPLASTY Right 03/10/2017   Procedure: RIGHT TOTAL KNEE ARTHROPLASTY;  Surgeon: Sydnee Cabal, MD;  Location: [facility];  Service: Orthopedics;  Laterality: Right;     OB History   None      Home Medications    Prior to Admission medications   Medication Sig Start Date End Date Taking? Authorizing Provider  ibuprofen (ADVIL,MOTRIN) 800 MG tablet Take 1 tablet by mouth as needed.    [provider]  levothyroxine (SYNTHROID, LEVOTHROID) 50 MCG tablet TAKE 1 TABLET BY MOUTH ONCE DAILY 02/24/18   Lance Sell, NP  losartan-hydrochlorothiazide (HYZAAR) 100-12.5 MG tablet Take 1 tablet by mouth daily. 06/04/18   Lance Sell, NP  methocarbamol (ROBAXIN) 500 MG tablet Take 1 tablet  by mouth as needed. 06/09/18   [provider]  omeprazole (PRILOSEC) 40 MG capsule TAKE 1 CAPSULE BY MOUTH ONCE DAILY 02/24/18   Lance Sell, NP  triamcinolone (KENALOG) 0.025 % ointment Apply 1 application topically 2 (two) times daily. Patient taking differently: Apply 1 application topically every other day.  02/02/17   Delano Metz, FNP  potassium chloride (K-DUR) 10 MEQ tablet Take 1 tablet (10 mEq total) by  mouth daily. 03/03/14 07/08/14  Shawna Orleans, Doe-Hyun R, DO    Family History Family History  Problem Relation Age of Onset  . Stroke Mother   . Hypertension Mother   . Cancer Brother        diagnosed in late 66's  . Colon cancer Brother 70  . Prostate cancer Brother   . Colon cancer Maternal Uncle   . Esophageal cancer Neg Hx   . Rectal cancer Neg Hx   . Stomach cancer Neg Hx     Social History Social History   Tobacco Use  . Smoking status: Never Smoker  . Smokeless tobacco: Never Used  Substance Use Topics  . Alcohol use: No  . Drug use: No     Allergies   Vicodin [hydrocodone-acetaminophen]; Hydrocodone-acetaminophen; and Valsartan   Review of Systems Review of Systems  Constitutional: Negative.   HENT: Negative.   Eyes: Negative for photophobia and visual disturbance.  Respiratory: Negative.  Negative for apnea (Burning epigastrium ), chest tightness, shortness of breath and wheezing.   Cardiovascular: Negative.   Gastrointestinal: Positive for abdominal pain. Negative for nausea and vomiting.  Neurological: Positive for headaches. Negative for dizziness, syncope, weakness, light-headedness and numbness.  Psychiatric/Behavioral: Negative.      Physical Exam Updated Vital Signs BP (!) 162/74   Pulse 88   Temp 98.4 F (36.9 C) (Oral)   Resp (!) 22   SpO2 100%   Physical Exam  Constitutional: She appears well-developed and well-nourished.  HENT:  Head: Normocephalic and atraumatic.  Cardiovascular: Normal rate, regular rhythm and normal pulses. Exam reveals no gallop, no S3, no S4, no distant heart sounds and no friction rub.  No murmur heard. Pulmonary/Chest: Effort normal and breath sounds normal.  Abdominal: Soft. Bowel sounds are normal. She exhibits no distension. There is no tenderness.  Neurological: She is alert. She is not disoriented.  Skin: Skin is warm.  Psychiatric: She has a normal mood and affect. Her behavior is normal.     ED Treatments /  Results  Labs (all labs ordered are listed, but only abnormal results are displayed) Labs Reviewed  BASIC METABOLIC PANEL - Abnormal; Notable for the following components:      Result Value   Glucose, Bld 103 (*)    Calcium 8.8 (*)    All other components within normal limits  CBC  I-STAT TROPONIN, ED  I-STAT TROPONIN, ED    EKG EKG Interpretation  Date/Time:  Wednesday August 01 2018 11:05:22 EDT Ventricular Rate:  69 PR Interval:  170 QRS Duration: 76 QT Interval:  410 QTC Calculation: 439 R Axis:   5 Text Interpretation:  Normal sinus rhythm Minimal voltage criteria for LVH, may be normal variant Borderline ECG similar to previous Confirmed by Theotis Burrow [phone]) on 08/01/2018 12:39:03 PM   Radiology Dg Chest 2 View  Result Date: 08/01/2018 CLINICAL DATA:  Chest pressure, back pain, throat discomfort EXAM: CHEST - 2 VIEW COMPARISON:  Chest pain FINDINGS: The heart size and mediastinal contours are within normal limits. Both lungs are clear. The  visualized skeletal structures are unremarkable. IMPRESSION: No active cardiopulmonary disease. Electronically Signed   By: Kathreen Devoid   On: 08/01/2018 12:28    Procedures Procedures (including critical care time)  Medications Ordered in ED Medications - No data to display   Initial Impression / Assessment and Plan / ED Course  I have reviewed the triage vital signs and the nursing notes.  Pertinent labs & imaging results that were available during my care of the patient were reviewed by me and considered in my medical decision making (see chart for details).  Gina Ryan is a pleasant 62 year old African-American female with hypertension, GERD and impaired fasting glucose presented with worsening burning epigastric pain and a one-time episode of chest pain with radiation to the back and left arm which lasted for a couple seconds. Pain was non-exertional without diaphoresis, nausea, vomiting. Currently remains asymptomatic  and denies nausea, vomiting, diaphoresis, dysrhythmia, shortness of breath, lower extremity swelling. Heart score of 2.  Her risk factors for coronary syndrome are hypertension and obesity.   I-STAT troponin was negative x2, EKG shows normal sinus rhythm with no ST or T wave abnormalities, CBC is unremarkable, CXR with no cardiopulmonary process.   She was give a GI cocktail and reported immediate relief. Her symptoms are most likely due to an exacerbation of her GERD and I will not pursue workup for pulmonary embolism as she is not hypoxic, tachycardic and given her low pre-test probability. I will advice patient to decrease her intake of spicy food.   I will advice that she follows up with her PCP and discuss the possibility of outpatient cardiac stress test.    Final Clinical Impressions(s) / ED Diagnoses   Final diagnoses:  None    ED Discharge Orders    None       Jean Rosenthal, MD 08/01/18 1552    Little, Wenda Overland, MD 08/04/18 1330

## 2018-08-03 ENCOUNTER — Ambulatory Visit (INDEPENDENT_AMBULATORY_CARE_PROVIDER_SITE_OTHER): Payer: Medicare Other | Admitting: Nurse Practitioner

## 2018-08-03 ENCOUNTER — Encounter: Payer: Self-pay | Admitting: Nurse Practitioner

## 2018-08-03 VITALS — BP 138/90 | HR 72 | Ht 66.0 in | Wt 285.0 lb

## 2018-08-03 DIAGNOSIS — K219 Gastro-esophageal reflux disease without esophagitis: Secondary | ICD-10-CM

## 2018-08-03 DIAGNOSIS — Z23 Encounter for immunization: Secondary | ICD-10-CM | POA: Diagnosis not present

## 2018-08-03 DIAGNOSIS — E039 Hypothyroidism, unspecified: Secondary | ICD-10-CM | POA: Diagnosis not present

## 2018-08-03 DIAGNOSIS — R079 Chest pain, unspecified: Secondary | ICD-10-CM | POA: Diagnosis not present

## 2018-08-03 MED ORDER — LEVOTHYROXINE SODIUM 50 MCG PO TABS: 50.0000 ug | ORAL_TABLET | Freq: Every day | ORAL | 1 refills | Status: DC

## 2018-08-03 MED ORDER — PANTOPRAZOLE SODIUM 40 MG PO TBEC: 40.0000 mg | DELAYED_RELEASE_TABLET | Freq: Every day | ORAL | 1 refills | Status: DC

## 2018-08-03 NOTE — Assessment & Plan Note (Signed)
Stable  Labs are up to date continue current medication - levothyroxine (SYNTHROID, LEVOTHROID) 50 MCG tablet; Take 1 tablet (50 mcg total) by mouth daily.  Dispense: 90 tablet; Refill: 1

## 2018-08-03 NOTE — Progress Notes (Signed)
Gina Ryan is a 62 y.o. female with the following history as recorded in EpicCare:  Patient Active Problem List   Diagnosis Date Noted  . Routine general medical examination at a health care facility 03/27/2018  . Primary osteoarthritis of one knee, right 03/10/2017  . Complication of gastric band procedure 09/20/2016  . History of gastroesophageal reflux (GERD) 09/15/2016  . Elevated parathyroid hormone 03/03/2014  . Hypothyroidism 03/03/2014  . Obesity 01/19/2011  . S/P knee replacement 12/13/2007  . HTN (hypertension) 09/05/2007  . GERD 09/05/2007  . Abnormal glucose 09/05/2007  . OA (osteoarthritis) of knee 08/07/2007  . TOTAL HYSTERECTOMY AND BILATERAL SALPINGOOPHERECTOMY, HX OF 08/07/2007  . CHOLECYSTECTOMY, HX OF 08/07/2007    Current Outpatient Medications  Medication Sig Dispense Refill  . ibuprofen (ADVIL,MOTRIN) 800 MG tablet Take 1 tablet by mouth as needed.    Marland Kitchen levothyroxine (SYNTHROID, LEVOTHROID) 50 MCG tablet Take 1 tablet (50 mcg total) by mouth daily. 90 tablet 1  . losartan-hydrochlorothiazide (HYZAAR) 100-12.5 MG tablet Take 1 tablet by mouth daily. 90 tablet 1  . methocarbamol (ROBAXIN) 500 MG tablet Take 1 tablet by mouth as needed.  1  . triamcinolone (KENALOG) 0.025 % ointment Apply 1 application topically 2 (two) times daily. (Patient taking differently: Apply 1 application topically every other day. ) 30 g 3  . pantoprazole (PROTONIX) 40 MG tablet Take 1 tablet (40 mg total) by mouth daily. 30 tablet 1   No current facility-administered medications for this visit.     Allergies: Vicodin [hydrocodone-acetaminophen]; Hydrocodone-acetaminophen; and Valsartan  Past Medical History:  Diagnosis Date  . Atypical chest pain   . Blood transfusion without reported diagnosis   . GERD (gastroesophageal reflux disease)   . Hypertension   . Morbid obesity ([place])   . Osteoarthritis, knee   . Thyroid disease     Past Surgical History:  Procedure Laterality Date   . CHOLECYSTECTOMY    . COSMETIC SURGERY  07/2010   (lower facelift, lower blepharoplasty)  . GASTRIC BYPASS    . LAPAROSCOPIC GASTRIC BANDING    . ROTATOR CUFF REPAIR Bilateral   . TOTAL ABDOMINAL HYSTERECTOMY    . TOTAL KNEE ARTHROPLASTY Left 10/28/2016   Procedure: LEFT TOTAL KNEE ARTHROPLASTY;  Surgeon: Sydnee Cabal, MD;  Location: [facility];  Service: Orthopedics;  Laterality: Left;  . TOTAL KNEE ARTHROPLASTY Right 03/10/2017   Procedure: RIGHT TOTAL KNEE ARTHROPLASTY;  Surgeon: Sydnee Cabal, MD;  Location: [facility];  Service: Orthopedics;  Laterality: Right;    Family History  Problem Relation Age of Onset  . Stroke Mother   . Hypertension Mother   . Cancer Brother        diagnosed in late 46's  . Colon cancer Brother 6  . Prostate cancer Brother   . Colon cancer Maternal Uncle   . Esophageal cancer Neg Hx   . Rectal cancer Neg Hx   . Stomach cancer Neg Hx     Social History   Tobacco Use  . Smoking status: Never Smoker  . Smokeless tobacco: Never Used  Substance Use Topics  . Alcohol use: No     Subjective:  Gina Ryan is here today for a follow up of chest pain and GERD. She has a history of GERD, maintained on prilosec 40 daily, reports daily medication compliance. She has noted over past few months shes had increasing symptoms of gerd- acid regurgitation, nausea, cough, heartburn- symptoms worse after eating and at night. Her heartburn was so bad earlier this  week that she went to the ED, described her pain as burning and aching in her chest, ED cardiac workup was normal, symptoms were relieved by GI cocktail, and she was discharged home with recommendations to f/u with PCP. Symptoms have continued intermittently since ED visit. She was also recently told by her dentist that there was evidence of uncontrolled GERD on her dental exam. Requesting synthroid refill today. She denies fevers, chills, syncope, confusion, shortness of breath, bowel changes, rectal  bleeding.   Lab Results  Component Value Date   TSH 0.51 06/20/2018    ROS: See HPI  Objective:  Vitals:   08/03/18 1457  BP: 138/90  Pulse: 72  SpO2: 97%  Weight: 285 lb (129.3 kg)  Height: 5\' 6"  (1.676 m)    General: Well developed, well nourished, in no acute distress  Skin : Warm and dry.  Head: Normocephalic and atraumatic  Eyes: Sclera and conjunctiva clear; pupils round and reactive to light; extraocular movements intact  Ears: External normal; canals clear; tympanic membranes normal  Oropharynx: Pink, supple. No suspicious lesions  Neck: Supple without thyromegaly Lungs: Respirations unlabored; clear to auscultation bilaterally without wheeze, rales, rhonchi  CVS exam: normal rate, regular rhythm, normal S1, S2, no murmurs, distal pulses intact  Abdomen: Soft; nontender; rotund; normoactive bowel sounds; no masses or hepatosplenomegaly  Neurologic: Alert and oriented; speech intact; face symmetrical; moves all extremities well; CNII-XII intact without focal deficit    Assessment:  1. Need for influenza vaccination   2. Gastroesophageal reflux disease, esophagitis presence not specified   3. Chest pain, unspecified type   4. Hypothyroidism, unspecified type     Plan:   Reviewed HM: Need for influenza vaccination- Flu Vaccine QUAD 36+ mos IM  Chest pain, unspecified type Could be due to GERD but will refer to cardiology to r/o any cardiac abnormalities - Ambulatory referral to Cardiology   Return in about 2 weeks (around 08/17/2018).  Orders Placed This Encounter  Procedures  . Flu Vaccine QUAD 36+ mos IM  . Ambulatory referral to Cardiology    Referral Priority:   Routine    Referral Type:   Consultation    Referral Reason:   Specialty Services Required    Requested Specialty:   Cardiology    Number of Visits Requested:   1    Requested Prescriptions   Signed Prescriptions Disp Refills  . levothyroxine (SYNTHROID, LEVOTHROID) 50 MCG tablet 90  tablet 1    Sig: Take 1 tablet (50 mcg total) by mouth daily.  . pantoprazole (PROTONIX) 40 MG tablet 30 tablet 1    Sig: Take 1 tablet (40 mg total) by mouth daily.

## 2018-08-03 NOTE — Patient Instructions (Signed)
Stop prilosec, start protonix  Referral to cardiology  Come back to see me in 2 weeks   Food Choices for Gastroesophageal Reflux Disease, Adult When you have gastroesophageal reflux disease (GERD), the foods you eat and your eating habits are very important. Choosing the right foods can help ease your discomfort. What guidelines do I need to follow?  Choose fruits, vegetables, whole grains, and low-fat dairy products.  Choose low-fat meat, fish, and poultry.  Limit fats such as oils, salad dressings, butter, nuts, and avocado.  Keep a food diary. This helps you identify foods that cause symptoms.  Avoid foods that cause symptoms. These may be different for everyone.  Eat small meals often instead of 3 large meals a day.  Eat your meals slowly, in a place where you are relaxed.  Limit fried foods.  Cook foods using methods other than frying.  Avoid drinking alcohol.  Avoid drinking large amounts of liquids with your meals.  Avoid bending over or lying down until 2-3 hours after eating. What foods are not recommended? These are some foods and drinks that may make your symptoms worse: Vegetables Tomatoes. Tomato juice. Tomato and spaghetti sauce. Chili peppers. Onion and garlic. Horseradish. Fruits Oranges, grapefruit, and lemon (fruit and juice). Meats High-fat meats, fish, and poultry. This includes hot dogs, ribs, ham, sausage, salami, and bacon. Dairy Whole milk and chocolate milk. Sour cream. Cream. Butter. Ice cream. Cream cheese. Drinks Coffee and tea. Bubbly (carbonated) drinks or energy drinks. Condiments Hot sauce. Barbecue sauce. Sweets/Desserts Chocolate and cocoa. Donuts. Peppermint and spearmint. Fats and Oils High-fat foods. This includes [place] fries and potato chips. Other Vinegar. Strong spices. This includes black pepper, white pepper, red pepper, cayenne, curry powder, cloves, ginger, and chili powder. The items listed above may not be a complete  list of foods and drinks to avoid. Contact your dietitian for more information. This information is not intended to replace advice given to you by your health care provider. Make sure you discuss any questions you have with your health care provider. Document Released: 04/17/2012 Document Revised: 03/24/2016 Document Reviewed: 08/21/2013 Elsevier Interactive Patient Education  2017 [employer].

## 2018-08-03 NOTE — Assessment & Plan Note (Signed)
Will try to switch PPI to protonix RTC in 2 weeks for F/U If she is still experiencing symptoms will refer to GI for further evaluation Additional education provided on AVS - pantoprazole (PROTONIX) 40 MG tablet; Take 1 tablet (40 mg total) by mouth daily.  Dispense: 30 tablet; Refill: 1

## 2018-08-14 ENCOUNTER — Ambulatory Visit (INDEPENDENT_AMBULATORY_CARE_PROVIDER_SITE_OTHER): Payer: Medicare Other | Admitting: Internal Medicine

## 2018-08-14 ENCOUNTER — Encounter: Payer: Self-pay | Admitting: Internal Medicine

## 2018-08-14 VITALS — BP 120/60 | HR 78 | Ht 66.0 in | Wt 284.6 lb

## 2018-08-14 DIAGNOSIS — K219 Gastro-esophageal reflux disease without esophagitis: Secondary | ICD-10-CM

## 2018-08-14 DIAGNOSIS — R0789 Other chest pain: Secondary | ICD-10-CM

## 2018-08-14 NOTE — Patient Instructions (Signed)
Medication Instructions:  Continue current medications If you need a refill on your cardiac medications before your next appointment, please call your pharmacy.   Follow-Up: as needed

## 2018-08-14 NOTE — Progress Notes (Signed)
OFFICE CONSULT NOTE  Chief Complaint:  Chest pain  Primary Care Physician: Lance Sell, NP  HPI:  Gina Ryan is a 62 y.o. female who is being seen today for the evaluation of chest pain at the request of Lance Sell, NP.  Iyannah is a pleasant 62 year old female who recently was seen in the ER on October 2 for atypical chest pain.  She has a history of reflux, hypertension, morbid obesity, osteoarthritis and hypothyroidism.  She occasionally has palpitations however attributed that to her thyroid.  Recently she has had worsening reflux symptoms.  With her knee problems she gained about 40 pounds.  She was previously on Prilosec and noted worsening burning in her throat as well as acid actually coming out of her nose.  She was seen in the ER and given a GI cocktail which improved her symptoms very quickly.  Subsequently in follow-up she was switched to Protonix and she is done better with this.  She has no significant family history of heart disease.  Her mother did die of complications of stroke related to hypertension.  EKG was personally reviewed and showed no ischemic changes.  Troponins were negative.  She does report her symptoms have improved since she was seen in the ER.  PMHx:  Past Medical History:  Diagnosis Date  . Atypical chest pain   . Blood transfusion without reported diagnosis   . GERD (gastroesophageal reflux disease)   . Hypertension   . Morbid obesity ([place])   . Osteoarthritis, knee   . Thyroid disease     Past Surgical History:  Procedure Laterality Date  . CHOLECYSTECTOMY    . COSMETIC SURGERY  07/2010   (lower facelift, lower blepharoplasty)  . GASTRIC BYPASS    . LAPAROSCOPIC GASTRIC BANDING    . ROTATOR CUFF REPAIR Bilateral   . TOTAL ABDOMINAL HYSTERECTOMY    . TOTAL KNEE ARTHROPLASTY Left 10/28/2016   Procedure: LEFT TOTAL KNEE ARTHROPLASTY;  Surgeon: Sydnee Cabal, MD;  Location: [facility];  Service: Orthopedics;  Laterality: Left;  .  TOTAL KNEE ARTHROPLASTY Right 03/10/2017   Procedure: RIGHT TOTAL KNEE ARTHROPLASTY;  Surgeon: Sydnee Cabal, MD;  Location: [facility];  Service: Orthopedics;  Laterality: Right;    FAMHx:  Family History  Problem Relation Age of Onset  . Stroke Mother   . Hypertension Mother   . Cancer Brother        diagnosed in late 1's  . Colon cancer Brother 11  . Prostate cancer Brother   . Colon cancer Maternal Uncle   . Esophageal cancer Neg Hx   . Rectal cancer Neg Hx   . Stomach cancer Neg Hx     SOCHx:   reports that she has never smoked. She has never used smokeless tobacco. She reports that she does not drink alcohol or use drugs.  ALLERGIES:  Allergies  Allergen Reactions  . Vicodin [Hydrocodone-Acetaminophen] Shortness Of Breath    REACTION: Shortness of Breath   . Hydrocodone-Acetaminophen     REACTION: Shortness of Breath  . Valsartan     Eye redness. No current issues with losartan Eye redness. No current issues with losartan    ROS: Pertinent items noted in HPI and remainder of comprehensive ROS otherwise negative.  HOME MEDS: Current Outpatient Medications on File Prior to Visit  Medication Sig Dispense Refill  . levothyroxine (SYNTHROID, LEVOTHROID) 50 MCG tablet Take 1 tablet (50 mcg total) by mouth daily. 90 tablet 1  . losartan-hydrochlorothiazide (HYZAAR) 100-12.5  MG tablet Take 1 tablet by mouth daily. 90 tablet 1  . pantoprazole (PROTONIX) 40 MG tablet Take 1 tablet (40 mg total) by mouth daily. 30 tablet 1  . triamcinolone (KENALOG) 0.025 % ointment Apply 1 application topically 2 (two) times daily. (Patient taking differently: Apply 1 application topically every other day. ) 30 g 3  . ibuprofen (ADVIL,MOTRIN) 800 MG tablet Take 1 tablet by mouth as needed.    . methocarbamol (ROBAXIN) 500 MG tablet Take 1 tablet by mouth as needed.  1  . [DISCONTINUED] potassium chloride (K-DUR) 10 MEQ tablet Take 1 tablet (10 mEq total) by mouth daily. 90 tablet 0   No  current facility-administered medications on file prior to visit.     LABS/IMAGING: No results found for this or any previous visit (from the past 48 hour(s)). No results found.  LIPID PANEL:    Component Value Date/Time   CHOL 224 (H) 03/27/2018 1120   TRIG 59.0 03/27/2018 1120   HDL 99.80 03/27/2018 1120   CHOLHDL 2 03/27/2018 1120   VLDL 11.8 03/27/2018 1120   LDLCALC 112 (H) 03/27/2018 1120   LDLDIRECT 99.0 12/15/2008 1612    WEIGHTS: Wt Readings from Last 3 Encounters:  08/14/18 284 lb 9.6 oz (129.1 kg)  08/03/18 285 lb (129.3 kg)  06/25/18 285 lb (129.3 kg)    VITALS: BP 120/60   Pulse 78   Ht 5\' 6"  (1.676 m)   Wt 284 lb 9.6 oz (129.1 kg)   SpO2 99%   BMI 45.94 kg/m   EXAM: General appearance: alert, no distress and morbidly obese Neck: no carotid bruit, no JVD and thyroid not enlarged, symmetric, no tenderness/mass/nodules Lungs: clear to auscultation bilaterally Heart: regular rate and rhythm Abdomen: soft, non-tender; bowel sounds normal; no masses,  no organomegaly Extremities: extremities normal, atraumatic, no cyanosis or edema Pulses: 2+ and symmetric Skin: Skin color, texture, turgor normal. No rashes or lesions Neurologic: Grossly normal Psych: Pleasant  EKG: Deferred  ASSESSMENT: 1. Atypical chest pain 2. GERD  PLAN: 1.   Ms. Ottley is describing atypical chest discomfort which is likely GERD.  This was relieved by GI cocktail and improved since starting on Protonix.  She occasionally gets palpitations however does drink a significant amount of caffeine and advised her to try to back on that which should also improve her reflux symptoms.  She is encouraged to work on more physical activity and weight loss.  No further cardiac work-up is recommended.  She can follow-up with me as needed.  Thanks again for the kind referral.  Pixie Casino, MD, FACC, [place] Director of the [facility] Diplomate of the American Board of Clinical Lipidology Attending Cardiologist  Direct Dial: [phone]  Fax: [phone]  Website:  [email or web address] 08/14/2018, 3:05 PM

## 2018-08-17 ENCOUNTER — Encounter: Payer: Self-pay | Admitting: Nurse Practitioner

## 2018-08-17 ENCOUNTER — Ambulatory Visit (INDEPENDENT_AMBULATORY_CARE_PROVIDER_SITE_OTHER): Payer: Medicare Other | Admitting: Nurse Practitioner

## 2018-08-17 VITALS — BP 132/70 | HR 64 | Temp 98.8°F | Wt 285.4 lb

## 2018-08-17 DIAGNOSIS — K219 Gastro-esophageal reflux disease without esophagitis: Secondary | ICD-10-CM

## 2018-08-17 DIAGNOSIS — L309 Dermatitis, unspecified: Secondary | ICD-10-CM | POA: Insufficient documentation

## 2018-08-17 MED ORDER — TRIAMCINOLONE ACETONIDE 0.025 % EX OINT: 1.0000 "application " | TOPICAL_OINTMENT | Freq: Two times a day (BID) | CUTANEOUS | 1 refills | Status: DC

## 2018-08-17 NOTE — Assessment & Plan Note (Signed)
Stable Continue Protonix at current dosage F/U for new, worsening, recurrent symptoms

## 2018-08-17 NOTE — Assessment & Plan Note (Signed)
Stable Continue kenalog prn F/u for new, worsening symptoms - triamcinolone (KENALOG) 0.025 % ointment; Apply 1 application topically 2 (two) times daily.  Dispense: 30 g; Refill: 1

## 2018-08-17 NOTE — Patient Instructions (Addendum)
I will plan to see you back after 5/28 for annual physical, or sooner if needed.

## 2018-08-17 NOTE — Progress Notes (Signed)
Gina Ryan is a 62 y.o. female with the following history as recorded in EpicCare:  Patient Active Problem List   Diagnosis Date Noted  . Routine general medical examination at a health care facility 03/27/2018  . Primary osteoarthritis of one knee, right 03/10/2017  . Complication of gastric band procedure 09/20/2016  . History of gastroesophageal reflux (GERD) 09/15/2016  . Elevated parathyroid hormone 03/03/2014  . Hypothyroidism 03/03/2014  . Obesity 01/19/2011  . S/P knee replacement 12/13/2007  . HTN (hypertension) 09/05/2007  . Gastroesophageal reflux disease 09/05/2007  . Atypical chest pain 09/05/2007  . Abnormal glucose 09/05/2007  . OA (osteoarthritis) of knee 08/07/2007  . TOTAL HYSTERECTOMY AND BILATERAL SALPINGOOPHERECTOMY, HX OF 08/07/2007  . CHOLECYSTECTOMY, HX OF 08/07/2007    Current Outpatient Medications  Medication Sig Dispense Refill  . ibuprofen (ADVIL,MOTRIN) 800 MG tablet Take 1 tablet by mouth as needed.    Marland Kitchen levothyroxine (SYNTHROID, LEVOTHROID) 50 MCG tablet Take 1 tablet (50 mcg total) by mouth daily. 90 tablet 1  . losartan-hydrochlorothiazide (HYZAAR) 100-12.5 MG tablet Take 1 tablet by mouth daily. 90 tablet 1  . pantoprazole (PROTONIX) 40 MG tablet Take 1 tablet (40 mg total) by mouth daily. 30 tablet 1  . triamcinolone (KENALOG) 0.025 % ointment Apply 1 application topically 2 (two) times daily. 30 g 1   No current facility-administered medications for this visit.     Allergies: Vicodin [hydrocodone-acetaminophen]; Hydrocodone-acetaminophen; and Valsartan  Past Medical History:  Diagnosis Date  . Atypical chest pain   . Blood transfusion without reported diagnosis   . GERD (gastroesophageal reflux disease)   . Hypertension   . Morbid obesity ([place])   . Osteoarthritis, knee   . Thyroid disease     Past Surgical History:  Procedure Laterality Date  . CHOLECYSTECTOMY    . COSMETIC SURGERY  07/2010   (lower facelift, lower blepharoplasty)   . GASTRIC BYPASS    . LAPAROSCOPIC GASTRIC BANDING    . ROTATOR CUFF REPAIR Bilateral   . TOTAL ABDOMINAL HYSTERECTOMY    . TOTAL KNEE ARTHROPLASTY Left 10/28/2016   Procedure: LEFT TOTAL KNEE ARTHROPLASTY;  Surgeon: Sydnee Cabal, MD;  Location: [facility];  Service: Orthopedics;  Laterality: Left;  . TOTAL KNEE ARTHROPLASTY Right 03/10/2017   Procedure: RIGHT TOTAL KNEE ARTHROPLASTY;  Surgeon: Sydnee Cabal, MD;  Location: [facility];  Service: Orthopedics;  Laterality: Right;    Family History  Problem Relation Age of Onset  . Stroke Mother   . Hypertension Mother   . Cancer Brother        diagnosed in late 66's  . Colon cancer Brother 55  . Prostate cancer Brother   . Colon cancer Maternal Uncle   . Esophageal cancer Neg Hx   . Rectal cancer Neg Hx   . Stomach cancer Neg Hx     Social History   Tobacco Use  . Smoking status: Never Smoker  . Smokeless tobacco: Never Used  Substance Use Topics  . Alcohol use: No     Subjective:  Gina Ryan is here today for follow up of GERD. I saw her on 08/03/18 for an ED follow up of atypical chest pain, she told me shed had increasingly worsening acid regurgitation, nausea, cough, heartburn, chest pain, especially after eating and at night, for the past several months. A referral to cardiology was placed to rule out cardiac abnormality, and we switched her PPI from prilosec to protonix 40 daily to see if this relieved her heartburn symptoms. She  is back today for follow up, tells me she has switched from prilosec to protonix as instructed, taking protonix q am, and all of her symptoms are gone, she has not had anymore acid reflux, heartburn, nausea, or chest pain, also saw Cardiology for evaluation on 10/15 with no further workup ordered, only instructed to F/u as needed. She also tells me she has stopped eating before bed, in fact she tells me she had actually been lying in bed eating junk food prior to her recent symptoms of gerd. She is feeling  well today. She denies fevers, chills, syncope, confusion, shortness of breath, bowel changes, rectal bleeding. She would also like a refill of triamcinolone today for eczema, reports triamcinolone prn with good control of her eczema symptoms  ROS-  Objective:  Vitals:   08/17/18 1412  BP: 132/70  Pulse: 64  Temp: 98.8 F (37.1 C)  TempSrc: Oral  Weight: 285 lb 6.4 oz (129.5 kg)    General: Well developed, well nourished, in no acute distress  Skin : Warm and dry.  Head: Normocephalic and atraumatic  Eyes: Sclera and conjunctiva clear; pupils round and reactive to light; extraocular movements intact  Oropharynx: Pink, supple.  Neck: Supple  Lungs: Respirations unlabored; clear to auscultation bilaterally without wheeze, rales, rhonchi  CVS exam: normal rate, regular rhythm, normal S1, S2, no murmurs, rubs, clicks or gallops.   Extremities: No edema, cyanosis Vessels: Symmetric bilaterally  Neurologic: Alert and oriented; speech intact; face symmetrical; moves all extremities well; CNII-XII intact without focal deficit   Assessment:  1. Eczema, unspecified type     Plan:   Return for CPE.  No orders of the defined types were placed in this encounter.   Requested Prescriptions   Signed Prescriptions Disp Refills  . triamcinolone (KENALOG) 0.025 % ointment 30 g 1    Sig: Apply 1 application topically 2 (two) times daily.

## 2018-08-20 ENCOUNTER — Other Ambulatory Visit: Payer: BLUE CROSS/BLUE SHIELD

## 2018-08-29 ENCOUNTER — Encounter: Payer: Self-pay | Admitting: Nurse Practitioner

## 2018-08-29 ENCOUNTER — Other Ambulatory Visit: Payer: Self-pay | Admitting: Family

## 2018-08-29 ENCOUNTER — Telehealth: Payer: Self-pay | Admitting: Nurse Practitioner

## 2018-08-29 MED ORDER — TOBRAMYCIN 0.3 % OP SOLN: OPHTHALMIC | 0 refills | Status: DC

## 2018-08-29 NOTE — Telephone Encounter (Signed)
Copied from [place] [id number]. Topic: Quick Communication - See Telephone Encounter >> Aug 29, 2018  1:59 PM Gardiner Ramus wrote: CRM for notification. See Telephone encounter for: 08/29/18. Pt called and stated that has pink eye and would like something called in. Please advise

## 2018-08-29 NOTE — Telephone Encounter (Signed)
Patient scheduled for appt.

## 2018-08-30 ENCOUNTER — Ambulatory Visit: Payer: Medicare Other | Admitting: Family

## 2018-08-30 DIAGNOSIS — Z0289 Encounter for other administrative examinations: Secondary | ICD-10-CM

## 2018-09-14 DIAGNOSIS — M1812 Unilateral primary osteoarthritis of first carpometacarpal joint, left hand: Secondary | ICD-10-CM | POA: Insufficient documentation

## 2018-09-20 ENCOUNTER — Other Ambulatory Visit: Payer: Self-pay | Admitting: Nurse Practitioner

## 2018-09-20 DIAGNOSIS — K219 Gastro-esophageal reflux disease without esophagitis: Secondary | ICD-10-CM

## 2018-10-08 ENCOUNTER — Other Ambulatory Visit: Payer: Self-pay | Admitting: *Deleted

## 2018-10-08 DIAGNOSIS — E039 Hypothyroidism, unspecified: Secondary | ICD-10-CM

## 2018-10-08 DIAGNOSIS — L309 Dermatitis, unspecified: Secondary | ICD-10-CM

## 2018-10-08 DIAGNOSIS — K219 Gastro-esophageal reflux disease without esophagitis: Secondary | ICD-10-CM

## 2018-10-08 MED ORDER — LEVOTHYROXINE SODIUM 50 MCG PO TABS: 50.0000 ug | ORAL_TABLET | Freq: Every day | ORAL | 1 refills | Status: DC

## 2018-10-08 MED ORDER — IBUPROFEN 800 MG PO TABS: 800.0000 mg | ORAL_TABLET | ORAL | 0 refills | Status: DC | PRN

## 2018-10-08 MED ORDER — TRIAMCINOLONE ACETONIDE 0.025 % EX OINT: 1.0000 "application " | TOPICAL_OINTMENT | Freq: Two times a day (BID) | CUTANEOUS | 1 refills | Status: DC

## 2018-10-08 MED ORDER — LOSARTAN POTASSIUM-HCTZ 100-12.5 MG PO TABS: 1.0000 | ORAL_TABLET | Freq: Every day | ORAL | 1 refills | Status: DC

## 2018-10-08 MED ORDER — PANTOPRAZOLE SODIUM 40 MG PO TBEC: DELAYED_RELEASE_TABLET | ORAL | 1 refills | Status: DC

## 2018-10-10 ENCOUNTER — Other Ambulatory Visit: Payer: Self-pay | Admitting: *Deleted

## 2018-10-10 NOTE — Telephone Encounter (Signed)
Refill request for Methocarbamol. This is not active on her list and has never been prescribed by you. Ok to Rf?

## 2018-10-11 ENCOUNTER — Telehealth: Payer: Self-pay | Admitting: Nurse Practitioner

## 2018-10-11 NOTE — Telephone Encounter (Signed)
Copied from [place] [id number]. Topic: Quick Communication - Rx Refill/Question >> Oct 11, 2018  5:08 PM Waylan Rocher, [place] L wrote: Medication: ibuprofen 800 mg (needs a verbal) ZSM#[id number]  Has the patient contacted their pharmacy? Yes.   (Agent: If no, request that the patient contact the pharmacy for the refill.) (Agent: If yes, when and what did the pharmacy advise?)  Preferred Pharmacy (with phone number or street name): [facility]  [phone]  Agent: Please be advised that RX refills may take up to 3 business days. We ask that you follow-up with your pharmacy.

## 2018-10-12 MED ORDER — METHOCARBAMOL 500 MG PO TABS: 500.0000 mg | ORAL_TABLET | Freq: Three times a day (TID) | ORAL | 0 refills | Status: DC | PRN

## 2018-10-12 NOTE — Telephone Encounter (Signed)
[facility] needs a clarification on the frequency of the Ibuprofen.

## 2018-10-12 NOTE — Telephone Encounter (Signed)
I called [facility] and advised Per PCP sig for Ibuprofen 800 mg  is 1 po q 8 hours prn.

## 2018-10-15 ENCOUNTER — Other Ambulatory Visit: Payer: Self-pay | Admitting: Endocrinology

## 2018-10-15 DIAGNOSIS — E039 Hypothyroidism, unspecified: Secondary | ICD-10-CM

## 2018-10-16 ENCOUNTER — Other Ambulatory Visit: Payer: Medicare Other

## 2018-10-16 ENCOUNTER — Other Ambulatory Visit (INDEPENDENT_AMBULATORY_CARE_PROVIDER_SITE_OTHER): Payer: Medicare Other

## 2018-10-16 DIAGNOSIS — E039 Hypothyroidism, unspecified: Secondary | ICD-10-CM | POA: Diagnosis not present

## 2018-10-16 LAB — COMPREHENSIVE METABOLIC PANEL
ALK PHOS: 139 U/L — AB (ref 39–117)
ALT: 12 U/L (ref 0–35)
AST: 17 U/L (ref 0–37)
Albumin: 4 g/dL (ref 3.5–5.2)
BUN: 19 mg/dL (ref 6–23)
CO2: 25 mEq/L (ref 19–32)
CREATININE: 0.69 mg/dL (ref 0.40–1.20)
Calcium: 9.3 mg/dL (ref 8.4–10.5)
Chloride: 104 mEq/L (ref 96–112)
GFR: 110.54 mL/min (ref >60.00)
GLUCOSE: 97 mg/dL (ref 70–99)
Potassium: 4 mEq/L (ref 3.5–5.1)
Sodium: 140 mEq/L (ref 135–145)
TOTAL PROTEIN: 7.4 g/dL (ref 6.0–8.3)
Total Bilirubin: 0.4 mg/dL (ref 0.2–1.2)

## 2018-10-16 LAB — TSH: TSH: 0.63 u[IU]/mL (ref 0.35–4.50)

## 2018-10-16 LAB — VITAMIN D 25 HYDROXY (VIT D DEFICIENCY, FRACTURES): VITD: 28.37 ng/mL — ABNORMAL LOW (ref 30.00–100.00)

## 2018-10-19 ENCOUNTER — Encounter: Payer: Self-pay | Admitting: Endocrinology

## 2018-10-19 ENCOUNTER — Ambulatory Visit (INDEPENDENT_AMBULATORY_CARE_PROVIDER_SITE_OTHER): Payer: Medicare Other | Admitting: Endocrinology

## 2018-10-19 VITALS — BP 120/80 | HR 66 | Ht 66.0 in | Wt 289.4 lb

## 2018-10-19 DIAGNOSIS — E559 Vitamin D deficiency, unspecified: Secondary | ICD-10-CM | POA: Diagnosis not present

## 2018-10-19 DIAGNOSIS — R748 Abnormal levels of other serum enzymes: Secondary | ICD-10-CM | POA: Diagnosis not present

## 2018-10-19 DIAGNOSIS — E039 Hypothyroidism, unspecified: Secondary | ICD-10-CM

## 2018-10-19 NOTE — Patient Instructions (Signed)
Vitamin D take 2 on Weekends and 1 other days  Take with [employer]

## 2018-10-19 NOTE — Progress Notes (Signed)
Patient ID: Gina Ryan, female   DOB: Aug 05, 1956, 62 y.o.   MRN: [id number]              Referring PCP: Jodi Mourning  Chief complaint: Follow-up  History of Present Illness:  She was referred by her PCP because of an increased alkaline phosphatase which was higher in 5/19 Patient has had a history of cholecystectomy She has not had any known liver problems and her ultrasound of the liver and biliary tract was essentially normal On her initial evaluation her GGT was normal She has not had any recent fractures, bone pain other than her usual joint pains  She has been found to have persistent vitamin D deficiency and has been taking vitamin D3, 5000 units daily since her initial consultation in 8/19 However she takes this at bedtime and not with food Her level is improved but not back to normal  Her calcium is back to normal Also alkaline phosphatase is improving but not quite normal yet, highest level was 201  She is not having any bone pain, weakness or unusual fatigue  Lab Results  Component Value Date   VD25OH 28.37 (L) 10/16/2018   VD25OH 18.89 (L) 06/20/2018   VD25OH 28.94 (L) 10/17/2016   VD25OH 21.94 (L) 07/07/2016   VD25OH 19.31 (L) 06/03/2015      Lab Results  Component Value Date   ALT 12 10/16/2018   AST 17 10/16/2018   ALKPHOS 139 (H) 10/16/2018   BILITOT 0.4 10/16/2018   Lab Results  Component Value Date   CALCIUM 9.3 10/16/2018    Past Medical History:  Diagnosis Date  . Atypical chest pain   . Blood transfusion without reported diagnosis   . GERD (gastroesophageal reflux disease)   . Hypertension   . Morbid obesity ([place])   . Osteoarthritis, knee   . Thyroid disease     Past Surgical History:  Procedure Laterality Date  . CHOLECYSTECTOMY    . COSMETIC SURGERY  07/2010   (lower facelift, lower blepharoplasty)  . GASTRIC BYPASS    . LAPAROSCOPIC GASTRIC BANDING    . ROTATOR CUFF REPAIR Bilateral   . TOTAL ABDOMINAL HYSTERECTOMY    .  TOTAL KNEE ARTHROPLASTY Left 10/28/2016   Procedure: LEFT TOTAL KNEE ARTHROPLASTY;  Surgeon: Sydnee Cabal, MD;  Location: [facility];  Service: Orthopedics;  Laterality: Left;  . TOTAL KNEE ARTHROPLASTY Right 03/10/2017   Procedure: RIGHT TOTAL KNEE ARTHROPLASTY;  Surgeon: Sydnee Cabal, MD;  [facility];  Service: Orthopedics;  Laterality: Right;    Family History  Problem Relation Age of Onset  . Stroke Mother   . Hypertension Mother   . Cancer Brother        diagnosed in late 48's  . Colon cancer Brother 32  . Prostate cancer Brother   . Colon cancer Maternal Uncle   . Esophageal cancer Neg Hx   . Rectal cancer Neg Hx   . Stomach cancer Neg Hx     Social History:  reports that she has never smoked. She has never used smokeless tobacco. She reports that she does not drink alcohol or use drugs.  Allergies:  Allergies  Allergen Reactions  . Vicodin [Hydrocodone-Acetaminophen] Shortness Of Breath    REACTION: Shortness of Breath   . Hydrocodone-Acetaminophen     REACTION: Shortness of Breath  . Valsartan     Eye redness. No current issues with losartan Eye redness. No current issues with losartan    Allergies as of 10/19/2018  Reactions   Vicodin [hydrocodone-acetaminophen] Shortness Of Breath   REACTION: Shortness of Breath   Hydrocodone-acetaminophen    REACTION: Shortness of Breath   Valsartan    Eye redness. No current issues with losartan Eye redness. No current issues with losartan      Medication List       Accurate as of October 19, 2018 11:59 PM. Always use your most recent med list.        ibuprofen 800 MG tablet Commonly known as:  ADVIL,MOTRIN Take 1 tablet (800 mg total) by mouth as needed.   levothyroxine 50 MCG tablet Commonly known as:  SYNTHROID, LEVOTHROID Take 1 tablet (50 mcg total) by mouth daily.   losartan-hydrochlorothiazide 100-12.5 MG tablet Commonly known as:  HYZAAR Take 1 tablet by mouth daily.   methocarbamol 500  MG tablet Commonly known as:  ROBAXIN Take 1 tablet (500 mg total) by mouth every 8 (eight) hours as needed for muscle spasms.   pantoprazole 40 MG tablet Commonly known as:  PROTONIX TAKE 1 TABLET(40 MG) BY MOUTH DAILY   tobramycin 0.3 % ophthalmic solution Commonly known as:  TOBREX 1 gtt to affected eye every 6 hours   triamcinolone 0.025 % ointment Commonly known as:  KENALOG Apply 1 application topically 2 (two) times daily.       LABS:  Appointment on 10/16/2018  Component Date Value Ref Range Status  . VITD 10/16/2018 28.37* 30.00 - 100.00 ng/mL Final  . TSH 10/16/2018 0.63  0.35 - 4.50 uIU/mL Final  . Sodium 10/16/2018 140  135 - 145 mEq/L Final  . Potassium 10/16/2018 4.0  3.5 - 5.1 mEq/L Final  . Chloride 10/16/2018 104  96 - 112 mEq/L Final  . CO2 10/16/2018 25  19 - 32 mEq/L Final  . Glucose, Bld 10/16/2018 97  70 - 99 mg/dL Final  . BUN 10/16/2018 19  6 - 23 mg/dL Final  . Creatinine, Ser 10/16/2018 0.69  0.40 - 1.20 mg/dL Final  . Total Bilirubin 10/16/2018 0.4  0.2 - 1.2 mg/dL Final  . Alkaline Phosphatase 10/16/2018 139* 39 - 117 U/L Final  . AST 10/16/2018 17  0 - 37 U/L Final  . ALT 10/16/2018 12  0 - 35 U/L Final  . Total Protein 10/16/2018 7.4  6.0 - 8.3 g/dL Final  . Albumin 10/16/2018 4.0  3.5 - 5.2 g/dL Final  . Calcium 10/16/2018 9.3  8.4 - 10.5 mg/dL Final  . GFR 10/16/2018 110.54  >60.00 mL/min Final        Review of Systems  Obesity: This is chronic and not successful with her previous attempts with gastric bypass and lap band surgeries.  Also has prediabetes with A1c 6.1  Wt Readings from Last 3 Encounters:  10/19/18 289 lb 6.4 oz (131.3 kg)  08/17/18 285 lb 6.4 oz (129.5 kg)  08/14/18 284 lb 9.6 oz (129.1 kg)   Hypothyroidism: Mild, stable and adequately controlled with the 50 mcg levothyroxine  Lab Results  Component Value Date   TSH 0.63 10/16/2018   TSH 0.51 06/20/2018   TSH 0.56 12/18/2017   FREET4 0.87 06/20/2018   FREET4  0.9 12/15/2008     PHYSICAL EXAM:  BP 120/80 (BP Location: Left Arm, Patient Position: Sitting, Cuff Size: Normal)   Pulse 66   Ht 5\' 6"  (1.676 m)   Wt 289 lb 6.4 oz (131.3 kg)   SpO2 97%   BMI 46.71 kg/m     ASSESSMENT:    Increased alkaline phosphatase  likely to be from persistent vitamin D deficiency as no other cause found and she has normal liver functions.  Also in 2018 she had mild hypocalcemia  With vitamin D supplementation her alkaline phosphatase is progressively improved and now nearly normal    History of mild hypothyroidism: Taking only 50 mcg daily and has recent normal TSH   PLAN:   She will take her vitamin D with food in the evening at dinner  She will take additional 5000 units on Saturdays and Sundays of vitamin D  Follow-up with PCP for further management and to be seen as needed  Patient Instructions  Vitamin D take 2 on Weekends and 1 other days  Take with Catha Brow 10/21/2018, 2:07 PM

## 2018-12-06 ENCOUNTER — Other Ambulatory Visit: Payer: Self-pay | Admitting: Nurse Practitioner

## 2019-01-08 ENCOUNTER — Telehealth: Payer: Self-pay | Admitting: Internal Medicine

## 2019-01-08 NOTE — Telephone Encounter (Signed)
Copied from [place] [id number]. Topic: General - Other >> Jan 07, 2019  4:09 PM Antonieta Iba C wrote: Reason for CRM: pt called in to request TOC to Dr. Jenny Reichmann due to  [place] leaving. Pt says that her sister is a current pt of Dr. Jenny Reichmann. Pt says since being a pt at [facility] all of her previous providers has left. Pt says that she would like to have a provider that will/has been with the practice for a while.    CB: [phone] - please advise/assist

## 2019-01-08 NOTE — Telephone Encounter (Signed)
Ok with me 

## 2019-01-11 NOTE — Telephone Encounter (Signed)
Patient scheduled.

## 2019-03-04 ENCOUNTER — Other Ambulatory Visit: Payer: Self-pay | Admitting: *Deleted

## 2019-03-04 MED ORDER — LOSARTAN POTASSIUM-HCTZ 100-12.5 MG PO TABS: 1.0000 | ORAL_TABLET | Freq: Every day | ORAL | 0 refills | Status: DC

## 2019-04-01 ENCOUNTER — Other Ambulatory Visit: Payer: Self-pay | Admitting: *Deleted

## 2019-04-01 DIAGNOSIS — E039 Hypothyroidism, unspecified: Secondary | ICD-10-CM

## 2019-04-01 MED ORDER — LEVOTHYROXINE SODIUM 50 MCG PO TABS: 50.0000 ug | ORAL_TABLET | Freq: Every day | ORAL | 0 refills | Status: DC

## 2019-04-02 ENCOUNTER — Encounter: Payer: Self-pay | Admitting: Internal Medicine

## 2019-04-02 ENCOUNTER — Ambulatory Visit (INDEPENDENT_AMBULATORY_CARE_PROVIDER_SITE_OTHER): Payer: Medicare Other | Admitting: Internal Medicine

## 2019-04-02 ENCOUNTER — Other Ambulatory Visit: Payer: Self-pay | Admitting: Internal Medicine

## 2019-04-02 ENCOUNTER — Other Ambulatory Visit (INDEPENDENT_AMBULATORY_CARE_PROVIDER_SITE_OTHER): Payer: Medicare Other

## 2019-04-02 DIAGNOSIS — E611 Iron deficiency: Secondary | ICD-10-CM | POA: Diagnosis not present

## 2019-04-02 DIAGNOSIS — Z0000 Encounter for general adult medical examination without abnormal findings: Secondary | ICD-10-CM | POA: Diagnosis not present

## 2019-04-02 DIAGNOSIS — R7309 Other abnormal glucose: Secondary | ICD-10-CM

## 2019-04-02 DIAGNOSIS — Z6841 Body Mass Index (BMI) 40.0 and over, adult: Secondary | ICD-10-CM

## 2019-04-02 DIAGNOSIS — E538 Deficiency of other specified B group vitamins: Secondary | ICD-10-CM

## 2019-04-02 DIAGNOSIS — E559 Vitamin D deficiency, unspecified: Secondary | ICD-10-CM

## 2019-04-02 LAB — CBC WITH DIFFERENTIAL/PLATELET
Basophils Absolute: 0.1 10*3/uL (ref 0.0–0.1)
Basophils Relative: 0.7 % (ref 0.0–3.0)
Eosinophils Absolute: 0.2 10*3/uL (ref 0.0–0.7)
Eosinophils Relative: 2.1 % (ref 0.0–5.0)
HCT: 36.7 % (ref 36.0–46.0)
Hemoglobin: 12.1 g/dL (ref 12.0–15.0)
Lymphocytes Relative: 24.8 % (ref 12.0–46.0)
Lymphs Abs: 1.9 10*3/uL (ref 0.7–4.0)
MCHC: 33.1 g/dL (ref 30.0–36.0)
MCV: 82.2 fl (ref 78.0–100.0)
Monocytes Absolute: 0.5 10*3/uL (ref 0.1–1.0)
Monocytes Relative: 7.1 % (ref 3.0–12.0)
Neutro Abs: 4.9 10*3/uL (ref 1.4–7.7)
Neutrophils Relative %: 65.3 % (ref 43.0–77.0)
Platelets: 245 10*3/uL (ref 150.0–400.0)
RBC: 4.47 Mil/uL (ref 3.87–5.11)
RDW: 14.4 % (ref 11.5–15.5)
WBC: 7.6 10*3/uL (ref 4.0–10.5)

## 2019-04-02 LAB — HEPATIC FUNCTION PANEL
ALT: 11 U/L (ref 0–35)
AST: 15 U/L (ref 0–37)
Albumin: 3.8 g/dL (ref 3.5–5.2)
Alkaline Phosphatase: 127 U/L — ABNORMAL HIGH (ref 39–117)
Bilirubin, Direct: 0.1 mg/dL (ref 0.0–0.3)
Total Bilirubin: 0.4 mg/dL (ref 0.2–1.2)
Total Protein: 7.1 g/dL (ref 6.0–8.3)

## 2019-04-02 LAB — URINALYSIS, ROUTINE W REFLEX MICROSCOPIC
Bilirubin Urine: NEGATIVE
Ketones, ur: NEGATIVE
Leukocytes,Ua: NEGATIVE
Nitrite: NEGATIVE
RBC / HPF: NONE SEEN (ref 0–?)
Specific Gravity, Urine: 1.025 (ref 1.000–1.030)
Total Protein, Urine: NEGATIVE
Urine Glucose: NEGATIVE
Urobilinogen, UA: 0.2 (ref 0.0–1.0)
pH: 5.5 (ref 5.0–8.0)

## 2019-04-02 LAB — LIPID PANEL
Cholesterol: 182 mg/dL (ref 0–200)
HDL: 72.2 mg/dL (ref >39.00)
LDL Cholesterol: 100 mg/dL — ABNORMAL HIGH (ref 0–99)
NonHDL: 110.04
Total CHOL/HDL Ratio: 3
Triglycerides: 48 mg/dL (ref 0.0–149.0)
VLDL: 9.6 mg/dL (ref 0.0–40.0)

## 2019-04-02 LAB — HEMOGLOBIN A1C: Hgb A1c MFr Bld: 6.2 % (ref 4.6–6.5)

## 2019-04-02 LAB — BASIC METABOLIC PANEL
BUN: 17 mg/dL (ref 6–23)
CO2: 30 mEq/L (ref 19–32)
Calcium: 8.9 mg/dL (ref 8.4–10.5)
Chloride: 104 mEq/L (ref 96–112)
Creatinine, Ser: 0.61 mg/dL (ref 0.40–1.20)
GFR: 119.72 mL/min (ref >60.00)
Glucose, Bld: 91 mg/dL (ref 70–99)
Potassium: 4 mEq/L (ref 3.5–5.1)
Sodium: 140 mEq/L (ref 135–145)

## 2019-04-02 LAB — VITAMIN D 25 HYDROXY (VIT D DEFICIENCY, FRACTURES): VITD: 29 ng/mL — ABNORMAL LOW (ref 30.00–100.00)

## 2019-04-02 LAB — IBC PANEL
Iron: 76 ug/dL (ref 42–145)
Saturation Ratios: 19.1 % — ABNORMAL LOW (ref 20.0–50.0)
Transferrin: 284 mg/dL (ref 212.0–360.0)

## 2019-04-02 LAB — VITAMIN B12: Vitamin B-12: 487 pg/mL (ref 211–911)

## 2019-04-02 LAB — TSH: TSH: 0.22 u[IU]/mL — ABNORMAL LOW (ref 0.35–4.50)

## 2019-04-02 MED ORDER — PHENTERMINE HCL 37.5 MG PO CAPS: 37.5000 mg | ORAL_CAPSULE | ORAL | 2 refills | Status: DC

## 2019-04-02 MED ORDER — VITAMIN D (ERGOCALCIFEROL) 1.25 MG (50000 UNIT) PO CAPS: 50000.0000 [IU] | ORAL_CAPSULE | ORAL | 0 refills | Status: DC

## 2019-04-02 NOTE — Patient Instructions (Signed)
Please take all new medication as prescribed - the phentermine  Please continue all other medications as before, and refills have been done if requested.  Please have the pharmacy call with any other refills you may need.  Please continue your efforts at being more active, low cholesterol diet, and weight control.  You are otherwise up to date with prevention measures today.  Please keep your appointments with your specialists as you may have planned  Please go to the LAB in the Basement (turn left off the elevator) for the tests to be done today  You will be contacted by phone if any changes need to be made immediately.  Otherwise, you will receive a letter about your results with an explanation, but please check with MyChart first.  Please remember to sign up for MyChart if you have not done so, as this will be important to you in the future with finding out test results, communicating by private email, and scheduling acute appointments online when needed.  Please return in 6 months, or sooner if needed

## 2019-04-02 NOTE — Assessment & Plan Note (Signed)
stable overall by history and exam, recent data reviewed with pt, and pt to continue medical treatment as before,  to f/u any worsening symptoms or concerns  

## 2019-04-02 NOTE — Progress Notes (Signed)
Patient ID: Gina Ryan, female   DOB: 14-Jul-1956, 63 y.o.   MRN: [id number]  Virtual Visit via Video Note  I connected with Gina Ryan on 04/02/19 at  2:00 PM EDT by a video enabled telemedicine application and verified that I am speaking with the correct person using two identifiers.  Location: Patient: at home Provider: at office   I discussed the limitations of evaluation and management by telemedicine and the availability of in person appointments. The patient expressed understanding and agreed to proceed.  History of Present Illness: Here for wellness and f/u;  Overall doing ok;  Pt denies Chest pain, worsening SOB, DOE, wheezing, orthopnea, PND, worsening LE edema, palpitations, dizziness or syncope.  Pt denies neurological change such as new headache, facial or extremity weakness.  Pt denies polydipsia, polyuria, or low sugar symptoms. Pt states overall good compliance with treatment and medications, good tolerability, and has been trying to follow appropriate diet.  Pt denies worsening depressive symptoms, suicidal ideation or panic. No fever, night sweats, wt loss, loss of appetite, or other constitutional symptoms.  Pt states good ability with ADL's, has low fall risk, home safety reviewed and adequate, no other significant changes in hearing or vision, and only occasionally active with exercise.  BP less than 140/90.  Cont's to gain wt, asks for phentermine . Past Medical History:  Diagnosis Date  . Atypical chest pain   . Blood transfusion without reported diagnosis   . GERD (gastroesophageal reflux disease)   . Hypertension   . Morbid obesity ([place])   . Osteoarthritis, knee   . Thyroid disease    Past Surgical History:  Procedure Laterality Date  . CHOLECYSTECTOMY    . COSMETIC SURGERY  07/2010   (lower facelift, lower blepharoplasty)  . GASTRIC BYPASS    . LAPAROSCOPIC GASTRIC BANDING    . ROTATOR CUFF REPAIR Bilateral   . TOTAL ABDOMINAL HYSTERECTOMY    . TOTAL KNEE  ARTHROPLASTY Left 10/28/2016   Procedure: LEFT TOTAL KNEE ARTHROPLASTY;  Surgeon: Sydnee Cabal, MD;  Location: [facility];  Service: Orthopedics;  Laterality: Left;  . TOTAL KNEE ARTHROPLASTY Right 03/10/2017   Procedure: RIGHT TOTAL KNEE ARTHROPLASTY;  Surgeon: Sydnee Cabal, MD;  Location: [facility];  Service: Orthopedics;  Laterality: Right;    reports that she has never smoked. She has never used smokeless tobacco. She reports that she does not drink alcohol or use drugs. family history includes Cancer in her brother; Colon cancer in her maternal uncle; Colon cancer (age of onset: 53) in her brother; Hypertension in her mother; Prostate cancer in her brother; Stroke in her mother. Allergies  Allergen Reactions  . Vicodin [Hydrocodone-Acetaminophen] Shortness Of Breath    REACTION: Shortness of Breath   . Hydrocodone-Acetaminophen     REACTION: Shortness of Breath  . Valsartan     Eye redness. No current issues with losartan Eye redness. No current issues with losartan   Current Outpatient Medications on File Prior to Visit  Medication Sig Dispense Refill  . ibuprofen (ADVIL,MOTRIN) 800 MG tablet Take 1 tablet (800 mg total) by mouth as needed. 90 tablet 0  . levothyroxine (SYNTHROID) 50 MCG tablet Take 1 tablet (50 mcg total) by mouth daily. 90 tablet 0  . losartan-hydrochlorothiazide (HYZAAR) 100-12.5 MG tablet Take 1 tablet by mouth daily. 90 tablet 0  . methocarbamol (ROBAXIN) 500 MG tablet Take 1 tablet (500 mg total) by mouth every 8 (eight) hours as needed for muscle spasms. 20 tablet 0  .  pantoprazole (PROTONIX) 40 MG tablet TAKE 1 TABLET(40 MG) BY MOUTH DAILY 90 tablet 1  . tobramycin (TOBREX) 0.3 % ophthalmic solution 1 gtt to affected eye every 6 hours 5 mL 0  . triamcinolone (KENALOG) 0.025 % ointment Apply 1 application topically 2 (two) times daily. 80 g 1  . [DISCONTINUED] potassium chloride (K-DUR) 10 MEQ tablet Take 1 tablet (10 mEq total) by mouth daily. 90 tablet 0   No  current facility-administered medications on file prior to visit.     Observations/Objective: Alert, NAD, appropriate mood and affect, resps normal, cn 2-12 intact, moves all 4s, no visible rash or swelling Lab Results  Component Value Date   WBC 7.7 08/01/2018   HGB 12.3 08/01/2018   HCT 40.8 08/01/2018   PLT 281 08/01/2018   GLUCOSE 97 10/16/2018   CHOL 224 (H) 03/27/2018   TRIG 59.0 03/27/2018   HDL 99.80 03/27/2018   LDLDIRECT 99.0 12/15/2008   LDLCALC 112 (H) 03/27/2018   ALT 12 10/16/2018   AST 17 10/16/2018   NA 140 10/16/2018   K 4.0 10/16/2018   CL 104 10/16/2018   CREATININE 0.69 10/16/2018   BUN 19 10/16/2018   CO2 25 10/16/2018   TSH 0.63 10/16/2018   INR 0.95 03/03/2017   HGBA1C 6.1 03/27/2018   Assessment and Plan: See notes  Follow Up Instructions: See notes   I discussed the assessment and treatment plan with the patient. The patient was provided an opportunity to ask questions and all were answered. The patient agreed with the plan and demonstrated an understanding of the instructions.   The patient was advised to call back or seek an in-person evaluation if the symptoms worsen or if the condition fails to improve as anticipated.   Cathlean Cower, MD

## 2019-04-02 NOTE — Assessment & Plan Note (Signed)
S/p gastric bypass with some wt regain, for phentermine asd,  to f/u any worsening symptoms or concerns

## 2019-04-02 NOTE — Assessment & Plan Note (Signed)

## 2019-04-03 ENCOUNTER — Telehealth: Payer: Self-pay

## 2019-04-03 DIAGNOSIS — R7989 Other specified abnormal findings of blood chemistry: Secondary | ICD-10-CM

## 2019-04-03 NOTE — Addendum Note (Signed)
Addended by: Biagio Borg on: 04/03/2019 05:51 PM   Modules accepted: Orders

## 2019-04-03 NOTE — Telephone Encounter (Signed)
Pt has been informed of results and expressed understanding.  Pt mentioned that she would like a referral to a thyroid specialist.

## 2019-04-03 NOTE — Telephone Encounter (Signed)
I dont see the need for the endo referral  OK to repeat testing in 2 wks - orders done

## 2019-04-03 NOTE — Telephone Encounter (Signed)
-----   Message from Biagio Borg, MD sent at 04/02/2019  6:22 PM EDT ----- Left message on MyChart, pt to cont same tx except  The test results show that your current treatment is OK, except the Vitamin D level is quite low.  The thyroid test is slightly off as well, but may not be significant, so I am no inclined to change your medication as many previous tests were normal.  For the low Vitamin D, please take a prescription Vit D 50000 units weekly for 12 weeks, then plan to take OTC Vitamin D3 at 2000 units per day after that indefinitely  Janvi Ammar to please inform pt, I will do rx

## 2019-04-04 NOTE — Telephone Encounter (Signed)
Pt has been informed and expressed understanding.  

## 2019-05-31 ENCOUNTER — Other Ambulatory Visit: Payer: Self-pay | Admitting: *Deleted

## 2019-05-31 DIAGNOSIS — K219 Gastro-esophageal reflux disease without esophagitis: Secondary | ICD-10-CM

## 2019-05-31 DIAGNOSIS — L309 Dermatitis, unspecified: Secondary | ICD-10-CM

## 2019-05-31 MED ORDER — PANTOPRAZOLE SODIUM 40 MG PO TBEC: DELAYED_RELEASE_TABLET | ORAL | 1 refills | Status: DC

## 2019-05-31 MED ORDER — TRIAMCINOLONE ACETONIDE 0.025 % EX OINT: 1.0000 "application " | TOPICAL_OINTMENT | Freq: Two times a day (BID) | CUTANEOUS | 1 refills | Status: DC

## 2019-06-10 ENCOUNTER — Other Ambulatory Visit: Payer: Self-pay | Admitting: Internal Medicine

## 2019-06-11 MED ORDER — LOSARTAN POTASSIUM-HCTZ 100-12.5 MG PO TABS: 1.0000 | ORAL_TABLET | Freq: Every day | ORAL | 0 refills | Status: DC

## 2019-07-26 ENCOUNTER — Other Ambulatory Visit: Payer: Self-pay | Admitting: Internal Medicine

## 2019-07-26 DIAGNOSIS — K219 Gastro-esophageal reflux disease without esophagitis: Secondary | ICD-10-CM

## 2019-07-26 MED ORDER — PANTOPRAZOLE SODIUM 40 MG PO TBEC: DELAYED_RELEASE_TABLET | ORAL | 1 refills | Status: DC

## 2019-07-26 NOTE — Telephone Encounter (Signed)
Medication Refill: pantoprazole (PROTONIX) 40 MG tablet [id number]   Pharmacy:  [facility] [place], [place] - [address] [phone] (Phone) [phone] (Fax)   Pt aware of turn around time.   Pt is completely out.

## 2019-07-26 NOTE — Telephone Encounter (Signed)
Requested medication (s) are due for refill today: yes  Requested medication (s) are on the active medication list: yes  Last refill:  05/31/2019  Future visit scheduled: yes  Notes to clinic:  Review for refill   Requested Prescriptions  Pending Prescriptions Disp Refills   pantoprazole (PROTONIX) 40 MG tablet 90 tablet 1    Sig: TAKE 1 TABLET(40 MG) BY MOUTH DAILY     Gastroenterology: Proton Pump Inhibitors Passed - 07/26/2019  9:05 AM      Passed - Valid encounter within last 12 months    Recent Outpatient Visits          3 months ago Routine general medical examination at a health care facility   [place], James W, MD   11 months ago Gastroesophageal reflux disease, esophagitis presence not specified   [place], Delphia Grates, NP   11 months ago Need for influenza vaccination   [place], Delphia Grates, NP   1 year ago Routine general medical examination at a health care facility   [place], Delphia Grates, NP   1 year ago [place], [place], NP      Future Appointments            In 2 months Jenny Reichmann, Hunt Oris, MD [place]

## 2019-07-29 DIAGNOSIS — M65312 Trigger thumb, left thumb: Secondary | ICD-10-CM | POA: Insufficient documentation

## 2019-08-24 ENCOUNTER — Ambulatory Visit (INDEPENDENT_AMBULATORY_CARE_PROVIDER_SITE_OTHER): Payer: Medicare Other

## 2019-08-24 ENCOUNTER — Other Ambulatory Visit: Payer: Self-pay

## 2019-08-24 DIAGNOSIS — Z23 Encounter for immunization: Secondary | ICD-10-CM | POA: Diagnosis not present

## 2019-09-03 ENCOUNTER — Other Ambulatory Visit: Payer: Self-pay

## 2019-09-03 MED ORDER — LOSARTAN POTASSIUM-HCTZ 100-12.5 MG PO TABS: 1.0000 | ORAL_TABLET | Freq: Every day | ORAL | 0 refills | Status: DC

## 2019-09-16 ENCOUNTER — Ambulatory Visit (INDEPENDENT_AMBULATORY_CARE_PROVIDER_SITE_OTHER): Payer: Medicare Other | Admitting: Internal Medicine

## 2019-09-16 ENCOUNTER — Encounter: Payer: Self-pay | Admitting: Internal Medicine

## 2019-09-16 ENCOUNTER — Other Ambulatory Visit: Payer: Self-pay

## 2019-09-16 VITALS — BP 138/84 | HR 69 | Temp 98.6°F | Wt 290.0 lb

## 2019-09-16 DIAGNOSIS — S82892A Other fracture of left lower leg, initial encounter for closed fracture: Secondary | ICD-10-CM | POA: Insufficient documentation

## 2019-09-16 DIAGNOSIS — M810 Age-related osteoporosis without current pathological fracture: Secondary | ICD-10-CM

## 2019-09-16 DIAGNOSIS — I10 Essential (primary) hypertension: Secondary | ICD-10-CM

## 2019-09-16 DIAGNOSIS — S82892D Other fracture of left lower leg, subsequent encounter for closed fracture with routine healing: Secondary | ICD-10-CM | POA: Diagnosis not present

## 2019-09-16 DIAGNOSIS — R7309 Other abnormal glucose: Secondary | ICD-10-CM | POA: Diagnosis not present

## 2019-09-16 DIAGNOSIS — K219 Gastro-esophageal reflux disease without esophagitis: Secondary | ICD-10-CM | POA: Diagnosis not present

## 2019-09-16 MED ORDER — FAMOTIDINE 20 MG PO TABS: 20.0000 mg | ORAL_TABLET | Freq: Two times a day (BID) | ORAL | 3 refills | Status: DC

## 2019-09-16 MED ORDER — PANTOPRAZOLE SODIUM 40 MG PO TBEC: 40.0000 mg | DELAYED_RELEASE_TABLET | Freq: Two times a day (BID) | ORAL | 3 refills | Status: DC

## 2019-09-16 NOTE — Assessment & Plan Note (Signed)
stable overall by history and exam, recent data reviewed with pt, and pt to continue medical treatment as before,  to f/u any worsening symptoms or concerns  

## 2019-09-16 NOTE — Progress Notes (Signed)
Subjective:    Patient ID: Gina Ryan, female    DOB: 1955-12-12, 63 y.o.   MRN: [id number]  HPI  Here to f/u; overall doing ok,  Pt denies chest pain, increasing sob or doe, wheezing, orthopnea, PND, increased LE swelling, palpitations, dizziness or syncope.  Pt denies new neurological symptoms such as new headache, or facial or extremity weakness or numbness.  Pt denies polydipsia, polyuria, or low sugar episode.  Pt states overall good compliance with meds, mostly trying to follow appropriate diet but has had remarkable increase in reflux recently with wt gain, was ok for about 1 yr on the PPI alone but now has daily symptoms worse with tomato, spicy foods and higher fat foods, including teeth erosions.  Also now s/o what appears to be non traumatic left ankle fx tx per ortho, no recent DXA Past Medical History:  Diagnosis Date  . Atypical chest pain   . Blood transfusion without reported diagnosis   . GERD (gastroesophageal reflux disease)   . Hypertension   . Morbid obesity ([place])   . Osteoarthritis, knee   . Thyroid disease    Past Surgical History:  Procedure Laterality Date  . CHOLECYSTECTOMY    . COSMETIC SURGERY  07/2010   (lower facelift, lower blepharoplasty)  . GASTRIC BYPASS    . LAPAROSCOPIC GASTRIC BANDING    . ROTATOR CUFF REPAIR Bilateral   . TOTAL ABDOMINAL HYSTERECTOMY    . TOTAL KNEE ARTHROPLASTY Left 10/28/2016   Procedure: LEFT TOTAL KNEE ARTHROPLASTY;  Surgeon: Sydnee Cabal, MD;  Location: [facility];  Service: Orthopedics;  Laterality: Left;  . TOTAL KNEE ARTHROPLASTY Right 03/10/2017   Procedure: RIGHT TOTAL KNEE ARTHROPLASTY;  Surgeon: Sydnee Cabal, MD;  Location: [facility];  Service: Orthopedics;  Laterality: Right;    reports that she has never smoked. She has never used smokeless tobacco. She reports that she does not drink alcohol or use drugs. family history includes Cancer in her brother; Colon cancer in her maternal uncle; Colon cancer (age of onset:  63) in her brother; Hypertension in her mother; Prostate cancer in her brother; Stroke in her mother. Allergies  Allergen Reactions  . Vicodin [Hydrocodone-Acetaminophen] Shortness Of Breath    REACTION: Shortness of Breath   . Hydrocodone-Acetaminophen     REACTION: Shortness of Breath  . Valsartan     Eye redness. No current issues with losartan Eye redness. No current issues with losartan   Current Outpatient Medications on File Prior to Visit  Medication Sig Dispense Refill  . ibuprofen (ADVIL,MOTRIN) 800 MG tablet Take 1 tablet (800 mg total) by mouth as needed. 90 tablet 0  . levothyroxine (SYNTHROID) 50 MCG tablet Take 1 tablet (50 mcg total) by mouth daily. 90 tablet 0  . losartan-hydrochlorothiazide (HYZAAR) 100-12.5 MG tablet Take 1 tablet by mouth daily. 90 tablet 0  . methocarbamol (ROBAXIN) 500 MG tablet Take 1 tablet (500 mg total) by mouth every 8 (eight) hours as needed for muscle spasms. 20 tablet 0  . phentermine 37.5 MG capsule Take 1 capsule (37.5 mg total) by mouth every morning. 30 capsule 2  . tobramycin (TOBREX) 0.3 % ophthalmic solution 1 gtt to affected eye every 6 hours 5 mL 0  . triamcinolone (KENALOG) 0.025 % ointment Apply 1 application topically 2 (two) times daily. 80 g 1  . Vitamin D, Ergocalciferol, (DRISDOL) 1.25 MG (50000 UT) CAPS capsule Take 1 capsule (50,000 Units total) by mouth every 7 (seven) days. 12 capsule 0  . [  DISCONTINUED] potassium chloride (K-DUR) 10 MEQ tablet Take 1 tablet (10 mEq total) by mouth daily. 90 tablet 0   No current facility-administered medications on file prior to visit.    Review of Systems  Constitutional: Negative for other unusual diaphoresis or sweats HENT: Negative for ear discharge or swelling Eyes: Negative for other worsening visual disturbances Respiratory: Negative for stridor or other swelling  Gastrointestinal: Negative for worsening distension or other blood Genitourinary: Negative for retention or other  urinary change Musculoskeletal: Negative for other MSK pain or swelling Skin: Negative for color change or other new lesions Neurological: Negative for worsening tremors and other numbness  Psychiatric/Behavioral: Negative for worsening agitation or other fatigue All otherwise neg per pt     Objective:   Physical Exam BP 138/84   Pulse 69   Temp 98.6 F (37 C) (Oral)   Wt 290 lb (131.5 kg)   SpO2 98%   BMI 46.81 kg/m  VS noted,  Constitutional: Pt appears in NAD HENT: Head: NCAT.  Right Ear: External ear normal.  Left Ear: External ear normal.  Eyes: . Pupils are equal, round, and reactive to light. Conjunctivae and EOM are normal Nose: without d/c or deformity Neck: Neck supple. Gross normal ROM Cardiovascular: Normal rate and regular rhythm.   Pulmonary/Chest: Effort normal and breath sounds without rales or wheezing.  Abd:  Soft, NT, ND, + BS, no organomegaly Neurological: Pt is alert. At baseline orientation, motor grossly intact Skin: Skin is warm. No rashes, other new lesions, no LE edema, left ankle in soft cast Psychiatric: Pt behavior is normal without agitation  All otherwise neg per pt Lab Results  Component Value Date   WBC 7.6 04/02/2019   HGB 12.1 04/02/2019   HCT 36.7 04/02/2019   PLT 245.0 04/02/2019   GLUCOSE 91 04/02/2019   CHOL 182 04/02/2019   TRIG 48.0 04/02/2019   HDL 72.20 04/02/2019   LDLDIRECT 99.0 12/15/2008   LDLCALC 100 (H) 04/02/2019   ALT 11 04/02/2019   AST 15 04/02/2019   NA 140 04/02/2019   K 4.0 04/02/2019   CL 104 04/02/2019   CREATININE 0.61 04/02/2019   BUN 17 04/02/2019   CO2 30 04/02/2019   TSH 0.22 (L) 04/02/2019   INR 0.95 03/03/2017   HGBA1C 6.2 04/02/2019      Assessment & Plan:

## 2019-09-16 NOTE — Assessment & Plan Note (Signed)
Uncontrolled, for increased PPI bid, and add pepcid 20 bid, consider GI referral

## 2019-09-16 NOTE — Patient Instructions (Addendum)
Please schedule the bone density test before leaving today at the scheduling desk (where you check out)  Ok to incresae the protonix to 40 mg twice per day  Please take all new medication as prescribed - the pepcid at 20 mg twice per day  Please continue all other medications as before, and refills have been done if requested.  Please have the pharmacy call with any other refills you may need.  Please continue your efforts at being more active, low cholesterol diet, and weight control..  Please keep your appointments with your specialists as you may have planned

## 2019-09-16 NOTE — Assessment & Plan Note (Signed)
Also for dxa asd,  to f/u any worsening symptoms or concerns

## 2019-09-17 ENCOUNTER — Ambulatory Visit (INDEPENDENT_AMBULATORY_CARE_PROVIDER_SITE_OTHER)
Admission: RE | Admit: 2019-09-17 | Discharge: 2019-09-17 | Disposition: A | Discharge status: Home / Self Care | Payer: Medicare Other | Source: Ambulatory Visit | Attending: Internal Medicine | Admitting: Internal Medicine

## 2019-09-17 DIAGNOSIS — M810 Age-related osteoporosis without current pathological fracture: Secondary | ICD-10-CM | POA: Diagnosis not present

## 2019-09-19 ENCOUNTER — Other Ambulatory Visit: Payer: Self-pay | Admitting: Internal Medicine

## 2019-09-19 MED ORDER — ALENDRONATE SODIUM 70 MG PO TABS: 70.0000 mg | ORAL_TABLET | ORAL | 3 refills | Status: DC

## 2019-09-24 ENCOUNTER — Other Ambulatory Visit: Payer: Self-pay | Admitting: Internal Medicine

## 2019-09-24 DIAGNOSIS — E039 Hypothyroidism, unspecified: Secondary | ICD-10-CM

## 2019-09-25 MED ORDER — LEVOTHYROXINE SODIUM 50 MCG PO TABS: 50.0000 ug | ORAL_TABLET | Freq: Every day | ORAL | 0 refills | Status: DC

## 2019-10-04 ENCOUNTER — Encounter: Payer: Self-pay | Admitting: Internal Medicine

## 2019-10-04 ENCOUNTER — Ambulatory Visit: Payer: Medicare Other | Admitting: Internal Medicine

## 2019-10-04 ENCOUNTER — Other Ambulatory Visit (INDEPENDENT_AMBULATORY_CARE_PROVIDER_SITE_OTHER): Payer: Medicare Other

## 2019-10-04 ENCOUNTER — Other Ambulatory Visit: Payer: Self-pay

## 2019-10-04 ENCOUNTER — Ambulatory Visit (INDEPENDENT_AMBULATORY_CARE_PROVIDER_SITE_OTHER): Payer: Medicare Other | Admitting: Internal Medicine

## 2019-10-04 VITALS — BP 124/82 | HR 84 | Temp 98.5°F | Ht 66.0 in | Wt 287.0 lb

## 2019-10-04 DIAGNOSIS — M8580 Other specified disorders of bone density and structure, unspecified site: Secondary | ICD-10-CM | POA: Diagnosis not present

## 2019-10-04 DIAGNOSIS — I10 Essential (primary) hypertension: Secondary | ICD-10-CM | POA: Diagnosis not present

## 2019-10-04 DIAGNOSIS — R7309 Other abnormal glucose: Secondary | ICD-10-CM | POA: Diagnosis not present

## 2019-10-04 DIAGNOSIS — R7989 Other specified abnormal findings of blood chemistry: Secondary | ICD-10-CM

## 2019-10-04 DIAGNOSIS — E039 Hypothyroidism, unspecified: Secondary | ICD-10-CM | POA: Diagnosis not present

## 2019-10-04 DIAGNOSIS — E559 Vitamin D deficiency, unspecified: Secondary | ICD-10-CM

## 2019-10-04 LAB — HEMOGLOBIN A1C: Hgb A1c MFr Bld: 6 % (ref 4.6–6.5)

## 2019-10-04 LAB — TSH: TSH: 0.62 u[IU]/mL (ref 0.35–4.50)

## 2019-10-04 LAB — T4, FREE: Free T4: 1.25 ng/dL (ref 0.60–1.60)

## 2019-10-04 NOTE — Progress Notes (Signed)
Subjective:    Patient ID: Gina Ryan, female    DOB: 1956-04-14, 63 y.o.   MRN: [id number]  HPI  Here to f/u; overall doing ok,  Pt denies chest pain, increasing sob or doe, wheezing, orthopnea, PND, increased LE swelling, palpitations, dizziness or syncope.  Pt denies new neurological symptoms such as new headache, or facial or extremity weakness or numbness.  Pt denies polydipsia, polyuria, or low sugar episode.  Pt states overall good compliance with meds, mostly trying to follow appropriate diet, with wt overall stable,  but little exercise however.  Denies hyper or hypo thyroid symptoms such as voice, skin or hair change.  Has low vit D, not taking otc yet.  Has not started the fosamax yet.   BP Readings from Last 3 Encounters:  10/04/19 124/82  09/16/19 138/84  10/19/18 120/80   Wt Readings from Last 3 Encounters:  10/04/19 287 lb (130.2 kg)  09/16/19 290 lb (131.5 kg)  10/19/18 289 lb 6.4 oz (131.3 kg)   Past Medical History:  Diagnosis Date  . Atypical chest pain   . Blood transfusion without reported diagnosis   . GERD (gastroesophageal reflux disease)   . Hypertension   . Morbid obesity ([place])   . Osteoarthritis, knee   . Thyroid disease    Past Surgical History:  Procedure Laterality Date  . CHOLECYSTECTOMY    . COSMETIC SURGERY  07/2010   (lower facelift, lower blepharoplasty)  . GASTRIC BYPASS    . LAPAROSCOPIC GASTRIC BANDING    . ROTATOR CUFF REPAIR Bilateral   . TOTAL ABDOMINAL HYSTERECTOMY    . TOTAL KNEE ARTHROPLASTY Left 10/28/2016   Procedure: LEFT TOTAL KNEE ARTHROPLASTY;  Surgeon: Sydnee Cabal, MD;  Location: [facility];  Service: Orthopedics;  Laterality: Left;  . TOTAL KNEE ARTHROPLASTY Right 03/10/2017   Procedure: RIGHT TOTAL KNEE ARTHROPLASTY;  Surgeon: Sydnee Cabal, MD;  Location: [facility];  Service: Orthopedics;  Laterality: Right;    reports that she has never smoked. She has never used smokeless tobacco. She reports that she does not drink  alcohol or use drugs. family history includes Cancer in her brother; Colon cancer in her maternal uncle; Colon cancer (age of onset: 42) in her brother; Hypertension in her mother; Prostate cancer in her brother; Stroke in her mother. Allergies  Allergen Reactions  . Vicodin [Hydrocodone-Acetaminophen] Shortness Of Breath    REACTION: Shortness of Breath   . Hydrocodone-Acetaminophen     REACTION: Shortness of Breath  . Valsartan     Eye redness. No current issues with losartan Eye redness. No current issues with losartan   Current Outpatient Medications on File Prior to Visit  Medication Sig Dispense Refill  . alendronate (FOSAMAX) 70 MG tablet Take 1 tablet (70 mg total) by mouth every 7 (seven) days. Take with a full glass of water on an empty stomach. 12 tablet 3  . famotidine (PEPCID) 20 MG tablet Take 1 tablet (20 mg total) by mouth 2 (two) times daily. 180 tablet 3  . ibuprofen (ADVIL,MOTRIN) 800 MG tablet Take 1 tablet (800 mg total) by mouth as needed. 90 tablet 0  . levothyroxine (SYNTHROID) 50 MCG tablet Take 1 tablet (50 mcg total) by mouth daily. 90 tablet 0  . losartan-hydrochlorothiazide (HYZAAR) 100-12.5 MG tablet Take 1 tablet by mouth daily. 90 tablet 0  . methocarbamol (ROBAXIN) 500 MG tablet Take 1 tablet (500 mg total) by mouth every 8 (eight) hours as needed for muscle spasms. 20 tablet 0  .  pantoprazole (PROTONIX) 40 MG tablet Take 1 tablet (40 mg total) by mouth 2 (two) times daily before a meal. 180 tablet 3  . phentermine 37.5 MG capsule Take 1 capsule (37.5 mg total) by mouth every morning. 30 capsule 2  . triamcinolone (KENALOG) 0.025 % ointment Apply 1 application topically 2 (two) times daily. 80 g 1  . Vitamin D, Ergocalciferol, (DRISDOL) 1.25 MG (50000 UT) CAPS capsule Take 1 capsule (50,000 Units total) by mouth every 7 (seven) days. 12 capsule 0  . [DISCONTINUED] potassium chloride (K-DUR) 10 MEQ tablet Take 1 tablet (10 mEq total) by mouth daily. 90 tablet  0   No current facility-administered medications on file prior to visit.    Review of Systems  Constitutional: Negative for other unusual diaphoresis or sweats HENT: Negative for ear discharge or swelling Eyes: Negative for other worsening visual disturbances Respiratory: Negative for stridor or other swelling  Gastrointestinal: Negative for worsening distension or other blood Genitourinary: Negative for retention or other urinary change Musculoskeletal: Negative for other MSK pain or swelling Skin: Negative for color change or other new lesions Neurological: Negative for worsening tremors and other numbness  Psychiatric/Behavioral: Negative for worsening agitation or other fatigue All otherwise neg per pt    Objective:   Physical Exam BP 124/82   Pulse 84   Temp 98.5 F (36.9 C) (Oral)   Ht 5\' 6"  (1.676 m)   Wt 287 lb (130.2 kg)   SpO2 96%   BMI 46.32 kg/m  VS noted,  Constitutional: Pt appears in NAD HENT: Head: NCAT.  Right Ear: External ear normal.  Left Ear: External ear normal.  Eyes: . Pupils are equal, round, and reactive to light. Conjunctivae and EOM are normal Nose: without d/c or deformity Neck: Neck supple. Gross normal ROM Cardiovascular: Normal rate and regular rhythm.   Pulmonary/Chest: Effort normal and breath sounds without rales or wheezing.  Abd:  Soft, NT, ND, + BS, no organomegaly Neurological: Pt is alert. At baseline orientation, motor grossly intact Skin: Skin is warm. No rashes, other new lesions, no LE edema Psychiatric: Pt behavior is normal without agitation  All otherwise neg per pt Lab Results  Component Value Date   WBC 7.6 04/02/2019   HGB 12.1 04/02/2019   HCT 36.7 04/02/2019   PLT 245.0 04/02/2019   GLUCOSE 91 04/02/2019   CHOL 182 04/02/2019   TRIG 48.0 04/02/2019   HDL 72.20 04/02/2019   LDLDIRECT 99.0 12/15/2008   LDLCALC 100 (H) 04/02/2019   ALT 11 04/02/2019   AST 15 04/02/2019   NA 140 04/02/2019   K 4.0 04/02/2019    CL 104 04/02/2019   CREATININE 0.61 04/02/2019   BUN 17 04/02/2019   CO2 30 04/02/2019   TSH 0.62 10/04/2019   INR 0.95 03/03/2017   HGBA1C 6.0 10/04/2019       Assessment & Plan:

## 2019-10-04 NOTE — Patient Instructions (Signed)

## 2019-10-05 ENCOUNTER — Encounter: Payer: Self-pay | Admitting: Internal Medicine

## 2019-10-05 DIAGNOSIS — M8580 Other specified disorders of bone density and structure, unspecified site: Secondary | ICD-10-CM | POA: Insufficient documentation

## 2019-10-05 DIAGNOSIS — E559 Vitamin D deficiency, unspecified: Secondary | ICD-10-CM | POA: Insufficient documentation

## 2019-10-05 NOTE — Assessment & Plan Note (Signed)
stable overall by history and exam, recent data reviewed with pt, and pt to continue medical treatment as before,  to f/u any worsening symptoms or concerns  

## 2019-10-05 NOTE — Assessment & Plan Note (Signed)
Ok to start vit d 2000 u qd 

## 2019-10-05 NOTE — Assessment & Plan Note (Signed)
With recent abnormal tsh, for f/u tft's , cont same tx

## 2019-10-05 NOTE — Assessment & Plan Note (Signed)
D/w pt recent dxa, ok to start the fosamax for total 5 yrs

## 2019-10-08 ENCOUNTER — Other Ambulatory Visit: Payer: Self-pay

## 2019-10-08 DIAGNOSIS — Z20822 Contact with and (suspected) exposure to covid-19: Secondary | ICD-10-CM

## 2019-10-09 LAB — NOVEL CORONAVIRUS, NAA: SARS-CoV-2, NAA: NOT DETECTED

## 2019-10-10 ENCOUNTER — Encounter: Payer: Self-pay | Admitting: Internal Medicine

## 2019-11-15 ENCOUNTER — Other Ambulatory Visit: Payer: Self-pay | Admitting: Internal Medicine

## 2019-11-15 NOTE — Telephone Encounter (Signed)
Please refill as per office routine med refill policy (all routine meds refilled for 3 mo or monthly per pt preference up to one year from last visit, then month to month grace period for 3 mo, then further med refills will have to be denied)  

## 2019-11-18 ENCOUNTER — Encounter: Payer: Self-pay | Admitting: Internal Medicine

## 2019-11-19 MED ORDER — IBUPROFEN 800 MG PO TABS: 800.0000 mg | ORAL_TABLET | ORAL | 0 refills | Status: DC | PRN

## 2019-12-14 ENCOUNTER — Other Ambulatory Visit: Payer: Self-pay | Admitting: Internal Medicine

## 2019-12-14 DIAGNOSIS — E039 Hypothyroidism, unspecified: Secondary | ICD-10-CM

## 2019-12-14 NOTE — Telephone Encounter (Signed)
Please refill as per office routine med refill policy (all routine meds refilled for 3 mo or monthly per pt preference up to one year from last visit, then month to month grace period for 3 mo, then further med refills will have to be denied)  

## 2020-01-22 ENCOUNTER — Other Ambulatory Visit: Payer: Self-pay | Admitting: Internal Medicine

## 2020-01-22 DIAGNOSIS — L309 Dermatitis, unspecified: Secondary | ICD-10-CM

## 2020-01-22 NOTE — Telephone Encounter (Signed)
Please refill as per office routine med refill policy (all routine meds refilled for 3 mo or monthly per pt preference up to one year from last visit, then month to month grace period for 3 mo, then further med refills will have to be denied)  

## 2020-01-28 DIAGNOSIS — M13842 Other specified arthritis, left hand: Secondary | ICD-10-CM | POA: Diagnosis not present

## 2020-01-28 DIAGNOSIS — M13841 Other specified arthritis, right hand: Secondary | ICD-10-CM | POA: Diagnosis not present

## 2020-02-12 ENCOUNTER — Other Ambulatory Visit: Payer: Self-pay | Admitting: Internal Medicine

## 2020-03-13 ENCOUNTER — Other Ambulatory Visit: Payer: Self-pay | Admitting: Internal Medicine

## 2020-03-13 DIAGNOSIS — E039 Hypothyroidism, unspecified: Secondary | ICD-10-CM

## 2020-03-13 NOTE — Telephone Encounter (Signed)
,  Please refill as per office routine med refill policy (all routine meds refilled for 3 mo or monthly per pt preference up to one year from last visit, then month to month grace period for 3 mo, then further med refills will have to be denied)  

## 2020-03-15 ENCOUNTER — Other Ambulatory Visit: Payer: Self-pay | Admitting: Internal Medicine

## 2020-03-15 DIAGNOSIS — E039 Hypothyroidism, unspecified: Secondary | ICD-10-CM

## 2020-03-15 NOTE — Telephone Encounter (Signed)
Please refill as per office routine med refill policy (all routine meds refilled for 3 mo or monthly per pt preference up to one year from last visit, then month to month grace period for 3 mo, then further med refills will have to be denied)  

## 2020-04-03 ENCOUNTER — Ambulatory Visit: Payer: Medicare Other | Admitting: Internal Medicine

## 2020-04-27 ENCOUNTER — Ambulatory Visit: Payer: Medicare Other | Admitting: Internal Medicine

## 2020-05-01 ENCOUNTER — Ambulatory Visit (INDEPENDENT_AMBULATORY_CARE_PROVIDER_SITE_OTHER): Payer: Medicare Other

## 2020-05-01 ENCOUNTER — Other Ambulatory Visit: Payer: Self-pay

## 2020-05-01 ENCOUNTER — Ambulatory Visit (INDEPENDENT_AMBULATORY_CARE_PROVIDER_SITE_OTHER): Payer: Medicare Other | Admitting: Internal Medicine

## 2020-05-01 VITALS — BP 124/70 | Temp 98.4°F | Ht 66.0 in | Wt 278.4 lb

## 2020-05-01 VITALS — BP 124/70 | HR 63 | Temp 98.4°F | Ht 66.0 in | Wt 278.0 lb

## 2020-05-01 DIAGNOSIS — Z6841 Body Mass Index (BMI) 40.0 and over, adult: Secondary | ICD-10-CM

## 2020-05-01 DIAGNOSIS — Z0000 Encounter for general adult medical examination without abnormal findings: Secondary | ICD-10-CM

## 2020-05-01 DIAGNOSIS — R7309 Other abnormal glucose: Secondary | ICD-10-CM

## 2020-05-01 LAB — LIPID PANEL
Cholesterol: 205 mg/dL — ABNORMAL HIGH (ref 0–200)
HDL: 84.2 mg/dL (ref >39.00)
LDL Cholesterol: 109 mg/dL — ABNORMAL HIGH (ref 0–99)
NonHDL: 120.62
Total CHOL/HDL Ratio: 2
Triglycerides: 57 mg/dL (ref 0.0–149.0)
VLDL: 11.4 mg/dL (ref 0.0–40.0)

## 2020-05-01 LAB — BASIC METABOLIC PANEL
BUN: 16 mg/dL (ref 6–23)
CO2: 29 mEq/L (ref 19–32)
Calcium: 9 mg/dL (ref 8.4–10.5)
Chloride: 104 mEq/L (ref 96–112)
Creatinine, Ser: 0.74 mg/dL (ref 0.40–1.20)
GFR: 95.47 mL/min (ref >60.00)
Glucose, Bld: 103 mg/dL — ABNORMAL HIGH (ref 70–99)
Potassium: 3.9 mEq/L (ref 3.5–5.1)
Sodium: 140 mEq/L (ref 135–145)

## 2020-05-01 LAB — HEPATIC FUNCTION PANEL
ALT: 16 U/L (ref 0–35)
AST: 18 U/L (ref 0–37)
Albumin: 3.9 g/dL (ref 3.5–5.2)
Alkaline Phosphatase: 137 U/L — ABNORMAL HIGH (ref 39–117)
Bilirubin, Direct: 0.1 mg/dL (ref 0.0–0.3)
Total Bilirubin: 0.3 mg/dL (ref 0.2–1.2)
Total Protein: 7.2 g/dL (ref 6.0–8.3)

## 2020-05-01 LAB — HEMOGLOBIN A1C: Hgb A1c MFr Bld: 6.2 % (ref 4.6–6.5)

## 2020-05-01 LAB — CBC WITH DIFFERENTIAL/PLATELET
Basophils Absolute: 0.1 10*3/uL (ref 0.0–0.1)
Basophils Relative: 1.4 % (ref 0.0–3.0)
Eosinophils Absolute: 0.1 10*3/uL (ref 0.0–0.7)
Eosinophils Relative: 1.6 % (ref 0.0–5.0)
HCT: 38.1 % (ref 36.0–46.0)
Hemoglobin: 12.3 g/dL (ref 12.0–15.0)
Lymphocytes Relative: 23.7 % (ref 12.0–46.0)
Lymphs Abs: 1.9 10*3/uL (ref 0.7–4.0)
MCHC: 32.2 g/dL (ref 30.0–36.0)
MCV: 81.9 fl (ref 78.0–100.0)
Monocytes Absolute: 0.5 10*3/uL (ref 0.1–1.0)
Monocytes Relative: 6.6 % (ref 3.0–12.0)
Neutro Abs: 5.4 10*3/uL (ref 1.4–7.7)
Neutrophils Relative %: 66.7 % (ref 43.0–77.0)
Platelets: 267 10*3/uL (ref 150.0–400.0)
RBC: 4.65 Mil/uL (ref 3.87–5.11)
RDW: 14.9 % (ref 11.5–15.5)
WBC: 8.1 10*3/uL (ref 4.0–10.5)

## 2020-05-01 LAB — URINALYSIS, ROUTINE W REFLEX MICROSCOPIC
Bilirubin Urine: NEGATIVE
Hgb urine dipstick: NEGATIVE
Ketones, ur: NEGATIVE
Leukocytes,Ua: NEGATIVE
Nitrite: NEGATIVE
RBC / HPF: NONE SEEN (ref 0–?)
Specific Gravity, Urine: 1.02 (ref 1.000–1.030)
Total Protein, Urine: NEGATIVE
Urine Glucose: NEGATIVE
Urobilinogen, UA: 2 — AB (ref 0.0–1.0)
pH: 6.5 (ref 5.0–8.0)

## 2020-05-01 LAB — TSH: TSH: 0.28 u[IU]/mL — ABNORMAL LOW (ref 0.35–4.50)

## 2020-05-01 MED ORDER — PHENTERMINE HCL 37.5 MG PO CAPS: 37.5000 mg | ORAL_CAPSULE | ORAL | 2 refills | Status: DC

## 2020-05-01 MED ORDER — IBUPROFEN 800 MG PO TABS: 800.0000 mg | ORAL_TABLET | ORAL | 0 refills | Status: DC | PRN

## 2020-05-01 NOTE — Progress Notes (Signed)
Subjective:    Patient ID: Gina Ryan, female    DOB: 01/25/1956, 64 y.o.   MRN: [id number]  HPI  Here for wellness and f/u;  Overall doing ok;  Pt denies Chest pain, worsening SOB, DOE, wheezing, orthopnea, PND, worsening LE edema, palpitations, dizziness or syncope.  Pt denies neurological change such as new headache, facial or extremity weakness.  Pt denies polydipsia, polyuria, or low sugar symptoms. Pt states overall good compliance with treatment and medications, good tolerability, and has been trying to follow appropriate diet.  Pt denies worsening depressive symptoms, suicidal ideation or panic. No fever, night sweats, wt loss, loss of appetite, or other constitutional symptoms.  Pt states good ability with ADL's, has low fall risk, home safety reviewed and adequate, no other significant changes in hearing or vision, and only occasionally active with exercise. Wt Readings from Last 3 Encounters:  05/01/20 278 lb 6.4 oz (126.3 kg)  10/04/19 287 lb (130.2 kg)  09/16/19 290 lb (131.5 kg)   Past Medical History:  Diagnosis Date  . Atypical chest pain   . Blood transfusion without reported diagnosis   . GERD (gastroesophageal reflux disease)   . Hypertension   . Morbid obesity ([place])   . Osteoarthritis, knee   . Thyroid disease    Past Surgical History:  Procedure Laterality Date  . CHOLECYSTECTOMY    . COSMETIC SURGERY  07/2010   (lower facelift, lower blepharoplasty)  . GASTRIC BYPASS    . LAPAROSCOPIC GASTRIC BANDING    . ROTATOR CUFF REPAIR Bilateral   . TOTAL ABDOMINAL HYSTERECTOMY    . TOTAL KNEE ARTHROPLASTY Left 10/28/2016   Procedure: LEFT TOTAL KNEE ARTHROPLASTY;  Surgeon: Sydnee Cabal, MD;  Location: [facility];  Service: Orthopedics;  Laterality: Left;  . TOTAL KNEE ARTHROPLASTY Right 03/10/2017   Procedure: RIGHT TOTAL KNEE ARTHROPLASTY;  Surgeon: Sydnee Cabal, MD;  Location: [facility];  Service: Orthopedics;  Laterality: Right;    reports that she has never smoked.  She has never used smokeless tobacco. She reports that she does not drink alcohol and does not use drugs. family history includes Cancer in her brother; Colon cancer in her maternal uncle; Colon cancer (age of onset: 46) in her brother; Hypertension in her mother; Prostate cancer in her brother; Stroke in her mother. Allergies  Allergen Reactions  . Vicodin [Hydrocodone-Acetaminophen] Shortness Of Breath    REACTION: Shortness of Breath   . Hydrocodone-Acetaminophen     REACTION: Shortness of Breath  . Valsartan     Eye redness. No current issues with losartan Eye redness. No current issues with losartan   Current Outpatient Medications on File Prior to Visit  Medication Sig Dispense Refill  . alendronate (FOSAMAX) 70 MG tablet Take 1 tablet (70 mg total) by mouth every 7 (seven) days. Take with a full glass of water on an empty stomach. 12 tablet 3  . famotidine (PEPCID) 20 MG tablet Take 1 tablet (20 mg total) by mouth 2 (two) times daily. 180 tablet 3  . levothyroxine (SYNTHROID) 50 MCG tablet TAKE 1 TABLET BY MOUTH DAILY BEFORE BREAKFAST 90 tablet 0  . losartan-hydrochlorothiazide (HYZAAR) 100-12.5 MG tablet Take 1 tablet by mouth daily. Annual appt due in [place] must see provider for future refills 90 tablet 0  . methocarbamol (ROBAXIN) 500 MG tablet Take 1 tablet (500 mg total) by mouth every 8 (eight) hours as needed for muscle spasms. 20 tablet 0  . pantoprazole (PROTONIX) 40 MG tablet Take 1 tablet (40  mg total) by mouth 2 (two) times daily before a meal. 180 tablet 3  . triamcinolone (KENALOG) 0.025 % ointment APPLY EXTERNALLY TO THE AFFECTED AREA TWICE DAILY 80 g 1  . Vitamin D, Ergocalciferol, (DRISDOL) 1.25 MG (50000 UT) CAPS capsule Take 1 capsule (50,000 Units total) by mouth every 7 (seven) days. 12 capsule 0  . [DISCONTINUED] potassium chloride (K-DUR) 10 MEQ tablet Take 1 tablet (10 mEq total) by mouth daily. 90 tablet 0   No current facility-administered medications on file  prior to visit.   Review of Systems All otherwise neg per pt     Objective:   Physical Exam  BP 124/70   Pulse 63   Temp 98.4 F (36.9 C)   Ht 5\' 6"  (1.676 m)   Wt 278 lb (126.1 kg)   SpO2 95%   BMI 44.87 kg/m  VS noted,  Constitutional: Pt appears in NAD HENT: Head: NCAT.  Right Ear: External ear normal.  Left Ear: External ear normal.  Eyes: . Pupils are equal, round, and reactive to light. Conjunctivae and EOM are normal Nose: without d/c or deformity Neck: Neck supple. Gross normal ROM Cardiovascular: Normal rate and regular rhythm.   Pulmonary/Chest: Effort normal and breath sounds without rales or wheezing.  Abd:  Soft, NT, ND, + BS, no organomegaly Neurological: Pt is alert. At baseline orientation, motor grossly intact Skin: Skin is warm. No rashes, other new lesions, no LE edema Psychiatric: Pt behavior is normal without agitation  All otherwise neg per pt Lab Results  Component Value Date   WBC 8.1 05/01/2020   HGB 12.3 05/01/2020   HCT 38.1 05/01/2020   PLT 267.0 05/01/2020   GLUCOSE 103 (H) 05/01/2020   CHOL 205 (H) 05/01/2020   TRIG 57.0 05/01/2020   HDL 84.20 05/01/2020   LDLDIRECT 99.0 12/15/2008   LDLCALC 109 (H) 05/01/2020   ALT 16 05/01/2020   AST 18 05/01/2020   NA 140 05/01/2020   K 3.9 05/01/2020   CL 104 05/01/2020   CREATININE 0.74 05/01/2020   BUN 16 05/01/2020   CO2 29 05/01/2020   TSH 0.28 (L) 05/01/2020   INR 0.95 03/03/2017   HGBA1C 6.2 05/01/2020       Assessment & Plan:

## 2020-05-01 NOTE — Patient Instructions (Addendum)
Gina Ryan , Thank you for taking time to come for your Medicare Wellness Visit. I appreciate your ongoing commitment to your health goals. Please review the following plan we discussed and let me know if I can assist you in the future.   Screening recommendations/referrals: Colonoscopy: 06/25/2018; due every 10 years Mammogram: 06/05/2015; overdue; due every year; please schedule for 2021 Bone Density: 09/17/2019 Recommended yearly ophthalmology/optometry visit for glaucoma screening and checkup Recommended yearly dental visit for hygiene and checkup  Vaccinations: Influenza vaccine: 08/24/2019 Pneumococcal vaccine: not completed Tdap vaccine: 03/27/2018; due every 10 years Shingles vaccine: never done  Covid-19: never done  Advanced directives: Advance directive discussed with you today. Even though you declined this today please call our office should you change your mind and we can give you the proper paperwork for you to fill out.  Conditions/risks identified: Yes. Please continue to do your personal lifestyle choices by: daily care of teeth and gums, regular physical activity (goal should be 5 days a week for 30 minutes), eat a healthy diet, avoid tobacco and drug use, limiting any alcohol intake, taking a low-dose aspirin (if not allergic or have been advised by your provider otherwise) and taking vitamins and minerals as recommended by your provider. Continue doing brain stimulating activities (puzzles, reading, adult coloring books, staying active) to keep memory sharp. Continue to eat heart healthy diet (full of fruits, vegetables, whole grains, lean protein, water--limit salt, fat, and sugar intake) and increase physical activity as tolerated.  Next appointment: Please schedule your next Medicare Wellness Visit with your Nurse Health Advisor in 1 year.  Preventive Care 40-64 Years, Female Preventive care refers to lifestyle choices and visits with your health care provider that can  promote health and wellness. What does preventive care include?  A yearly physical exam. This is also called an annual well check.  Dental exams once or twice a year.  Routine eye exams. Ask your health care provider how often you should have your eyes checked.  Personal lifestyle choices, including:  Daily care of your teeth and gums.  Regular physical activity.  Eating a healthy diet.  Avoiding tobacco and drug use.  Limiting alcohol use.  Practicing safe sex.  Taking low-dose aspirin daily starting at age 83.  Taking vitamin and mineral supplements as recommended by your health care provider. What happens during an annual well check? The services and screenings done by your health care provider during your annual well check will depend on your age, overall health, lifestyle risk factors, and family history of disease. Counseling  Your health care provider may ask you questions about your:  Alcohol use.  Tobacco use.  Drug use.  Emotional well-being.  Home and relationship well-being.  Sexual activity.  Eating habits.  Work and work Statistician.  Method of birth control.  Menstrual cycle.  Pregnancy history. Screening  You may have the following tests or measurements:  Height, weight, and BMI.  Blood pressure.  Lipid and cholesterol levels. These may be checked every 5 years, or more frequently if you are over 49 years old.  Skin check.  Lung cancer screening. You may have this screening every year starting at age 19 if you have a 30-pack-year history of smoking and currently smoke or have quit within the past 15 years.  Fecal occult blood test (FOBT) of the stool. You may have this test every year starting at age 50.  Flexible sigmoidoscopy or colonoscopy. You may have a sigmoidoscopy every 5 years or  a colonoscopy every 10 years starting at age 3.  Hepatitis C blood test.  Hepatitis B blood test.  Sexually transmitted disease (STD)  testing.  Diabetes screening. This is done by checking your blood sugar (glucose) after you have not eaten for a while (fasting). You may have this done every 1-3 years.  Mammogram. This may be done every 1-2 years. Talk to your health care provider about when you should start having regular mammograms. This may depend on whether you have a family history of breast cancer.  BRCA-related cancer screening. This may be done if you have a family history of breast, ovarian, tubal, or peritoneal cancers.  Pelvic exam and Pap test. This may be done every 3 years starting at age 66. Starting at age 71, this may be done every 5 years if you have a Pap test in combination with an HPV test.  Bone density scan. This is done to screen for osteoporosis. You may have this scan if you are at high risk for osteoporosis. Discuss your test results, treatment options, and if necessary, the need for more tests with your health care provider. Vaccines  Your health care provider may recommend certain vaccines, such as:  Influenza vaccine. This is recommended every year.  Tetanus, diphtheria, and acellular pertussis (Tdap, Td) vaccine. You may need a Td booster every 10 years.  Zoster vaccine. You may need this after age 49.  Pneumococcal 13-valent conjugate (PCV13) vaccine. You may need this if you have certain conditions and were not previously vaccinated.  Pneumococcal polysaccharide (PPSV23) vaccine. You may need one or two doses if you smoke cigarettes or if you have certain conditions. Talk to your health care provider about which screenings and vaccines you need and how often you need them. This information is not intended to replace advice given to you by your health care provider. Make sure you discuss any questions you have with your health care provider. Document Released: 11/13/2015 Document Revised: 07/06/2016 Document Reviewed: 08/18/2015 Elsevier Interactive Patient Education  2017 [place] Prevention in the Home Falls can cause injuries. They can happen to people of all ages. There are many things you can do to make your home safe and to help prevent falls. What can I do on the outside of my home?  Regularly fix the edges of walkways and driveways and fix any cracks.  Remove anything that might make you trip as you walk through a door, such as a raised step or threshold.  Trim any bushes or trees on the path to your home.  Use bright outdoor lighting.  Clear any walking paths of anything that might make someone trip, such as rocks or tools.  Regularly check to see if handrails are loose or broken. Make sure that both sides of any steps have handrails.  Any raised decks and porches should have guardrails on the edges.  Have any leaves, snow, or ice cleared regularly.  Use sand or salt on walking paths during winter.  Clean up any spills in your garage right away. This includes oil or grease spills. What can I do in the bathroom?  Use night lights.  Install grab bars by the toilet and in the tub and shower. Do not use towel bars as grab bars.  Use non-skid mats or decals in the tub or shower.  If you need to sit down in the shower, use a plastic, non-slip stool.  Keep the floor dry. Clean up any  water that spills on the floor as soon as it happens.  Remove soap buildup in the tub or shower regularly.  Attach bath mats securely with double-sided non-slip rug tape.  Do not have throw rugs and other things on the floor that can make you trip. What can I do in the bedroom?  Use night lights.  Make sure that you have a light by your bed that is easy to reach.  Do not use any sheets or blankets that are too big for your bed. They should not hang down onto the floor.  Have a firm chair that has side arms. You can use this for support while you get dressed.  Do not have throw rugs and other things on the floor that can make you trip. What can I  do in the kitchen?  Clean up any spills right away.  Avoid walking on wet floors.  Keep items that you use a lot in easy-to-reach places.  If you need to reach something above you, use a strong step stool that has a grab bar.  Keep electrical cords out of the way.  Do not use floor polish or wax that makes floors slippery. If you must use wax, use non-skid floor wax.  Do not have throw rugs and other things on the floor that can make you trip. What can I do with my stairs?  Do not leave any items on the stairs.  Make sure that there are handrails on both sides of the stairs and use them. Fix handrails that are broken or loose. Make sure that handrails are as long as the stairways.  Check any carpeting to make sure that it is firmly attached to the stairs. Fix any carpet that is loose or worn.  Avoid having throw rugs at the top or bottom of the stairs. If you do have throw rugs, attach them to the floor with carpet tape.  Make sure that you have a light switch at the top of the stairs and the bottom of the stairs. If you do not have them, ask someone to add them for you. What else can I do to help prevent falls?  Wear shoes that:  Do not have high heels.  Have rubber bottoms.  Are comfortable and fit you well.  Are closed at the toe. Do not wear sandals.  If you use a stepladder:  Make sure that it is fully opened. Do not climb a closed stepladder.  Make sure that both sides of the stepladder are locked into place.  Ask someone to hold it for you, if possible.  Clearly mark and make sure that you can see:  Any grab bars or handrails.  First and last steps.  Where the edge of each step is.  Use tools that help you move around (mobility aids) if they are needed. These include:  Canes.  Walkers.  Scooters.  Crutches.  Turn on the lights when you go into a dark area. Replace any light bulbs as soon as they burn out.  Set up your furniture so you have a  clear path. Avoid moving your furniture around.  If any of your floors are uneven, fix them.  If there are any pets around you, be aware of where they are.  Review your medicines with your doctor. Some medicines can make you feel dizzy. This can increase your chance of falling. Ask your doctor what other things that you can do to help prevent falls. This information  is not intended to replace advice given to you by your health care provider. Make sure you discuss any questions you have with your health care provider. Document Released: 08/13/2009 Document Revised: 03/24/2016 Document Reviewed: 11/21/2014 Elsevier Interactive Patient Education  2017 [employer].

## 2020-05-01 NOTE — Progress Notes (Signed)
Subjective:   Gina Ryan is a 64 y.o. female who presents for Medicare Annual (Subsequent) preventive examination.  Review of Systems    No ROS. Medicare Wellness Visit. Additional risk factors are reflected in social history. Cardiac Risk Factors include: family history of premature cardiovascular disease;hypertension;obesity (BMI >30kg/m2)       Objective:    Today's Vitals   05/01/20 1409  BP: 124/70  Temp: 98.4 F (36.9 C)  Weight: 278 lb 6.4 oz (126.3 kg)  Height: 5\' 6"  (1.676 m)  PainSc: 0-No pain   Body mass index is 44.93 kg/m.  Advanced Directives 08/01/2018 03/10/2017 03/03/2017 10/28/2016 10/21/2016 02/22/2015  Does Patient Have a Medical Advance Directive? No Yes Yes Yes Yes No  Type of Advance Directive - Living will Living will Living will Living will -  Does patient want to make changes to medical advance directive? - No - Patient declined No - Patient declined No - Patient declined - -  Would patient like information on creating a medical advance directive? No - Patient declined - - - - -    Current Medications (verified) Outpatient Encounter Medications as of 05/01/2020  Medication Sig  . famotidine (PEPCID) 20 MG tablet Take 1 tablet (20 mg total) by mouth 2 (two) times daily.  Marland Kitchen ibuprofen (ADVIL) 800 MG tablet Take 1 tablet (800 mg total) by mouth as needed.  Marland Kitchen levothyroxine (SYNTHROID) 50 MCG tablet TAKE 1 TABLET BY MOUTH DAILY BEFORE BREAKFAST  . losartan-hydrochlorothiazide (HYZAAR) 100-12.5 MG tablet Take 1 tablet by mouth daily. Annual appt due in [place] must see provider for future refills  . pantoprazole (PROTONIX) 40 MG tablet Take 1 tablet (40 mg total) by mouth 2 (two) times daily before a meal.  . triamcinolone (KENALOG) 0.025 % ointment APPLY EXTERNALLY TO THE AFFECTED AREA TWICE DAILY  . alendronate (FOSAMAX) 70 MG tablet Take 1 tablet (70 mg total) by mouth every 7 (seven) days. Take with a full glass of water on an empty stomach.  .  methocarbamol (ROBAXIN) 500 MG tablet Take 1 tablet (500 mg total) by mouth every 8 (eight) hours as needed for muscle spasms.  . phentermine 37.5 MG capsule Take 1 capsule (37.5 mg total) by mouth every morning.  . Vitamin D, Ergocalciferol, (DRISDOL) 1.25 MG (50000 UT) CAPS capsule Take 1 capsule (50,000 Units total) by mouth every 7 (seven) days.  . [DISCONTINUED] potassium chloride (K-DUR) 10 MEQ tablet Take 1 tablet (10 mEq total) by mouth daily.   No facility-administered encounter medications on file as of 05/01/2020.    Allergies (verified) Vicodin [hydrocodone-acetaminophen], Hydrocodone-acetaminophen, and Valsartan   History: Past Medical History:  Diagnosis Date  . Atypical chest pain   . Blood transfusion without reported diagnosis   . GERD (gastroesophageal reflux disease)   . Hypertension   . Morbid obesity ([place])   . Osteoarthritis, knee   . Thyroid disease    Past Surgical History:  Procedure Laterality Date  . CHOLECYSTECTOMY    . COSMETIC SURGERY  07/2010   (lower facelift, lower blepharoplasty)  . GASTRIC BYPASS    . LAPAROSCOPIC GASTRIC BANDING    . ROTATOR CUFF REPAIR Bilateral   . TOTAL ABDOMINAL HYSTERECTOMY    . TOTAL KNEE ARTHROPLASTY Left 10/28/2016   Procedure: LEFT TOTAL KNEE ARTHROPLASTY;  Surgeon: Sydnee Cabal, MD;  Location: [facility];  Service: Orthopedics;  Laterality: Left;  . TOTAL KNEE ARTHROPLASTY Right 03/10/2017   Procedure: RIGHT TOTAL KNEE ARTHROPLASTY;  Surgeon: Sydnee Cabal, MD;  Location: [facility];  Service: Orthopedics;  Laterality: Right;   Family History  Problem Relation Age of Onset  . Stroke Mother   . Hypertension Mother   . Cancer Brother        diagnosed in late 11's  . Colon cancer Brother 82  . Prostate cancer Brother   . Colon cancer Maternal Uncle   . Esophageal cancer Neg Hx   . Rectal cancer Neg Hx   . Stomach cancer Neg Hx    Social History   Socioeconomic History  . Marital status: Single    Spouse name: Not  on file  . Number of children: Not on file  . Years of education: Not on file  . Highest education level: Not on file  Occupational History  . 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 Smoking status: Never Smoker  . Smokeless tobacco: Never Used  Vaping Use  . Vaping Use: Never used  Substance and Sexual Activity  . Alcohol use: No  . Drug use: No  . Sexual activity: Not Currently  Other Topics Concern  . Not on file  Social History Narrative   Lives alone   Daugher , son and 4 grand children live in the are      Widowed - husband died 37 yrs ago   Social Determinants of Radio broadcast assistant Strain:   . Difficulty of Paying Living Expenses:   Food Insecurity:   . Worried About Charity fundraiser in the Last Year:   . Arboriculturist in the Last Year:   Transportation Needs:   . Film/video editor (Medical):   Marland Kitchen Lack of Transportation (Non-Medical):   Physical Activity:   . Days of Exercise per Week:   . Minutes of Exercise per Session:   Stress:   . Feeling of Stress :   Social Connections:   . Frequency of Communication with Friends and Family:   . Frequency of Social Gatherings with Friends and Family:   . Attends Religious Services:   . Active Member of Clubs or Organizations:   . Attends Archivist Meetings:   Marland Kitchen Marital Status:     Tobacco Counseling Counseling given: No   Clinical Intake:  Pre-visit preparation completed: Yes  Pain : No/denies pain Pain Score: 0-No pain     BMI - recorded: 44.93 Nutritional Risks: Nausea/ vomitting/ diarrhea Diabetes: No     Diabetic? no         Activities of Daily Living No flowsheet data found.  Patient Care Team: Biagio Borg, MD as PCP - General (Internal Medicine)  Indicate any recent Medical Services you may have received from other than [facility] providers in the past year (date may be approximate).     Assessment:   This is a  routine wellness examination for Tarrie.  Hearing/Vision screen No exam data present  Dietary issues and exercise activities discussed:    Goals   None    Depression Screen PHQ 2/9 Scores 04/02/2019 12/18/2017  PHQ - 2 Score 0 0    Fall Risk Fall Risk  04/02/2019 12/18/2017  Falls in the past year? 0 No    Any stairs in or around the home? No  If so, are there any without handrails? No  Home free of loose throw rugs in walkways, pet beds, electrical cords, etc? Yes  Adequate lighting in your home to reduce risk of falls?  Yes   ASSISTIVE DEVICES UTILIZED TO PREVENT FALLS:  Life alert? No  Use of a cane, walker or w/c? Yes  Grab bars in the bathroom? Yes  Shower chair or bench in shower? Yes  Elevated toilet seat or a handicapped toilet? Yes   TIMED UP AND GO:  Was the test performed? No .  Length of time to ambulate 10 feet: 0 sec.   Gait steady and fast without use of assistive device  Cognitive Function: not indicated        Immunizations Immunization History  Administered Date(s) Administered  . Influenza Whole 09/05/2007, 08/07/2009, 07/19/2010  . Influenza,inj,Quad PF,6+ Mos 11/18/2013, 08/03/2018, 08/24/2019  . Influenza-Unspecified 08/17/2016  . Td 09/17/2007  . Tdap 03/27/2018    TDAP status: Up to date Flu Vaccine status: Up to date Pneumococcal vaccine status: Declined,  Education has been provided regarding the importance of this vaccine but patient still declined. Advised may receive this vaccine at local pharmacy or Health Dept. Aware to provide a copy of the vaccination record if obtained from local pharmacy or Health Dept. Verbalized acceptance and understanding.  Covid-19 vaccine status: Completed vaccines  Qualifies for Shingles Vaccine? Yes   Zostavax completed No   Shingrix Completed?: No.    Education has been provided regarding the importance of this vaccine. Patient has been advised to call insurance company to determine out of pocket  expense if they have not yet received this vaccine. Advised may also receive vaccine at local pharmacy or Health Dept. Verbalized acceptance and understanding.  Screening Tests Health Maintenance  Topic Date Due  . COVID-19 Vaccine (1) Never done  . MAMMOGRAM  10/03/2020 (Originally 06/04/2017)  . INFLUENZA VACCINE  05/31/2020  . TETANUS/TDAP  03/27/2028  . COLONOSCOPY  06/25/2028  . Hepatitis C Screening  Completed  . HIV Screening  Completed  . PAP SMEAR-Modifier  Discontinued    Health Maintenance  Health Maintenance Due  Topic Date Due  . COVID-19 Vaccine (1) Never done    Colorectal cancer screening: Completed 06/25/2018. Repeat every 10 years Mammogram status: Completed 06/05/2015. Repeat every year Bone Density status: Completed 09/17/2019. Results reflect: Bone density results: NORMAL. Repeat every 8-10 years.  Lung Cancer Screening: (Low Dose CT Chest recommended if Age 21-80 years, 30 pack-year currently smoking OR have quit w/in 15years.) does not qualify.   Lung Cancer Screening Referral: no  Additional Screening:  Hepatitis C Screening: does qualify; Completed yes  Vision Screening: Recommended annual ophthalmology exams for early detection of glaucoma and other disorders of the eye. Is the patient up to date with their annual eye exam?  Yes  Who is the provider or what is the name of the office in which the patient attends annual eye exams? Marygrace Drought, MD If pt is not established with a provider, would they like to be referred to a provider to establish care? No .   Dental Screening: Recommended annual dental exams for proper oral hygiene  Community Resource Referral / Chronic Care Management: CRR required this visit?  No   CCM required this visit?  No      Plan:     I have personally reviewed and noted the following in the patient's chart:   . Medical and social history . Use of alcohol, tobacco or illicit drugs  . Current medications and  supplements . Functional ability and status . Nutritional status . Physical activity . Advanced directives . List of other physicians . Hospitalizations, surgeries, and ER visits in  previous 12 months . Vitals . Screenings to include cognitive, depression, and falls . Referrals and appointments  In addition, I have reviewed and discussed with patient certain preventive protocols, quality metrics, and best practice recommendations. A written personalized care plan for preventive services as well as general preventive health recommendations were provided to patient.     Sheral Flow, LPN   12/31/4399   Nurse Notes:  Patient is cogitatively intact.

## 2020-05-01 NOTE — Patient Instructions (Signed)
Please take all new medication as prescribed - the phentermine  Please continue all other medications as before - the ibuprofen  Please have the pharmacy call with any other refills you may need.  Please continue your efforts at being more active, low cholesterol diet, and weight control.  You are otherwise up to date with prevention measures today.  Please keep your appointments with your specialists as you may have planned  Please go to the LAB at the blood drawing area for the tests to be done  You will be contacted by phone if any changes need to be made immediately.  Otherwise, you will receive a letter about your results with an explanation, but please check with MyChart first.  Please remember to sign up for MyChart if you have not done so, as this will be important to you in the future with finding out test results, communicating by private email, and scheduling acute appointments online when needed.  Please make an Appointment to return in 6 months, or sooner if needed

## 2020-05-02 ENCOUNTER — Encounter: Payer: Self-pay | Admitting: Internal Medicine

## 2020-05-02 NOTE — Assessment & Plan Note (Signed)
stable overall by history and exam, recent data reviewed with pt, and pt to continue medical treatment as before,  to f/u any worsening symptoms or concerns  

## 2020-05-02 NOTE — Assessment & Plan Note (Signed)

## 2020-05-02 NOTE — Assessment & Plan Note (Signed)
[place] for phentermine asd,  to f/u any worsening symptoms or concerns

## 2020-05-07 ENCOUNTER — Other Ambulatory Visit: Payer: Self-pay | Admitting: Internal Medicine

## 2020-05-22 ENCOUNTER — Other Ambulatory Visit: Payer: Self-pay | Admitting: Internal Medicine

## 2020-05-22 DIAGNOSIS — Z1231 Encounter for screening mammogram for malignant neoplasm of breast: Secondary | ICD-10-CM

## 2020-05-27 ENCOUNTER — Ambulatory Visit: Payer: Medicare Other

## 2020-05-28 ENCOUNTER — Other Ambulatory Visit: Payer: Self-pay | Admitting: Internal Medicine

## 2020-05-28 DIAGNOSIS — E039 Hypothyroidism, unspecified: Secondary | ICD-10-CM

## 2020-05-28 NOTE — Telephone Encounter (Signed)
Please refill as per office routine med refill policy (all routine meds refilled for 3 mo or monthly per pt preference up to one year from last visit, then month to month grace period for 3 mo, then further med refills will have to be denied)  

## 2020-06-25 ENCOUNTER — Ambulatory Visit: Payer: Medicare Other

## 2020-07-09 ENCOUNTER — Other Ambulatory Visit: Payer: Self-pay

## 2020-07-09 ENCOUNTER — Ambulatory Visit
Admission: RE | Admit: 2020-07-09 | Discharge: 2020-07-09 | Disposition: A | Discharge status: Home / Self Care | Payer: Medicare Other | Source: Ambulatory Visit | Attending: Internal Medicine | Admitting: Internal Medicine

## 2020-07-09 DIAGNOSIS — Z1231 Encounter for screening mammogram for malignant neoplasm of breast: Secondary | ICD-10-CM

## 2020-07-09 DIAGNOSIS — M65312 Trigger thumb, left thumb: Secondary | ICD-10-CM | POA: Diagnosis not present

## 2020-07-09 DIAGNOSIS — M13842 Other specified arthritis, left hand: Secondary | ICD-10-CM | POA: Diagnosis not present

## 2020-07-09 DIAGNOSIS — M13841 Other specified arthritis, right hand: Secondary | ICD-10-CM | POA: Diagnosis not present

## 2020-07-10 ENCOUNTER — Ambulatory Visit: Payer: Medicare Other

## 2020-07-12 ENCOUNTER — Other Ambulatory Visit: Payer: Self-pay | Admitting: Internal Medicine

## 2020-07-12 DIAGNOSIS — E039 Hypothyroidism, unspecified: Secondary | ICD-10-CM

## 2020-07-25 ENCOUNTER — Other Ambulatory Visit: Payer: Self-pay | Admitting: Internal Medicine

## 2020-07-25 DIAGNOSIS — E039 Hypothyroidism, unspecified: Secondary | ICD-10-CM

## 2020-07-26 DIAGNOSIS — R531 Weakness: Secondary | ICD-10-CM | POA: Diagnosis not present

## 2020-07-26 DIAGNOSIS — R2990 Unspecified symptoms and signs involving the nervous system: Secondary | ICD-10-CM | POA: Diagnosis not present

## 2020-07-26 DIAGNOSIS — R202 Paresthesia of skin: Secondary | ICD-10-CM | POA: Diagnosis not present

## 2020-07-26 DIAGNOSIS — E782 Mixed hyperlipidemia: Secondary | ICD-10-CM | POA: Diagnosis not present

## 2020-07-26 DIAGNOSIS — Z79899 Other long term (current) drug therapy: Secondary | ICD-10-CM | POA: Diagnosis not present

## 2020-07-26 DIAGNOSIS — R200 Anesthesia of skin: Secondary | ICD-10-CM | POA: Diagnosis not present

## 2020-07-26 DIAGNOSIS — R002 Palpitations: Secondary | ICD-10-CM | POA: Diagnosis not present

## 2020-07-26 DIAGNOSIS — I10 Essential (primary) hypertension: Secondary | ICD-10-CM | POA: Diagnosis not present

## 2020-07-26 DIAGNOSIS — E785 Hyperlipidemia, unspecified: Secondary | ICD-10-CM | POA: Diagnosis not present

## 2020-07-26 DIAGNOSIS — R42 Dizziness and giddiness: Secondary | ICD-10-CM | POA: Diagnosis not present

## 2020-07-26 DIAGNOSIS — K219 Gastro-esophageal reflux disease without esophagitis: Secondary | ICD-10-CM | POA: Diagnosis not present

## 2020-07-26 DIAGNOSIS — E039 Hypothyroidism, unspecified: Secondary | ICD-10-CM | POA: Diagnosis not present

## 2020-07-26 DIAGNOSIS — R269 Unspecified abnormalities of gait and mobility: Secondary | ICD-10-CM | POA: Diagnosis not present

## 2020-07-26 DIAGNOSIS — Z743 Need for continuous supervision: Secondary | ICD-10-CM | POA: Diagnosis not present

## 2020-07-26 NOTE — Telephone Encounter (Signed)
Please refill as per office routine med refill policy (all routine meds refilled for 3 mo or monthly per pt preference up to one year from last visit, then month to month grace period for 3 mo, then further med refills will have to be denied)  

## 2020-07-27 DIAGNOSIS — I639 Cerebral infarction, unspecified: Secondary | ICD-10-CM | POA: Diagnosis not present

## 2020-07-27 DIAGNOSIS — I10 Essential (primary) hypertension: Secondary | ICD-10-CM | POA: Diagnosis not present

## 2020-07-27 DIAGNOSIS — R2990 Unspecified symptoms and signs involving the nervous system: Secondary | ICD-10-CM | POA: Diagnosis not present

## 2020-07-28 DIAGNOSIS — R2990 Unspecified symptoms and signs involving the nervous system: Secondary | ICD-10-CM | POA: Diagnosis not present

## 2020-09-22 ENCOUNTER — Ambulatory Visit (INDEPENDENT_AMBULATORY_CARE_PROVIDER_SITE_OTHER): Payer: Medicare Other | Admitting: Internal Medicine

## 2020-09-22 ENCOUNTER — Other Ambulatory Visit: Payer: Self-pay

## 2020-09-22 ENCOUNTER — Encounter: Payer: Self-pay | Admitting: Internal Medicine

## 2020-09-22 DIAGNOSIS — E7849 Other hyperlipidemia: Secondary | ICD-10-CM

## 2020-09-22 DIAGNOSIS — M19049 Primary osteoarthritis, unspecified hand: Secondary | ICD-10-CM | POA: Diagnosis not present

## 2020-09-22 DIAGNOSIS — G459 Transient cerebral ischemic attack, unspecified: Secondary | ICD-10-CM

## 2020-09-22 DIAGNOSIS — L680 Hirsutism: Secondary | ICD-10-CM | POA: Diagnosis not present

## 2020-09-22 DIAGNOSIS — M5431 Sciatica, right side: Secondary | ICD-10-CM | POA: Insufficient documentation

## 2020-09-22 DIAGNOSIS — R7309 Other abnormal glucose: Secondary | ICD-10-CM

## 2020-09-22 DIAGNOSIS — E785 Hyperlipidemia, unspecified: Secondary | ICD-10-CM | POA: Insufficient documentation

## 2020-09-22 DIAGNOSIS — L258 Unspecified contact dermatitis due to other agents: Secondary | ICD-10-CM | POA: Diagnosis not present

## 2020-09-22 HISTORY — DX: Transient cerebral ischemic attack, unspecified: G45.9

## 2020-09-22 MED ORDER — ATORVASTATIN CALCIUM 20 MG PO TABS: 20.0000 mg | ORAL_TABLET | Freq: Every day | ORAL | 3 refills | Status: DC

## 2020-09-22 MED ORDER — TRAMADOL HCL 50 MG PO TABS: 50.0000 mg | ORAL_TABLET | Freq: Four times a day (QID) | ORAL | 0 refills | Status: DC | PRN

## 2020-09-22 MED ORDER — ASPIRIN EC 81 MG PO TBEC: 81.0000 mg | DELAYED_RELEASE_TABLET | Freq: Every day | ORAL | 11 refills | Status: DC

## 2020-09-22 MED ORDER — MELOXICAM 15 MG PO TABS: 15.0000 mg | ORAL_TABLET | Freq: Every day | ORAL | 3 refills | Status: AC | PRN

## 2020-09-22 NOTE — Assessment & Plan Note (Addendum)
To d/c ibuprofen high dose, for limited rx, tramadol prn, stable overall by history and exam, recent data reviewed with pt, and pt to continue medical treatment as before,  to f/u any worsening symptoms or concerns

## 2020-09-22 NOTE — Patient Instructions (Addendum)
Please take all new medication as prescribed - the tramadol as needed for the sciatica, and the mobic as needed for other pain  Ok to stop the ibuprofen  Ok to start the lipitor 20 mg per day, and the aspirin 81 mg per day  Please continue all other medications as before, and refills have been done if requested.  Please have the pharmacy call with any other refills you may need.  Please continue your efforts at being more active, low cholesterol diet, and weight control  Please keep your appointments with your specialists as you may have planned  Please make an Appointment to return in 3 months

## 2020-09-22 NOTE — Progress Notes (Signed)
Subjective:    Patient ID: Gina Ryan, female    DOB: December 20, 1955, 64 y.o.   MRN: [id number]  HPI  Here to f/u; overall doing ok,  Pt denies chest pain, increasing sob or doe, wheezing, orthopnea, PND, increased LE swelling, palpitations, dizziness or syncope.  Pt denies polydipsia, polyuria, or low sugar episode.  Pt states overall good compliance with meds, mostly trying to follow appropriate diet, with wt overall stable,   Just back from 3 wk visit with daughter, was working out at [employer], then became dizzy, went to ED there, then hospd x 2 days with TIA, and d/c on asa/statin.  No other records available.  Also has recurring right sciatica symptoms of pain with occasional numbness but no weakness, pain uncontrolled, also has pain to hands not well controlled but has been better with mobic previously, just cant take it and the ibuprofen 800 prn at same time. Past Medical History:  Diagnosis Date  . Atypical chest pain   . Blood transfusion without reported diagnosis   . GERD (gastroesophageal reflux disease)   . Hypertension   . Morbid obesity ([place])   . Osteoarthritis, knee   . Thyroid disease   . TIA (transient ischemic attack) 09/22/2020   Past Surgical History:  Procedure Laterality Date  . CHOLECYSTECTOMY    . COSMETIC SURGERY  07/2010   (lower facelift, lower blepharoplasty)  . GASTRIC BYPASS    . LAPAROSCOPIC GASTRIC BANDING    . ROTATOR CUFF REPAIR Bilateral   . TOTAL ABDOMINAL HYSTERECTOMY    . TOTAL KNEE ARTHROPLASTY Left 10/28/2016   Procedure: LEFT TOTAL KNEE ARTHROPLASTY;  Surgeon: Sydnee Cabal, MD;  Location: [facility];  Service: Orthopedics;  Laterality: Left;  . TOTAL KNEE ARTHROPLASTY Right 03/10/2017   Procedure: RIGHT TOTAL KNEE ARTHROPLASTY;  Surgeon: Sydnee Cabal, MD;  Location: [facility];  Service: Orthopedics;  Laterality: Right;    reports that she has never smoked. She has never used smokeless tobacco. She reports that she does not drink alcohol and does  not use drugs. family history includes Cancer in her brother; Colon cancer in her maternal uncle; Colon cancer (age of onset: 71) in her brother; Hypertension in her mother; Prostate cancer in her brother; Stroke in her mother. Allergies  Allergen Reactions  . Iodinated Diagnostic Agents Shortness Of Breath  . Vicodin [Hydrocodone-Acetaminophen] Shortness Of Breath    REACTION: Shortness of Breath   . Hydrocodone-Acetaminophen     REACTION: Shortness of Breath  . Valsartan     Eye redness. No current issues with losartan Eye redness. No current issues with losartan   Current Outpatient Medications on File Prior to Visit  Medication Sig Dispense Refill  . famotidine (PEPCID) 20 MG tablet Take 1 tablet (20 mg total) by mouth 2 (two) times daily. 180 tablet 3  . levothyroxine (SYNTHROID) 50 MCG tablet TAKE 1 TABLET BY MOUTH DAILY BEFORE BREAKFAST 90 tablet 1  . losartan-hydrochlorothiazide (HYZAAR) 100-12.5 MG tablet TAKE 1 TABLET BY MOUTH DAILY 90 tablet 0  . pantoprazole (PROTONIX) 40 MG tablet TAKE 1 TABLET(40 MG) BY MOUTH TWICE DAILY BEFORE A MEAL 180 tablet 3  . phentermine 37.5 MG capsule Take 1 capsule (37.5 mg total) by mouth every morning. 30 capsule 2  . triamcinolone (KENALOG) 0.025 % ointment APPLY EXTERNALLY TO THE AFFECTED AREA TWICE DAILY 80 g 1  . [DISCONTINUED] potassium chloride (K-DUR) 10 MEQ tablet Take 1 tablet (10 mEq total) by mouth daily. 90 tablet 0  No current facility-administered medications on file prior to visit.   Review of Systems All otherwise neg per pt   Objective:   Physical Exam BP 120/70 (BP Location: Left Arm, Patient Position: Sitting, Cuff Size: Large)   Pulse 61   Temp 98.3 F (36.8 C) (Oral)   Ht 5\' 6"  (1.676 m)   Wt 280 lb (127 kg)   SpO2 96%   BMI 45.19 kg/m  VS noted,  Constitutional: Pt appears in NAD HENT: Head: NCAT.  Right Ear: External ear normal.  Left Ear: External ear normal.  Eyes: . Pupils are equal, round, and  reactive to light. Conjunctivae and EOM are normal Nose: without d/c or deformity Neck: Neck supple. Gross normal ROM Cardiovascular: Normal rate and regular rhythm.   Pulmonary/Chest: Effort normal and breath sounds without rales or wheezing.  Abd:  Soft, NT, ND, + BS, no organomegaly Neurological: Pt is alert. At baseline orientation, motor grossly intact Skin: Skin is warm. No rashes, other new lesions, no LE edema Psychiatric: Pt behavior is normal without agitation  All otherwise neg per pt Lab Results  Component Value Date   WBC 8.1 05/01/2020   HGB 12.3 05/01/2020   HCT 38.1 05/01/2020   PLT 267.0 05/01/2020   GLUCOSE 103 (H) 05/01/2020   CHOL 205 (H) 05/01/2020   TRIG 57.0 05/01/2020   HDL 84.20 05/01/2020   LDLDIRECT 99.0 12/15/2008   LDLCALC 109 (H) 05/01/2020   ALT 16 05/01/2020   AST 18 05/01/2020   NA 140 05/01/2020   K 3.9 05/01/2020   CL 104 05/01/2020   CREATININE 0.74 05/01/2020   BUN 16 05/01/2020   CO2 29 05/01/2020   TSH 0.28 (L) 05/01/2020   INR 0.95 03/03/2017   HGBA1C 6.2 05/01/2020      Assessment & Plan:

## 2020-09-27 ENCOUNTER — Encounter: Payer: Self-pay | Admitting: Internal Medicine

## 2020-09-27 NOTE — Assessment & Plan Note (Signed)
For lipitor 20 qd, low chol diet, f/u lab at next draw

## 2020-09-27 NOTE — Assessment & Plan Note (Signed)
stable overall by history and exam, recent data reviewed with pt, and pt to continue medical treatment as before,  to f/u any worsening symptoms or concerns  

## 2020-09-27 NOTE — Assessment & Plan Note (Signed)
Ok for mobic 15 qd prn

## 2020-09-27 NOTE — Assessment & Plan Note (Addendum)
stable overall by history and exam, recent data reviewed with pt, and pt to continue medical treatment as before,  to f/u any worsening symptoms or concerns, cont asa/statin  I spent 31 minutes in preparing to see the patient by review of recent labs, imaging and procedures, obtaining and reviewing separately obtained history, communicating with the patient and family or caregiver, ordering medications, tests or procedures, and documenting clinical information in the EHR including the differential Dx, treatment, and any further evaluation and other management of tia, hld, hyperglycemia, sciatica, hand djd

## 2020-10-23 ENCOUNTER — Other Ambulatory Visit: Payer: Self-pay | Admitting: Internal Medicine

## 2020-10-23 NOTE — Telephone Encounter (Signed)
Please refill as per office routine med refill policy (all routine meds refilled for 3 mo or monthly per pt preference up to one year from last visit, then month to month grace period for 3 mo, then further med refills will have to be denied)  

## 2020-12-07 ENCOUNTER — Other Ambulatory Visit: Payer: Self-pay | Admitting: Internal Medicine

## 2020-12-20 ENCOUNTER — Other Ambulatory Visit: Payer: Self-pay | Admitting: Internal Medicine

## 2020-12-20 NOTE — Telephone Encounter (Signed)
Please refill as per office routine med refill policy (all routine meds refilled for 3 mo or monthly per pt preference up to one year from last visit, then month to month grace period for 3 mo, then further med refills will have to be denied)  

## 2020-12-23 ENCOUNTER — Telehealth: Payer: Self-pay

## 2020-12-23 ENCOUNTER — Ambulatory Visit: Payer: Medicare Other | Admitting: Internal Medicine

## 2020-12-23 MED ORDER — FAMOTIDINE 20 MG PO TABS: 20.0000 mg | ORAL_TABLET | Freq: Two times a day (BID) | ORAL | 3 refills | Status: DC

## 2020-12-23 NOTE — Telephone Encounter (Signed)
Ok for pepcid + PPI

## 2021-02-15 ENCOUNTER — Encounter: Payer: Self-pay | Admitting: Internal Medicine

## 2021-02-15 ENCOUNTER — Other Ambulatory Visit: Payer: Self-pay

## 2021-02-15 ENCOUNTER — Ambulatory Visit (INDEPENDENT_AMBULATORY_CARE_PROVIDER_SITE_OTHER): Payer: Medicare Other | Admitting: Internal Medicine

## 2021-02-15 VITALS — BP 122/70 | HR 67 | Ht 66.0 in | Wt 281.0 lb

## 2021-02-15 DIAGNOSIS — R7309 Other abnormal glucose: Secondary | ICD-10-CM | POA: Diagnosis not present

## 2021-02-15 DIAGNOSIS — E559 Vitamin D deficiency, unspecified: Secondary | ICD-10-CM

## 2021-02-15 DIAGNOSIS — I10 Essential (primary) hypertension: Secondary | ICD-10-CM | POA: Diagnosis not present

## 2021-02-15 DIAGNOSIS — E7849 Other hyperlipidemia: Secondary | ICD-10-CM | POA: Diagnosis not present

## 2021-02-15 DIAGNOSIS — R7989 Other specified abnormal findings of blood chemistry: Secondary | ICD-10-CM | POA: Diagnosis not present

## 2021-02-15 DIAGNOSIS — Z23 Encounter for immunization: Secondary | ICD-10-CM | POA: Diagnosis not present

## 2021-02-15 DIAGNOSIS — E039 Hypothyroidism, unspecified: Secondary | ICD-10-CM | POA: Diagnosis not present

## 2021-02-15 DIAGNOSIS — E538 Deficiency of other specified B group vitamins: Secondary | ICD-10-CM

## 2021-02-15 DIAGNOSIS — Z0001 Encounter for general adult medical examination with abnormal findings: Secondary | ICD-10-CM | POA: Diagnosis not present

## 2021-02-15 DIAGNOSIS — Z6841 Body Mass Index (BMI) 40.0 and over, adult: Secondary | ICD-10-CM

## 2021-02-15 LAB — BASIC METABOLIC PANEL
BUN: 19 mg/dL (ref 6–23)
CO2: 30 mEq/L (ref 19–32)
Calcium: 8.9 mg/dL (ref 8.4–10.5)
Chloride: 105 mEq/L (ref 96–112)
Creatinine, Ser: 0.66 mg/dL (ref 0.40–1.20)
GFR: 92.16 mL/min (ref >60.00)
Glucose, Bld: 97 mg/dL (ref 70–99)
Potassium: 4.2 mEq/L (ref 3.5–5.1)
Sodium: 140 mEq/L (ref 135–145)

## 2021-02-15 LAB — HEPATIC FUNCTION PANEL
ALT: 16 U/L (ref 0–35)
AST: 18 U/L (ref 0–37)
Albumin: 3.8 g/dL (ref 3.5–5.2)
Alkaline Phosphatase: 137 U/L — ABNORMAL HIGH (ref 39–117)
Bilirubin, Direct: 0.1 mg/dL (ref 0.0–0.3)
Total Bilirubin: 0.3 mg/dL (ref 0.2–1.2)
Total Protein: 7.1 g/dL (ref 6.0–8.3)

## 2021-02-15 LAB — CBC WITH DIFFERENTIAL/PLATELET
Basophils Absolute: 0 10*3/uL (ref 0.0–0.1)
Basophils Relative: 0.5 % (ref 0.0–3.0)
Eosinophils Absolute: 0.1 10*3/uL (ref 0.0–0.7)
Eosinophils Relative: 1.6 % (ref 0.0–5.0)
HCT: 38.7 % (ref 36.0–46.0)
Hemoglobin: 12.3 g/dL (ref 12.0–15.0)
Lymphocytes Relative: 25.4 % (ref 12.0–46.0)
Lymphs Abs: 1.8 10*3/uL (ref 0.7–4.0)
MCHC: 31.9 g/dL (ref 30.0–36.0)
MCV: 82.1 fl (ref 78.0–100.0)
Monocytes Absolute: 0.5 10*3/uL (ref 0.1–1.0)
Monocytes Relative: 6.6 % (ref 3.0–12.0)
Neutro Abs: 4.8 10*3/uL (ref 1.4–7.7)
Neutrophils Relative %: 65.9 % (ref 43.0–77.0)
Platelets: 290 10*3/uL (ref 150.0–400.0)
RBC: 4.71 Mil/uL (ref 3.87–5.11)
RDW: 15.2 % (ref 11.5–15.5)
WBC: 7.2 10*3/uL (ref 4.0–10.5)

## 2021-02-15 LAB — VITAMIN B12: Vitamin B-12: 621 pg/mL (ref 211–911)

## 2021-02-15 LAB — T4, FREE: Free T4: 0.9 ng/dL (ref 0.60–1.60)

## 2021-02-15 LAB — LIPID PANEL
Cholesterol: 167 mg/dL (ref 0–200)
HDL: 79.5 mg/dL (ref >39.00)
LDL Cholesterol: 77 mg/dL (ref 0–99)
NonHDL: 87.77
Total CHOL/HDL Ratio: 2
Triglycerides: 53 mg/dL (ref 0.0–149.0)
VLDL: 10.6 mg/dL (ref 0.0–40.0)

## 2021-02-15 LAB — VITAMIN D 25 HYDROXY (VIT D DEFICIENCY, FRACTURES): VITD: 22.73 ng/mL — ABNORMAL LOW (ref 30.00–100.00)

## 2021-02-15 LAB — TSH: TSH: 0.3 u[IU]/mL — ABNORMAL LOW (ref 0.35–4.50)

## 2021-02-15 LAB — HEMOGLOBIN A1C: Hgb A1c MFr Bld: 6.3 % (ref 4.6–6.5)

## 2021-02-15 MED ORDER — PHENTERMINE HCL 37.5 MG PO CAPS: 37.5000 mg | ORAL_CAPSULE | ORAL | 2 refills | Status: DC

## 2021-02-15 NOTE — Patient Instructions (Signed)
You had the Prevnar 13 pneumonia shot today  Please take all new medication as prescribed - the phentermine  Please continue all other medications as before, and refills have been done if requested.  Please have the pharmacy call with any other refills you may need.  Please continue your efforts at being more active, low cholesterol diet, and weight control.  You are otherwise up to date with prevention measures today.  Please keep your appointments with your specialists as you may have planned  Please go to the LAB at the blood drawing area for the tests to be done  You will be contacted by phone if any changes need to be made immediately.  Otherwise, you will receive a letter about your results with an explanation, but please check with MyChart first.  Please remember to sign up for MyChart if you have not done so, as this will be important to you in the future with finding out test results, communicating by private email, and scheduling acute appointments online when needed.  Please make an Appointment to return in 6 months, or sooner if needed

## 2021-02-15 NOTE — Assessment & Plan Note (Signed)
Lab Results  Component Value Date   HGBA1C 6.3 02/15/2021   Stable, pt to continue current medical treatment  - diet

## 2021-02-15 NOTE — Assessment & Plan Note (Signed)
Lab Results  Component Value Date   TSH 0.30 (L) 02/15/2021   Stable, pt to continue levothyroxine

## 2021-02-15 NOTE — Assessment & Plan Note (Signed)
BP Readings from Last 3 Encounters:  02/15/21 122/70  09/22/20 120/70  05/01/20 124/70   Stable, pt to continue medical treatment hyzaar

## 2021-02-15 NOTE — Progress Notes (Signed)
Patient ID: Gina Ryan, female   DOB: May 13, 1956, 65 y.o.   MRN: [id number]         Chief Complaint:: wellness exam and Follow-up  obesity, hypothyroid, hld, hyperglycemia       HPI:  NASHLEY Ryan is a 65 y.o. female here for wellness exam; due for prevnar 13, declines covid booster, o/w up to date with preventive referrals and immunizations                        Also asks for phentermine restart as she lost about 50 lbs with last tx several yrs ago.  Denies hyper or hypo thyroid symptoms such as voice, skin or hair change.  Trying to follow lower chol diet.   Pt denies polydipsia, polyuria, Pt denies chest pain, increased sob or doe, wheezing, orthopnea, PND, increased LE swelling, palpitations, dizziness or syncope.  Denies new focal neuro s/s.   Pt denies fever, wt loss, night sweats, loss of appetite, or other constitutional symptoms  No other new complaints  Wt Readings from Last 3 Encounters:  02/15/21 281 lb (127.5 kg)  09/22/20 280 lb (127 kg)  05/01/20 278 lb (126.1 kg)   BP Readings from Last 3 Encounters:  02/15/21 122/70  09/22/20 120/70  05/01/20 124/70   Immunization History  Administered Date(s) Administered  . Influenza Whole 09/05/2007, 08/07/2009, 07/19/2010  . Influenza,inj,Quad PF,6+ Mos 11/18/2013, 08/03/2018, 08/24/2019  . Influenza-Unspecified 08/17/2016  . PFIZER(Purple Top)SARS-COV-2 Vaccination 01/23/2020, 02/13/2020  . Pneumococcal Conjugate-13 02/15/2021  . Td 09/17/2007  . Tdap 03/27/2018   There are no preventive care reminders to display for this patient.    Past Medical History:  Diagnosis Date  . Atypical chest pain   . Blood transfusion without reported diagnosis   . GERD (gastroesophageal reflux disease)   . Hypertension   . Morbid obesity ([place])   . Osteoarthritis, knee   . Thyroid disease   . TIA (transient ischemic attack) 09/22/2020   Past Surgical History:  Procedure Laterality Date  . CHOLECYSTECTOMY    . COSMETIC SURGERY   07/2010   (lower facelift, lower blepharoplasty)  . GASTRIC BYPASS    . LAPAROSCOPIC GASTRIC BANDING    . ROTATOR CUFF REPAIR Bilateral   . TOTAL ABDOMINAL HYSTERECTOMY    . TOTAL KNEE ARTHROPLASTY Left 10/28/2016   Procedure: LEFT TOTAL KNEE ARTHROPLASTY;  Surgeon: Sydnee Cabal, MD;  Location: [facility];  Service: Orthopedics;  Laterality: Left;  . TOTAL KNEE ARTHROPLASTY Right 03/10/2017   Procedure: RIGHT TOTAL KNEE ARTHROPLASTY;  Surgeon: Sydnee Cabal, MD;  Location: [facility];  Service: Orthopedics;  Laterality: Right;    reports that she has never smoked. She has never used smokeless tobacco. She reports that she does not drink alcohol and does not use drugs. family history includes Cancer in her brother; Colon cancer in her maternal uncle; Colon cancer (age of onset: 57) in her brother; Hypertension in her mother; Prostate cancer in her brother; Stroke in her mother. Allergies  Allergen Reactions  . Iodinated Diagnostic Agents Shortness Of Breath  . Vicodin [Hydrocodone-Acetaminophen] Shortness Of Breath    REACTION: Shortness of Breath   . Hydrocodone-Acetaminophen     REACTION: Shortness of Breath  . Other   . Valsartan     Eye redness. No current issues with losartan Eye redness. No current issues with losartan   Current Outpatient Medications on File Prior to Visit  Medication Sig Dispense Refill  . atorvastatin (LIPITOR) 20  MG tablet Take 1 tablet (20 mg total) by mouth daily. 90 tablet 3  . famotidine (PEPCID) 20 MG tablet Take 1 tablet (20 mg total) by mouth 2 (two) times daily. 180 tablet 3  . levothyroxine (SYNTHROID) 50 MCG tablet TAKE 1 TABLET BY MOUTH DAILY BEFORE BREAKFAST 90 tablet 1  . losartan-hydrochlorothiazide (HYZAAR) 100-12.5 MG tablet TAKE 1 TABLET BY MOUTH DAILY 90 tablet 0  . meloxicam (MOBIC) 15 MG tablet Take 1 tablet (15 mg total) by mouth daily as needed for pain. 90 tablet 3  . pantoprazole (PROTONIX) 40 MG tablet TAKE 1 TABLET(40 MG) BY MOUTH TWICE  DAILY BEFORE A MEAL 180 tablet 3  . traMADol (ULTRAM) 50 MG tablet TAKE 1 TABLET(50 MG) BY MOUTH EVERY 6 HOURS AS NEEDED 30 tablet 2  . triamcinolone (KENALOG) 0.025 % ointment APPLY EXTERNALLY TO THE AFFECTED AREA TWICE DAILY 80 g 1  . amoxicillin (AMOXIL) 500 MG capsule Take 1,000 mg by mouth 2 (two) times daily.    Marland Kitchen aspirin EC 81 MG tablet Take 1 tablet (81 mg total) by mouth daily. Swallow whole. 30 tablet 11  . fluticasone (CUTIVATE) 0.05 % cream Apply topically.    . [DISCONTINUED] potassium chloride (K-DUR) 10 MEQ tablet Take 1 tablet (10 mEq total) by mouth daily. 90 tablet 0   No current facility-administered medications on file prior to visit.        ROS:  All others reviewed and negative.  Objective        PE:  BP 122/70 (BP Location: Left Arm, Patient Position: Sitting, Cuff Size: Large)   Pulse 67   Ht 5\' 6"  (1.676 m)   Wt 281 lb (127.5 kg)   SpO2 99%   BMI 45.35 kg/m                 Constitutional: Pt appears in NAD               HENT: Head: NCAT.                Right Ear: External ear normal.                 Left Ear: External ear normal.                Eyes: . Pupils are equal, round, and reactive to light. Conjunctivae and EOM are normal               Nose: without d/c or deformity               Neck: Neck supple. Gross normal ROM               Cardiovascular: Normal rate and regular rhythm.                 Pulmonary/Chest: Effort normal and breath sounds without rales or wheezing.                Abd:  Soft, NT, ND, + BS, no organomegaly               Neurological: Pt is alert. At baseline orientation, motor grossly intact               Skin: Skin is warm. No rashes, no other new lesions, LE edema - none               Psychiatric: Pt behavior is normal without agitation   Micro: none  Cardiac tracings I have  personally interpreted today:  none  Pertinent Radiological findings (summarize): none   Lab Results  Component Value Date   WBC 7.2 02/15/2021    HGB 12.3 02/15/2021   HCT 38.7 02/15/2021   PLT 290.0 02/15/2021   GLUCOSE 97 02/15/2021   CHOL 167 02/15/2021   TRIG 53.0 02/15/2021   HDL 79.50 02/15/2021   LDLDIRECT 99.0 12/15/2008   LDLCALC 77 02/15/2021   ALT 16 02/15/2021   AST 18 02/15/2021   NA 140 02/15/2021   K 4.2 02/15/2021   CL 105 02/15/2021   CREATININE 0.66 02/15/2021   BUN 19 02/15/2021   CO2 30 02/15/2021   TSH 0.30 (L) 02/15/2021   INR 0.95 03/03/2017   HGBA1C 6.3 02/15/2021   Assessment/Plan:  RICKA WESTRA is a 65 y.o. Black or African American [2] female with  has a past medical history of Atypical chest pain, Blood transfusion without reported diagnosis, GERD (gastroesophageal reflux disease), Hypertension, Morbid obesity ([place]), Osteoarthritis, knee, Thyroid disease, and TIA (transient ischemic attack) (09/22/2020).  Encounter for well adult exam with abnormal findings Age and sex appropriate education and counseling updated with regular exercise and diet Referrals for preventative services - none needed Immunizations addressed - for prevnar 13, declines covid booster Smoking counseling  - none needed Evidence for depression or other mood disorder - none significant Most recent labs reviewed. I have personally reviewed and have noted: 1) the patient's medical and social history 2) The patient's current medications and supplements 3) The patient's height, weight, and BMI have been recorded in the chart   Vitamin D deficiency Last vitamin D Lab Results  Component Value Date   VD25OH 22.73 (L) 02/15/2021   Low to start oral replacement   HLD (hyperlipidemia) Lab Results  Component Value Date   LDLCALC 77 02/15/2021   Stable, pt to continue current statin  - lipitor 20   Abnormal glucose Lab Results  Component Value Date   HGBA1C 6.3 02/15/2021   Stable, pt to continue current medical treatment  - diet   Hypothyroidism Lab Results  Component Value Date   TSH 0.30 (L) 02/15/2021    Stable, pt to continue levothyroxine   HTN (hypertension) BP Readings from Last 3 Encounters:  02/15/21 122/70  09/22/20 120/70  05/01/20 124/70   Stable, pt to continue medical treatment hyzaar   Obesity Ok for restart phentermine x 3 mo limited rx, lower calorie diet  Followup: Return in about 6 months (around 08/17/2021).  Cathlean Cower, MD 02/15/2021 9:28 PM [facility]

## 2021-02-15 NOTE — Assessment & Plan Note (Signed)
Lab Results  Component Value Date   LDLCALC 77 02/15/2021   Stable, pt to continue current statin  - lipitor 20

## 2021-02-15 NOTE — Assessment & Plan Note (Signed)
Age and sex appropriate education and counseling updated with regular exercise and diet Referrals for preventative services - none needed Immunizations addressed - for prevnar 13, declines covid booster Smoking counseling  - none needed Evidence for depression or other mood disorder - none significant Most recent labs reviewed. I have personally reviewed and have noted: 1) the patient's medical and social history 2) The patient's current medications and supplements 3) The patient's height, weight, and BMI have been recorded in the chart

## 2021-02-15 NOTE — Assessment & Plan Note (Signed)
Last vitamin D Lab Results  Component Value Date   VD25OH 22.73 (L) 02/15/2021   Low to start oral replacement

## 2021-02-15 NOTE — Assessment & Plan Note (Signed)
Ok for restart phentermine x 3 mo limited rx, lower calorie diet

## 2021-02-18 ENCOUNTER — Encounter: Payer: Self-pay | Admitting: Internal Medicine

## 2021-02-18 DIAGNOSIS — M13842 Other specified arthritis, left hand: Secondary | ICD-10-CM | POA: Diagnosis not present

## 2021-02-18 DIAGNOSIS — M13841 Other specified arthritis, right hand: Secondary | ICD-10-CM | POA: Diagnosis not present

## 2021-02-18 LAB — URINALYSIS, ROUTINE W REFLEX MICROSCOPIC
Bilirubin Urine: NEGATIVE
Hgb urine dipstick: NEGATIVE
Ketones, ur: NEGATIVE
Leukocytes,Ua: NEGATIVE
Nitrite: NEGATIVE
RBC / HPF: NONE SEEN (ref 0–?)
Specific Gravity, Urine: 1.015 (ref 1.000–1.030)
Total Protein, Urine: NEGATIVE
Urine Glucose: NEGATIVE
Urobilinogen, UA: 1 (ref 0.0–1.0)
pH: 6.5 (ref 5.0–8.0)

## 2021-02-20 ENCOUNTER — Other Ambulatory Visit: Payer: Self-pay | Admitting: Internal Medicine

## 2021-02-20 DIAGNOSIS — E039 Hypothyroidism, unspecified: Secondary | ICD-10-CM

## 2021-02-20 NOTE — Telephone Encounter (Signed)
Please refill as per office routine med refill policy (all routine meds refilled for 3 mo or monthly per pt preference up to one year from last visit, then month to month grace period for 3 mo, then further med refills will have to be denied)  

## 2021-04-15 ENCOUNTER — Other Ambulatory Visit: Payer: Self-pay | Admitting: Internal Medicine

## 2021-04-21 ENCOUNTER — Other Ambulatory Visit: Payer: Self-pay | Admitting: Internal Medicine

## 2021-04-21 NOTE — Telephone Encounter (Signed)
Please refill as per office routine med refill policy (all routine meds refilled for 3 mo or monthly per pt preference up to one year from last visit, then month to month grace period for 3 mo, then further med refills will have to be denied)  

## 2021-08-16 ENCOUNTER — Ambulatory Visit: Payer: Medicare Other | Admitting: Internal Medicine

## 2021-09-01 ENCOUNTER — Other Ambulatory Visit: Payer: Self-pay | Admitting: Internal Medicine

## 2021-09-01 ENCOUNTER — Other Ambulatory Visit: Payer: Self-pay

## 2021-09-01 DIAGNOSIS — E039 Hypothyroidism, unspecified: Secondary | ICD-10-CM

## 2021-09-02 NOTE — Telephone Encounter (Signed)
Please refill as per office routine med refill policy (all routine meds to be refilled for 3 mo or monthly (per pt preference) up to one year from last visit, then month to month grace period for 3 mo, then further med refills will have to be denied) ? ?

## 2021-10-03 ENCOUNTER — Other Ambulatory Visit: Payer: Self-pay | Admitting: Internal Medicine

## 2021-10-03 NOTE — Telephone Encounter (Signed)
Please refill as per office routine med refill policy (all routine meds to be refilled for 3 mo or monthly (per pt preference) up to one year from last visit, then month to month grace period for 3 mo, then further med refills will have to be denied) ? ?

## 2021-11-18 ENCOUNTER — Other Ambulatory Visit: Payer: Self-pay | Admitting: Internal Medicine

## 2021-11-18 DIAGNOSIS — E039 Hypothyroidism, unspecified: Secondary | ICD-10-CM

## 2021-11-18 NOTE — Telephone Encounter (Signed)
Please refill as per office routine med refill policy (all routine meds to be refilled for 3 mo or monthly (per pt preference) up to one year from last visit, then month to month grace period for 3 mo, then further med refills will have to be denied) ? ?

## 2021-11-19 ENCOUNTER — Other Ambulatory Visit: Payer: Self-pay | Admitting: Internal Medicine

## 2021-11-19 DIAGNOSIS — E039 Hypothyroidism, unspecified: Secondary | ICD-10-CM

## 2021-11-19 NOTE — Telephone Encounter (Signed)
Please refill as per office routine med refill policy (all routine meds to be refilled for 3 mo or monthly (per pt preference) up to one year from last visit, then month to month grace period for 3 mo, then further med refills will have to be denied) ? ?

## 2021-12-18 ENCOUNTER — Other Ambulatory Visit: Payer: Self-pay | Admitting: Internal Medicine

## 2021-12-18 DIAGNOSIS — E039 Hypothyroidism, unspecified: Secondary | ICD-10-CM

## 2021-12-18 NOTE — Telephone Encounter (Signed)
Please refill as per office routine med refill policy (all routine meds to be refilled for 3 mo or monthly (per pt preference) up to one year from last visit, then month to month grace period for 3 mo, then further med refills will have to be denied) ? ?

## 2023-08-28 DIAGNOSIS — M79641 Pain in right hand: Secondary | ICD-10-CM | POA: Insufficient documentation

## 2024-04-05 DIAGNOSIS — H524 Presbyopia: Secondary | ICD-10-CM | POA: Diagnosis not present

## 2024-04-05 DIAGNOSIS — H10413 Chronic giant papillary conjunctivitis, bilateral: Secondary | ICD-10-CM | POA: Diagnosis not present

## 2024-04-05 DIAGNOSIS — H2513 Age-related nuclear cataract, bilateral: Secondary | ICD-10-CM | POA: Diagnosis not present

## 2024-04-18 ENCOUNTER — Ambulatory Visit: Admitting: Internal Medicine

## 2024-04-18 ENCOUNTER — Encounter: Payer: Self-pay | Admitting: Internal Medicine

## 2024-04-18 ENCOUNTER — Telehealth: Payer: Self-pay

## 2024-04-18 ENCOUNTER — Other Ambulatory Visit (HOSPITAL_COMMUNITY): Payer: Self-pay

## 2024-04-18 VITALS — BP 118/72 | HR 64 | Temp 98.6°F | Ht 66.0 in | Wt 218.0 lb

## 2024-04-18 DIAGNOSIS — E66813 Obesity, class 3: Secondary | ICD-10-CM | POA: Diagnosis not present

## 2024-04-18 DIAGNOSIS — K219 Gastro-esophageal reflux disease without esophagitis: Secondary | ICD-10-CM

## 2024-04-18 DIAGNOSIS — E7849 Other hyperlipidemia: Secondary | ICD-10-CM | POA: Diagnosis not present

## 2024-04-18 DIAGNOSIS — Z9884 Bariatric surgery status: Secondary | ICD-10-CM

## 2024-04-18 DIAGNOSIS — R1011 Right upper quadrant pain: Secondary | ICD-10-CM | POA: Diagnosis not present

## 2024-04-18 DIAGNOSIS — Z1231 Encounter for screening mammogram for malignant neoplasm of breast: Secondary | ICD-10-CM

## 2024-04-18 DIAGNOSIS — E559 Vitamin D deficiency, unspecified: Secondary | ICD-10-CM

## 2024-04-18 DIAGNOSIS — Z0001 Encounter for general adult medical examination with abnormal findings: Secondary | ICD-10-CM

## 2024-04-18 DIAGNOSIS — R7309 Other abnormal glucose: Secondary | ICD-10-CM | POA: Diagnosis not present

## 2024-04-18 DIAGNOSIS — E538 Deficiency of other specified B group vitamins: Secondary | ICD-10-CM | POA: Diagnosis not present

## 2024-04-18 DIAGNOSIS — Z0000 Encounter for general adult medical examination without abnormal findings: Secondary | ICD-10-CM | POA: Diagnosis not present

## 2024-04-18 DIAGNOSIS — I10 Essential (primary) hypertension: Secondary | ICD-10-CM

## 2024-04-18 DIAGNOSIS — Z6841 Body Mass Index (BMI) 40.0 and over, adult: Secondary | ICD-10-CM

## 2024-04-18 LAB — URINALYSIS, ROUTINE W REFLEX MICROSCOPIC
Bilirubin Urine: NEGATIVE
Hgb urine dipstick: NEGATIVE
Ketones, ur: NEGATIVE
Leukocytes,Ua: NEGATIVE
Nitrite: NEGATIVE
Specific Gravity, Urine: 1.02 (ref 1.000–1.030)
Total Protein, Urine: NEGATIVE
Urine Glucose: NEGATIVE
Urobilinogen, UA: 0.2 (ref 0.0–1.0)
pH: 6 (ref 5.0–8.0)

## 2024-04-18 LAB — BASIC METABOLIC PANEL WITH GFR
BUN: 13 mg/dL (ref 6–23)
CO2: 31 meq/L (ref 19–32)
Calcium: 9.1 mg/dL (ref 8.4–10.5)
Chloride: 103 meq/L (ref 96–112)
Creatinine, Ser: 0.62 mg/dL (ref 0.40–1.20)
GFR: 91.5 mL/min (ref >60.00)
Glucose, Bld: 87 mg/dL (ref 70–99)
Potassium: 3.8 meq/L (ref 3.5–5.1)
Sodium: 139 meq/L (ref 135–145)

## 2024-04-18 LAB — CBC WITH DIFFERENTIAL/PLATELET
Basophils Absolute: 0.1 10*3/uL (ref 0.0–0.1)
Basophils Relative: 0.9 % (ref 0.0–3.0)
Eosinophils Absolute: 0.1 10*3/uL (ref 0.0–0.7)
Eosinophils Relative: 1.9 % (ref 0.0–5.0)
HCT: 38.6 % (ref 36.0–46.0)
Hemoglobin: 12.5 g/dL (ref 12.0–15.0)
Lymphocytes Relative: 21.6 % (ref 12.0–46.0)
Lymphs Abs: 1.3 10*3/uL (ref 0.7–4.0)
MCHC: 32.3 g/dL (ref 30.0–36.0)
MCV: 81.9 fl (ref 78.0–100.0)
Monocytes Absolute: 0.6 10*3/uL (ref 0.1–1.0)
Monocytes Relative: 10 % (ref 3.0–12.0)
Neutro Abs: 4 10*3/uL (ref 1.4–7.7)
Neutrophils Relative %: 65.6 % (ref 43.0–77.0)
Platelets: 284 10*3/uL (ref 150.0–400.0)
RBC: 4.71 Mil/uL (ref 3.87–5.11)
RDW: 15.8 % — ABNORMAL HIGH (ref 11.5–15.5)
WBC: 6.2 10*3/uL (ref 4.0–10.5)

## 2024-04-18 LAB — HEPATIC FUNCTION PANEL
ALT: 21 U/L (ref 0–35)
AST: 23 U/L (ref 0–37)
Albumin: 3.9 g/dL (ref 3.5–5.2)
Alkaline Phosphatase: 141 U/L — ABNORMAL HIGH (ref 39–117)
Bilirubin, Direct: 0.1 mg/dL (ref 0.0–0.3)
Total Bilirubin: 0.4 mg/dL (ref 0.2–1.2)
Total Protein: 7.3 g/dL (ref 6.0–8.3)

## 2024-04-18 LAB — TSH: TSH: 0.51 u[IU]/mL (ref 0.35–5.50)

## 2024-04-18 LAB — LIPID PANEL
Cholesterol: 215 mg/dL — ABNORMAL HIGH (ref 0–200)
HDL: 89 mg/dL (ref >39.00)
LDL Cholesterol: 114 mg/dL — ABNORMAL HIGH (ref 0–99)
NonHDL: 125.74
Total CHOL/HDL Ratio: 2
Triglycerides: 61 mg/dL (ref 0.0–149.0)
VLDL: 12.2 mg/dL (ref 0.0–40.0)

## 2024-04-18 LAB — HEMOGLOBIN A1C: Hgb A1c MFr Bld: 5.9 % (ref 4.6–6.5)

## 2024-04-18 LAB — VITAMIN D 25 HYDROXY (VIT D DEFICIENCY, FRACTURES): VITD: 28.25 ng/mL — ABNORMAL LOW (ref 30.00–100.00)

## 2024-04-18 LAB — VITAMIN B12: Vitamin B-12: 742 pg/mL (ref 211–911)

## 2024-04-18 MED ORDER — PANTOPRAZOLE SODIUM 40 MG PO TBEC: 40.0000 mg | DELAYED_RELEASE_TABLET | Freq: Two times a day (BID) | ORAL | 3 refills | Status: AC

## 2024-04-18 MED ORDER — LOSARTAN POTASSIUM-HCTZ 100-12.5 MG PO TABS: 1.0000 | ORAL_TABLET | Freq: Every day | ORAL | 3 refills | Status: DC

## 2024-04-18 NOTE — Assessment & Plan Note (Signed)
 Ok for hard copy of script for ozempic 12.5/2.5 ml at 48 units weekly, but not clear if this is avialable at local pharmacy

## 2024-04-18 NOTE — Assessment & Plan Note (Signed)
 BP Readings from Last 3 Encounters:  04/18/24 118/72  02/15/21 122/70  09/22/20 120/70   Stable, pt to continue medical treatment hyzaar 100 12.5 mg qd

## 2024-04-18 NOTE — Assessment & Plan Note (Signed)
 Lab Results  Component Value Date   HGBA1C 6.3 02/15/2021   Stable, pt to continue current medical treatment  - diet, wt control

## 2024-04-18 NOTE — Assessment & Plan Note (Signed)
 Also for f/u lab including cbc and LFTs today,, exam benign today

## 2024-04-18 NOTE — Assessment & Plan Note (Signed)
 Also for referral general surgury batriatric to assess

## 2024-04-18 NOTE — Assessment & Plan Note (Signed)
 Stable, cont PPI bid,  to f/u any worsening symptoms or concerns

## 2024-04-18 NOTE — Telephone Encounter (Signed)
 Pharmacy Patient Advocate Encounter  Received notification from [facility] that Prior Authorization for Pantoprazole  Sodium 40MG  dr tablets has been APPROVED Unable to obtain price due to refill too soon rejection next available fill date 06/25/2024

## 2024-04-18 NOTE — Assessment & Plan Note (Signed)
 Last vitamin D  Lab Results  Component Value Date   VD25OH 22.73 (L) 02/15/2021   Low, to start oral replacement, for f/u lab

## 2024-04-18 NOTE — Assessment & Plan Note (Signed)
 Age and sex appropriate education and counseling updated with regular exercise and diet Referrals for preventative services - for mammogram Immunizations addressed - for shingrix at pharmacy Smoking counseling  - none needed Evidence for depression or other mood disorder - none significant Most recent labs reviewed. I have personally reviewed and have noted: 1) the patient's medical and social history 2) The patient's current medications and supplements 3) The patient's height, weight, and BMI have been recorded in the chart

## 2024-04-18 NOTE — Progress Notes (Signed)
 Patient ID: Gina Ryan, female   DOB: 26-Mar-1956, 68 y.o.   MRN: [id number]         Chief Complaint:: wellness exam and obesity, RUQ pain s/p gastric bypass, htn, GERD       HPI:  Gina Ryan is a 68 y.o. female here for wellness exam; for shingrix at pharmacy, due for mammogram, o/w up to date                        Also has lost significant wt with semaglutide with unusual vial admin she has been getting in [place]  for the past several years, now moved back to the area, tak.cpj  ing 48 units per week of semaglutide 12.5mg /2.5 cc, quantity 2.5 cc  Not taking lipitor and working on diet.  Pt denies chest pain, increased sob or doe, wheezing, orthopnea, PND, increased LE swelling, palpitations, dizziness or syncope.   Pt denies polydipsia, polyuria, or new focal neuro s/s.    Pt denies fever,night sweats, loss of appetite, or other constitutional symptoms  though wt down to 218 today from 281 in 2022.  Does also have recurring RUQ pain related to the port s/p gastric bypass, and forced to go to ED more than once.  Asking for bariatric surgury for possible removal.     Wt Readings from Last 3 Encounters:  04/18/24 218 lb (98.9 kg)  02/15/21 281 lb (127.5 kg)  09/22/20 280 lb (127 kg)   BP Readings from Last 3 Encounters:  04/18/24 118/72  02/15/21 122/70  09/22/20 120/70   Immunization History  Administered Date(s) Administered   Influenza Whole 09/05/2007, 08/07/2009, 07/19/2010   Influenza,inj,Quad PF,6+ Mos 11/18/2013, 08/03/2018, 08/24/2019   Influenza-Unspecified 08/17/2016   PFIZER(Purple Top)SARS-COV-2 Vaccination 01/23/2020, 02/13/2020   PNEUMOCOCCAL CONJUGATE-20 09/27/2021   Pneumococcal Conjugate-13 02/15/2021   Td 09/17/2007   Tdap 03/27/2018   Health Maintenance Due  Topic Date Due   Zoster Vaccines- Shingrix (1 of 2) Never done   MAMMOGRAM  07/09/2022      Past Medical History:  Diagnosis Date   Atypical chest pain    Blood transfusion without reported diagnosis     GERD (gastroesophageal reflux disease)    Hypertension    Morbid obesity (HCC)    Osteoarthritis, knee    Thyroid  disease    TIA (transient ischemic attack) 09/22/2020   Past Surgical History:  Procedure Laterality Date   CHOLECYSTECTOMY     COSMETIC SURGERY  07/2010   (lower facelift, lower blepharoplasty)   GASTRIC BYPASS     LAPAROSCOPIC GASTRIC BANDING     ROTATOR CUFF REPAIR Bilateral    TOTAL ABDOMINAL HYSTERECTOMY     TOTAL KNEE ARTHROPLASTY Left 10/28/2016   Procedure: LEFT TOTAL KNEE ARTHROPLASTY;  Surgeon: Genevie Kerns, MD;  Location: [facility];  Service: Orthopedics;  Laterality: Left;   TOTAL KNEE ARTHROPLASTY Right 03/10/2017   Procedure: RIGHT TOTAL KNEE ARTHROPLASTY;  Surgeon: Genevie Kerns, MD;  Location: [facility];  Service: Orthopedics;  Laterality: Right;    reports that she has never smoked. She has never used smokeless tobacco. She reports that she does not drink alcohol and does not use drugs. family history includes Cancer in her brother; Colon cancer in her maternal uncle; Colon cancer (age of onset: 28) in her brother; Hypertension in her mother; Prostate cancer in her brother; Stroke in her mother. Allergies  Allergen Reactions   Iodinated Contrast Media Shortness Of Breath   Vicodin [Hydrocodone-Acetaminophen ] Shortness Of  Breath    REACTION: Shortness of Breath    Hydrocodone-Acetaminophen      REACTION: Shortness of Breath   Other    Oxycodone -Acetaminophen      SOB, sweating, and panicking   Valsartan     Eye redness. No current issues with losartan  Eye redness. No current issues with losartan    Current Outpatient Medications on File Prior to Visit  Medication Sig Dispense Refill   famotidine  (PEPCID ) 20 MG tablet Take 1 tablet (20 mg total) by mouth 2 (two) times daily. 180 tablet 3   meloxicam  (MOBIC ) 15 MG tablet Take 1 tablet (15 mg total) by mouth daily as needed for pain. 90 tablet 3   traMADol  (ULTRAM ) 50 MG tablet TAKE 1 TABLET(50 MG) BY  MOUTH EVERY 6 HOURS AS NEEDED 30 tablet 2   [DISCONTINUED] potassium chloride  (K-DUR) 10 MEQ tablet Take 1 tablet (10 mEq total) by mouth daily. 90 tablet 0   No current facility-administered medications on file prior to visit.        ROS:  All others reviewed and negative.  Objective        PE:  BP 118/72 (BP Location: Right Arm, Patient Position: Sitting, Cuff Size: Normal)   Pulse 64   Temp 98.6 F (37 C) (Oral)   Ht 5' 6 (1.676 m)   Wt 218 lb (98.9 kg)   SpO2 99%   BMI 35.19 kg/m                 Constitutional: Pt appears in NAD               HENT: Head: NCAT.                Right Ear: External ear normal.                 Left Ear: External ear normal.                Eyes: . Pupils are equal, round, and reactive to light. Conjunctivae and EOM are normal               Nose: without d/c or deformity               Neck: Neck supple. Gross normal ROM               Cardiovascular: Normal rate and regular rhythm.                 Pulmonary/Chest: Effort normal and breath sounds without rales or wheezing.                Abd:  Soft, NT, ND, + BS, no organomegaly               Neurological: Pt is alert. At baseline orientation, motor grossly intact               Skin: Skin is warm. No rashes, no other new lesions, LE edema - none               Psychiatric: Pt behavior is normal without agitation   Micro: none  Cardiac tracings I have personally interpreted today:  none  Pertinent Radiological findings (summarize): none   Lab Results  Component Value Date   WBC 7.2 02/15/2021   HGB 12.3 02/15/2021   HCT 38.7 02/15/2021   PLT 290.0 02/15/2021   GLUCOSE 97 02/15/2021   CHOL 167 02/15/2021   TRIG 53.0 02/15/2021   HDL 79.50  02/15/2021   LDLDIRECT 99.0 12/15/2008   LDLCALC 77 02/15/2021   ALT 16 02/15/2021   AST 18 02/15/2021   NA 140 02/15/2021   K 4.2 02/15/2021   CL 105 02/15/2021   CREATININE 0.66 02/15/2021   BUN 19 02/15/2021   CO2 30 02/15/2021   TSH 0.30 (L)  02/15/2021   INR 0.95 03/03/2017   HGBA1C 6.3 02/15/2021   Assessment/Plan:  Gina Ryan is a 68 y.o. Black or African American [2] female with  has a past medical history of Atypical chest pain, Blood transfusion without reported diagnosis, GERD (gastroesophageal reflux disease), Hypertension, Morbid obesity (HCC), Osteoarthritis, knee, Thyroid  disease, and TIA (transient ischemic attack) (09/22/2020).  Encounter for well adult exam with abnormal findings Age and sex appropriate education and counseling updated with regular exercise and diet Referrals for preventative services - for mammogram Immunizations addressed - for shingrix at pharmacy Smoking counseling  - none needed Evidence for depression or other mood disorder - none significant Most recent labs reviewed. I have personally reviewed and have noted: 1) the patient's medical and social history 2) The patient's current medications and supplements 3) The patient's height, weight, and BMI have been recorded in the chart   Vitamin D  deficiency Last vitamin D  Lab Results  Component Value Date   VD25OH 22.73 (L) 02/15/2021   Low, to start oral replacement, for f/u lab   Obesity Ok for hard copy of script for ozempic 12.5/2.5 ml at 48 units weekly, but not clear if this is avialable at local pharmacy  HTN (hypertension) BP Readings from Last 3 Encounters:  04/18/24 118/72  02/15/21 122/70  09/22/20 120/70   Stable, pt to continue medical treatment hyzaar 100 12.5 mg qd   HLD (hyperlipidemia) Lab Results  Component Value Date   LDLCALC 77 02/15/2021   Stable at last visit with taking lipitor, pt to cont low chol diet and f/u lipids   H/O gastric bypass Also for referral general surgury batriatric to assess  Gastroesophageal reflux disease Stable, cont PPI bid,  to f/u any worsening symptoms or concerns   Abnormal glucose Lab Results  Component Value Date   HGBA1C 6.3 02/15/2021   Stable, pt to continue  current medical treatment  - diet, wt control   RUQ pain Also for f/u lab including cbc and LFTs today,, exam benign today Followup: Return in about 6 months (around 10/18/2024).  Rosalia Colonel, MD 04/18/2024 12:43 PM [facility]

## 2024-04-18 NOTE — Patient Instructions (Addendum)
 Please have your Shingrix (shingles) shots done at your local pharmacy.  Ok to take the hardcopy prescription to the pharmacy to continue the semaglutide 12.5/2.5 cc - 48 units once weekly (quantity 2.5 ml)\  Please continue all other medications as before, and refills have been done if requested.  Please have the pharmacy call with any other refills you may need.  Please continue your efforts at being more active, low cholesterol diet, and weight control.  You are otherwise up to date with prevention measures today.  Please keep your appointments with your specialists as you may have planned  You will be contacted regarding the referral for: mammogram, and general surgury  Please go to the LAB at the blood drawing area for the tests to be done  You will be contacted by phone if any changes need to be made immediately.  Otherwise, you will receive a letter about your results with an explanation, but please check with MyChart first.  Please make an Appointment to return in 6 months, or sooner if needed

## 2024-04-18 NOTE — Assessment & Plan Note (Signed)
 Lab Results  Component Value Date   LDLCALC 77 02/15/2021   Stable at last visit with taking lipitor, pt to cont low chol diet and f/u lipids

## 2024-04-19 ENCOUNTER — Other Ambulatory Visit: Payer: Self-pay | Admitting: Internal Medicine

## 2024-04-19 ENCOUNTER — Ambulatory Visit: Payer: Self-pay | Admitting: Internal Medicine

## 2024-04-19 ENCOUNTER — Encounter: Payer: Self-pay | Admitting: Internal Medicine

## 2024-04-19 MED ORDER — LOSARTAN POTASSIUM-HCTZ 50-12.5 MG PO TABS: 1.0000 | ORAL_TABLET | Freq: Every day | ORAL | 3 refills | Status: AC

## 2024-04-19 MED ORDER — ROSUVASTATIN CALCIUM 20 MG PO TABS: 20.0000 mg | ORAL_TABLET | Freq: Every day | ORAL | 3 refills | Status: AC

## 2024-04-19 MED ORDER — ASPIRIN 81 MG PO TBEC: 81.0000 mg | DELAYED_RELEASE_TABLET | Freq: Every day | ORAL | Status: AC

## 2024-04-22 ENCOUNTER — Telehealth: Payer: Self-pay | Admitting: Internal Medicine

## 2024-04-22 NOTE — Telephone Encounter (Signed)
 Copied from CRM [id number]. Topic: Clinical - Prescription Issue >> Apr 22, 2024 11:34 AM Gina Ryan wrote: Reason for CRM: [facility] pharmacy called to advise to have semaglutide 12.5 MG - 2.5 ML  resent to pharmacy.

## 2024-04-22 NOTE — Telephone Encounter (Signed)
 Sorry I am not able as the EPIC will not recognize this version of semaglutide

## 2024-04-29 ENCOUNTER — Encounter: Payer: Self-pay | Admitting: Internal Medicine

## 2024-04-29 MED ORDER — OZEMPIC (0.25 OR 0.5 MG/DOSE) 2 MG/3ML ~~LOC~~ SOPN: PEN_INJECTOR | SUBCUTANEOUS | 3 refills | Status: AC

## 2024-04-30 ENCOUNTER — Telehealth: Payer: Self-pay

## 2024-04-30 ENCOUNTER — Other Ambulatory Visit (HOSPITAL_COMMUNITY): Payer: Self-pay

## 2024-04-30 NOTE — Telephone Encounter (Signed)
 Patient highest A1C is 6.3 from 02/15/21.  Tried to run a test claim for [facility], patient has plan benefit exclusion, it will not be covered.  Ozempic/Mounjaro is approved exclusively as an adjunct to diet and exercise to improve glycemic  control in adults with type 2 diabetes mellitus. A review of patient's medical chart reveals no  documented diagnosis of type 2 diabetes or an A1C indicative of diabetes. Therefore, they do not  currently meet the criteria for prior authorization of this medication. If clinically appropriate, alternative  options such as Saxenda, Zepbound, or Georjean may be considered for this patient.

## 2024-05-06 ENCOUNTER — Encounter: Payer: Self-pay | Admitting: Internal Medicine

## 2024-05-06 MED ORDER — LEVOTHYROXINE SODIUM 25 MCG PO TABS: 25.0000 ug | ORAL_TABLET | Freq: Every day | ORAL | 3 refills | Status: AC

## 2024-05-13 ENCOUNTER — Encounter: Payer: Self-pay | Admitting: Internal Medicine

## 2024-06-17 ENCOUNTER — Ambulatory Visit

## 2024-07-02 ENCOUNTER — Ambulatory Visit: Admitting: Family Medicine

## 2024-07-25 ENCOUNTER — Encounter: Payer: Self-pay | Admitting: Internal Medicine

## 2024-07-29 ENCOUNTER — Other Ambulatory Visit: Payer: Self-pay

## 2024-07-29 MED ORDER — FAMOTIDINE 20 MG PO TABS: 20.0000 mg | ORAL_TABLET | Freq: Two times a day (BID) | ORAL | 3 refills | Status: AC

## 2024-08-12 ENCOUNTER — Telehealth: Admitting: Family Medicine

## 2024-08-12 DIAGNOSIS — J019 Acute sinusitis, unspecified: Secondary | ICD-10-CM | POA: Diagnosis not present

## 2024-08-12 DIAGNOSIS — B9689 Other specified bacterial agents as the cause of diseases classified elsewhere: Secondary | ICD-10-CM

## 2024-08-12 DIAGNOSIS — J40 Bronchitis, not specified as acute or chronic: Secondary | ICD-10-CM

## 2024-08-12 MED ORDER — PROMETHAZINE-DM 6.25-15 MG/5ML PO SYRP: 5.0000 mL | ORAL_SOLUTION | Freq: Four times a day (QID) | ORAL | 0 refills | Status: AC | PRN

## 2024-08-12 MED ORDER — DOXYCYCLINE HYCLATE 100 MG PO TABS: 100.0000 mg | ORAL_TABLET | Freq: Two times a day (BID) | ORAL | 0 refills | Status: AC

## 2024-08-12 NOTE — Progress Notes (Signed)
 Virtual Visit Consent   Gina Ryan, you are scheduled for a virtual visit with a [facility] [place] provider today. Just as with appointments in the office, your consent must be obtained to participate. Your consent will be active for this visit and any virtual visit you may have with one of our providers in the next 365 days. If you have a MyChart account, a copy of this consent can be sent to you electronically.  As this is a virtual visit, video technology does not allow for your provider to perform a traditional examination. This may limit your provider's ability to fully assess your condition. If your provider identifies any concerns that need to be evaluated in person or the need to arrange testing (such as labs, EKG, etc.), we will make arrangements to do so. Although advances in technology are sophisticated, we cannot ensure that it will always work on either your end or our end. If the connection with a video visit is poor, the visit may have to be switched to a telephone visit. With either a video or telephone visit, we are not always able to ensure that we have a secure connection.  By engaging in this virtual visit, you consent to the provision of healthcare and authorize for your insurance to be billed (if applicable) for the services provided during this visit. Depending on your insurance coverage, you may receive a charge related to this service.  I need to obtain your verbal consent now. Are you willing to proceed with your visit today? Gina Ryan has provided verbal consent on 08/12/2024 for a virtual visit (video or telephone). Gina Lamp, FNP  Date: 08/12/2024 6:22 PM   Virtual Visit via Video Note   I, Gina Ryan, connected with  Gina Ryan  ([id number], 68) on 08/12/24 at  6:30 PM EDT by a video-enabled telemedicine application and verified that I am speaking with the correct person using two identifiers.  Location: Patient: Virtual Visit Location Patient:  Home Provider: Virtual Visit Location Provider: Home Office   I discussed the limitations of evaluation and management by telemedicine and the availability of in person appointments. The patient expressed understanding and agreed to proceed.    History of Present Illness: Gina Ryan is a 68 y.o. who identifies as a female who was assigned female at birth, and is being seen today for cough, sinus pressure, headache, and fever for 2 days. She took an in home covid and flu test that were negative. Sx worsening. No history of pneumonia. Gina Ryan  HPI: HPI  Problems:  Patient Active Problem List   Diagnosis Date Noted   RUQ pain 04/18/2024   Right hand pain 08/28/2023   TIA (transient ischemic attack) 09/22/2020   HLD (hyperlipidemia) 09/22/2020   Right sided sciatica 09/22/2020   Hand arthritis 09/22/2020   Stroke-like symptoms 07/26/2020   Osteopenia 10/05/2019   Vitamin D  deficiency 10/05/2019   Closed left ankle fracture 09/16/2019   Trigger thumb of left hand 07/29/2019   Arthritis of carpometacarpal [facility]) joint of left thumb 09/14/2018   Arthritis of carpometacarpal [facility]) joint of left thumb 09/14/2018   Eczema 08/17/2018   Encounter for well adult exam with abnormal findings 03/27/2018   Primary osteoarthritis of one knee, right 03/10/2017   Complication of gastric band procedure 09/20/2016   H/O gastric bypass 09/15/2016   Elevated parathyroid hormone 03/03/2014   Hypothyroidism 03/03/2014   Obesity 01/19/2011   S/P knee replacement 12/13/2007   HTN (hypertension) 09/05/2007  Gastroesophageal reflux disease 09/05/2007   Atypical chest pain 09/05/2007   Abnormal glucose 09/05/2007   OA (osteoarthritis) of knee 08/07/2007   TOTAL HYSTERECTOMY AND BILATERAL SALPINGOOPHERECTOMY, HX OF 08/07/2007   CHOLECYSTECTOMY, HX OF 08/07/2007    Allergies:  Allergies  Allergen Reactions   Iodinated Contrast Media Shortness Of Breath   Vicodin [Hydrocodone-Acetaminophen ] Shortness Of  Breath    REACTION: Shortness of Breath    Hydrocodone-Acetaminophen      REACTION: Shortness of Breath   Other    Oxycodone -Acetaminophen      SOB, sweating, and panicking   Valsartan     Eye redness. No current issues with losartan  Eye redness. No current issues with losartan    Medications:  Current Outpatient Medications:    aspirin  EC 81 MG tablet, Take 1 tablet (81 mg total) by mouth daily. Swallow whole., Disp: , Rfl:    famotidine  (PEPCID ) 20 MG tablet, Take 1 tablet (20 mg total) by mouth 2 (two) times daily., Disp: 180 tablet, Rfl: 3   levothyroxine  (SYNTHROID ) 25 MCG tablet, Take 1 tablet (25 mcg total) by mouth daily., Disp: 90 tablet, Rfl: 3   losartan -hydrochlorothiazide  (HYZAAR) 50-12.5 MG tablet, Take 1 tablet by mouth daily., Disp: 90 tablet, Rfl: 3   meloxicam  (MOBIC ) 15 MG tablet, Take 1 tablet (15 mg total) by mouth daily as needed for pain., Disp: 90 tablet, Rfl: 3   pantoprazole  (PROTONIX ) 40 MG tablet, Take 1 tablet (40 mg total) by mouth 2 (two) times daily., Disp: 180 tablet, Rfl: 3   rosuvastatin  (CRESTOR ) 20 MG tablet, Take 1 tablet (20 mg total) by mouth daily., Disp: 90 tablet, Rfl: 3   Semaglutide ,0.25 or 0.5MG /DOS, (OZEMPIC , 0.25 OR 0.5 MG/DOSE,) 2 MG/3ML SOPN, Take 0.25 mg subcutaneous once weekly, Disp: 3 mL, Rfl: 3   traMADol  (ULTRAM ) 50 MG tablet, TAKE 1 TABLET(50 MG) BY MOUTH EVERY 6 HOURS AS NEEDED, Disp: 30 tablet, Rfl: 2  Observations/Objective: Patient is well-developed, well-nourished in no acute distress.  Resting comfortably  at home.  Head is normocephalic, atraumatic.  No labored breathing.  Speech is clear and coherent with logical content.  Patient is alert and oriented at baseline.    Assessment and Plan: There are no diagnoses linked to this encounter. Increase fluids humidifier at night, discussed viral illnesses, hold atb 2-3 days and start if sx persist or worsen, repeat covid and flu test tomorrow. UC if sx worsen.   Follow Up  Instructions: I discussed the assessment and treatment plan with the patient. The patient was provided an opportunity to ask questions and all were answered. The patient agreed with the plan and demonstrated an understanding of the instructions.  A copy of instructions were sent to the patient via MyChart unless otherwise noted below.     The patient was advised to call back or seek an in-person evaluation if the symptoms worsen or if the condition fails to improve as anticipated.    Laurance Heide, FNP

## 2024-08-12 NOTE — Patient Instructions (Signed)

## 2024-08-14 ENCOUNTER — Other Ambulatory Visit (HOSPITAL_COMMUNITY): Payer: Self-pay

## 2024-10-22 ENCOUNTER — Ambulatory Visit: Admitting: Internal Medicine

## 2024-10-22 ENCOUNTER — Ambulatory Visit

## 2024-11-07 ENCOUNTER — Ambulatory Visit: Payer: Self-pay | Admitting: Internal Medicine

## 2024-11-07 ENCOUNTER — Encounter: Payer: Self-pay | Admitting: Internal Medicine

## 2024-11-07 ENCOUNTER — Ambulatory Visit (INDEPENDENT_AMBULATORY_CARE_PROVIDER_SITE_OTHER)

## 2024-11-07 ENCOUNTER — Ambulatory Visit: Admitting: Internal Medicine

## 2024-11-07 VITALS — BP 130/80 | HR 67 | Ht 64.5 in | Wt 255.8 lb

## 2024-11-07 VITALS — BP 130/80 | HR 67 | Temp 98.3°F | Ht 64.5 in | Wt 255.0 lb

## 2024-11-07 DIAGNOSIS — E039 Hypothyroidism, unspecified: Secondary | ICD-10-CM | POA: Diagnosis not present

## 2024-11-07 DIAGNOSIS — Z0000 Encounter for general adult medical examination without abnormal findings: Secondary | ICD-10-CM | POA: Diagnosis not present

## 2024-11-07 DIAGNOSIS — E7849 Other hyperlipidemia: Secondary | ICD-10-CM | POA: Diagnosis not present

## 2024-11-07 DIAGNOSIS — G459 Transient cerebral ischemic attack, unspecified: Secondary | ICD-10-CM | POA: Diagnosis not present

## 2024-11-07 DIAGNOSIS — Z9884 Bariatric surgery status: Secondary | ICD-10-CM

## 2024-11-07 DIAGNOSIS — R1011 Right upper quadrant pain: Secondary | ICD-10-CM | POA: Diagnosis not present

## 2024-11-07 DIAGNOSIS — E66813 Obesity, class 3: Secondary | ICD-10-CM | POA: Diagnosis not present

## 2024-11-07 DIAGNOSIS — Z6841 Body Mass Index (BMI) 40.0 and over, adult: Secondary | ICD-10-CM | POA: Diagnosis not present

## 2024-11-07 DIAGNOSIS — R7309 Other abnormal glucose: Secondary | ICD-10-CM | POA: Diagnosis not present

## 2024-11-07 DIAGNOSIS — I10 Essential (primary) hypertension: Secondary | ICD-10-CM | POA: Diagnosis not present

## 2024-11-07 DIAGNOSIS — E559 Vitamin D deficiency, unspecified: Secondary | ICD-10-CM | POA: Diagnosis not present

## 2024-11-07 LAB — CBC WITH DIFFERENTIAL/PLATELET
Basophils Absolute: 0 K/uL (ref 0.0–0.1)
Basophils Relative: 0.7 % (ref 0.0–3.0)
Eosinophils Absolute: 0.1 K/uL (ref 0.0–0.7)
Eosinophils Relative: 2 % (ref 0.0–5.0)
HCT: 38 % (ref 36.0–46.0)
Hemoglobin: 12.2 g/dL (ref 12.0–15.0)
Lymphocytes Relative: 24.3 % (ref 12.0–46.0)
Lymphs Abs: 1.7 K/uL (ref 0.7–4.0)
MCHC: 32 g/dL (ref 30.0–36.0)
MCV: 82 fl (ref 78.0–100.0)
Monocytes Absolute: 0.5 K/uL (ref 0.1–1.0)
Monocytes Relative: 7.1 % (ref 3.0–12.0)
Neutro Abs: 4.5 K/uL (ref 1.4–7.7)
Neutrophils Relative %: 65.9 % (ref 43.0–77.0)
Platelets: 267 K/uL (ref 150.0–400.0)
RBC: 4.64 Mil/uL (ref 3.87–5.11)
RDW: 15.4 % (ref 11.5–15.5)
WBC: 6.9 K/uL (ref 4.0–10.5)

## 2024-11-07 LAB — BASIC METABOLIC PANEL WITH GFR
BUN: 15 mg/dL (ref 6–23)
CO2: 28 meq/L (ref 19–32)
Calcium: 8.8 mg/dL (ref 8.4–10.5)
Chloride: 103 meq/L (ref 96–112)
Creatinine, Ser: 0.6 mg/dL (ref 0.40–1.20)
GFR: 91.87 mL/min
Glucose, Bld: 103 mg/dL — ABNORMAL HIGH (ref 70–99)
Potassium: 4.1 meq/L (ref 3.5–5.1)
Sodium: 139 meq/L (ref 135–145)

## 2024-11-07 LAB — HEPATIC FUNCTION PANEL
ALT: 35 U/L (ref 3–35)
AST: 45 U/L — ABNORMAL HIGH (ref 5–37)
Albumin: 4 g/dL (ref 3.5–5.2)
Alkaline Phosphatase: 161 U/L — ABNORMAL HIGH (ref 39–117)
Bilirubin, Direct: 0 mg/dL — ABNORMAL LOW (ref 0.1–0.3)
Total Bilirubin: 0.3 mg/dL (ref 0.2–1.2)
Total Protein: 7.3 g/dL (ref 6.0–8.3)

## 2024-11-07 LAB — VITAMIN D 25 HYDROXY (VIT D DEFICIENCY, FRACTURES): VITD: 16.12 ng/mL — ABNORMAL LOW (ref 30.00–100.00)

## 2024-11-07 LAB — LIPID PANEL
Cholesterol: 170 mg/dL (ref 28–200)
HDL: 109.4 mg/dL
LDL Cholesterol: 53 mg/dL (ref 10–99)
NonHDL: 61.04
Total CHOL/HDL Ratio: 2
Triglycerides: 42 mg/dL (ref 10.0–149.0)
VLDL: 8.4 mg/dL (ref 0.0–40.0)

## 2024-11-07 LAB — HEMOGLOBIN A1C: Hgb A1c MFr Bld: 6.1 % (ref 4.6–6.5)

## 2024-11-07 MED ORDER — PHENTERMINE HCL 30 MG PO CAPS: 30.0000 mg | ORAL_CAPSULE | ORAL | 1 refills | Status: AC

## 2024-11-07 NOTE — Assessment & Plan Note (Signed)
 BP Readings from Last 3 Encounters:  11/07/24 130/80  11/07/24 130/80  04/18/24 118/72   Stable, pt to continue medical treatment hyzaar 50 12.5 qd

## 2024-11-07 NOTE — Assessment & Plan Note (Signed)
 Last vitamin D  Lab Results  Component Value Date   VD25OH 16.12 (L) 11/07/2024   Low, to start oral replacement

## 2024-11-07 NOTE — Assessment & Plan Note (Signed)
For bariatric referral

## 2024-11-07 NOTE — Assessment & Plan Note (Signed)
 Lab Results  Component Value Date   HGBA1C 6.1 11/07/2024   Stable, pt to continue current medical treatment ozempic  0.25 mg qd

## 2024-11-07 NOTE — Assessment & Plan Note (Signed)
 Lab Results  Component Value Date   LDLCALC 53 11/07/2024   Stable, pt to continue current statin crestor  20 mg qd

## 2024-11-07 NOTE — Assessment & Plan Note (Signed)
 Pt to continue asa 81 mg

## 2024-11-07 NOTE — Patient Instructions (Signed)
 Please take all new medication as prescribed- the phentermine   Please continue all other medications as before, and refills have been done if requested.  Please have the pharmacy call with any other refills you may need.  Please continue your efforts at being more active, low cholesterol diet, and weight control.  You are otherwise up to date with prevention measures today.  Please keep your appointments with your specialists as you may have planned  You will be contacted regarding the referral for: General Surgury - Bariatric  Please go to the LAB at the blood drawing area for the tests to be done  You will be contacted by phone if any changes need to be made immediately.  Otherwise, you will receive a letter about your results with an explanation, but please check with MyChart first.  Please make an Appointment to return in 6 months, or sooner if needed

## 2024-11-07 NOTE — Assessment & Plan Note (Signed)
 Ok for phentermine  30 mg limited rx

## 2024-11-07 NOTE — Progress Notes (Signed)
 Patient ID: Gina Ryan, female   DOB: Jun 19, 1956, 69 y.o.   MRN: [id number]        Chief Complaint: follow up HTN, HLD low vit d, low thyroid , hyperglycemia       HPI:  Gina Ryan is a 69 y.o. female here overall doing ok, but has recurring RUQ pain likely post gastric bypass related, has not yet seen bariatric surgury for follow up.  Tolerating new crestor  and aspirin  since last visit.  Pt denies chest pain, increased sob or doe, wheezing, orthopnea, PND, increased LE swelling, palpitations, dizziness or syncope.   Pt denies polydipsia, polyuria, or new focal neuro s/s.   Unable to lose significant wt with diet exercise.         Wt Readings from Last 3 Encounters:  11/07/24 255 lb (115.7 kg)  11/07/24 255 lb 12.8 oz (116 kg)  04/18/24 218 lb (98.9 kg)   BP Readings from Last 3 Encounters:  11/07/24 130/80  11/07/24 130/80  04/18/24 118/72         Past Medical History:  Diagnosis Date   Atypical chest pain    Blood transfusion without reported diagnosis    GERD (gastroesophageal reflux disease)    Hypertension    Morbid obesity (HCC)    Osteoarthritis, knee    Thyroid  disease    TIA (transient ischemic attack) 09/22/2020   Past Surgical History:  Procedure Laterality Date   CHOLECYSTECTOMY     COSMETIC SURGERY  07/2010   (lower facelift, lower blepharoplasty)   GASTRIC BYPASS     LAPAROSCOPIC GASTRIC BANDING     ROTATOR CUFF REPAIR Bilateral    TOTAL ABDOMINAL HYSTERECTOMY     TOTAL KNEE ARTHROPLASTY Left 10/28/2016   Procedure: LEFT TOTAL KNEE ARTHROPLASTY;  Surgeon: Lamar Collet, MD;  Location: [facility];  Service: Orthopedics;  Laterality: Left;   TOTAL KNEE ARTHROPLASTY Right 03/10/2017   Procedure: RIGHT TOTAL KNEE ARTHROPLASTY;  Surgeon: Collet Lamar, MD;  Location: [facility];  Service: Orthopedics;  Laterality: Right;    reports that she has never smoked. She has never used smokeless tobacco. She reports that she does not drink alcohol and does not use  drugs. family history includes Cancer in her brother; Colon cancer in her maternal uncle; Colon cancer (age of onset: 27) in her brother; Hypertension in her mother; Prostate cancer in her brother; Stroke in her mother. Allergies[1] Medications Ordered Prior to Encounter[2]      ROS:  All others reviewed and negative.  Objective        PE:  BP 130/80 (BP Location: Right Arm, Patient Position: Sitting, Cuff Size: Normal)   Pulse 67   Temp 98.3 F (36.8 C) (Oral)   Ht 5' 4.5 (1.638 m)   Wt 255 lb (115.7 kg)   SpO2 100%   BMI 43.09 kg/m                 Constitutional: Pt appears in NAD               HENT: Head: NCAT.                Right Ear: External ear normal.                 Left Ear: External ear normal.                Eyes: . Pupils are equal, round, and reactive to light. Conjunctivae and EOM are normal  Nose: without d/c or deformity               Neck: Neck supple. Gross normal ROM               Cardiovascular: Normal rate and regular rhythm.                 Pulmonary/Chest: Effort normal and breath sounds without rales or wheezing.                Abd:  Soft,mild ruq tender,, ND, + BS, no organomegaly               Neurological: Pt is alert. At baseline orientation, motor grossly intact               Skin: Skin is warm. No rashes, no other new lesions, LE edema - none               Psychiatric: Pt behavior is normal without agitation   Micro: none  Cardiac tracings I have personally interpreted today:  none  Pertinent Radiological findings (summarize): none   Lab Results  Component Value Date   WBC 6.9 11/07/2024   HGB 12.2 11/07/2024   HCT 38.0 11/07/2024   PLT 267.0 11/07/2024   GLUCOSE 103 (H) 11/07/2024   CHOL 170 11/07/2024   TRIG 42.0 11/07/2024   HDL 109.40 11/07/2024   LDLDIRECT 99.0 12/15/2008   LDLCALC 53 11/07/2024   ALT 35 11/07/2024   AST 45 (H) 11/07/2024   NA 139 11/07/2024   K 4.1 11/07/2024   CL 103 11/07/2024   CREATININE  0.60 11/07/2024   BUN 15 11/07/2024   CO2 28 11/07/2024   TSH 0.51 04/18/2024   INR 0.95 03/03/2017   HGBA1C 6.1 11/07/2024   Assessment/Plan:  Gina Ryan is a 69 y.o. Black or African American [2] female with  has a past medical history of Atypical chest pain, Blood transfusion without reported diagnosis, GERD (gastroesophageal reflux disease), Hypertension, Morbid obesity (HCC), Osteoarthritis, knee, Thyroid  disease, and TIA (transient ischemic attack) (09/22/2020).  Vitamin D  deficiency Last vitamin D  Lab Results  Component Value Date   VD25OH 16.12 (L) 11/07/2024   Low, to start oral replacement   Hypothyroidism Lab Results  Component Value Date   TSH 0.51 04/18/2024   Stable, pt to continue levothyroxine  25 mcg qd   HTN (hypertension) BP Readings from Last 3 Encounters:  11/07/24 130/80  11/07/24 130/80  04/18/24 118/72   Stable, pt to continue medical treatment hyzaar 50 12.5 qd   HLD (hyperlipidemia) Lab Results  Component Value Date   LDLCALC 53 11/07/2024   Stable, pt to continue current statin crestor  20 mg qd   H/O gastric bypass With recurring pain, for refer General Surgury - Bariatric  Abnormal glucose Lab Results  Component Value Date   HGBA1C 6.1 11/07/2024   Stable, pt to continue current medical treatment ozempic  0.25 mg qd   RUQ pain For bariatric referral  Obesity Ok for phentermine  30 mg limited rx  TIA (transient ischemic attack) Pt to continue asa 81 mg  Followup: Return in about 6 months (around 05/07/2025).  Gina Rush, MD 11/07/2024 8:26 PM [facility]     [1]  Allergies Allergen Reactions   Iodinated Contrast Media Shortness Of Breath   Vicodin [Hydrocodone-Acetaminophen ] Shortness Of Breath    REACTION: Shortness of Breath    Hydrocodone-Acetaminophen      REACTION:  Shortness of Breath   Other    Oxycodone -Acetaminophen      SOB, sweating,  and panicking  [2]  Current Outpatient Medications on File Prior to Visit  Medication Sig Dispense Refill   aspirin  EC 81 MG tablet Take 1 tablet (81 mg total) by mouth daily. Swallow whole.     famotidine  (PEPCID ) 20 MG tablet Take 1 tablet (20 mg total) by mouth 2 (two) times daily. 180 tablet 3   levothyroxine  (SYNTHROID ) 25 MCG tablet Take 1 tablet (25 mcg total) by mouth daily. 90 tablet 3   losartan -hydrochlorothiazide  (HYZAAR) 50-12.5 MG tablet Take 1 tablet by mouth daily. 90 tablet 3   meloxicam  (MOBIC ) 15 MG tablet Take 1 tablet (15 mg total) by mouth daily as needed for pain. (Patient not taking: Reported on 11/07/2024) 90 tablet 3   pantoprazole  (PROTONIX ) 40 MG tablet Take 1 tablet (40 mg total) by mouth 2 (two) times daily. (Patient not taking: Reported on 11/07/2024) 180 tablet 3   rosuvastatin  (CRESTOR ) 20 MG tablet Take 1 tablet (20 mg total) by mouth daily. 90 tablet 3   Semaglutide ,0.25 or 0.5MG /DOS, (OZEMPIC , 0.25 OR 0.5 MG/DOSE,) 2 MG/3ML SOPN Take 0.25 mg subcutaneous once weekly (Patient not taking: Reported on 11/07/2024) 3 mL 3   traMADol  (ULTRAM ) 50 MG tablet TAKE 1 TABLET(50 MG) BY MOUTH EVERY 6 HOURS AS NEEDED (Patient not taking: Reported on 11/07/2024) 30 tablet 2   [DISCONTINUED] potassium chloride  (K-DUR) 10 MEQ tablet Take 1 tablet (10 mEq total) by mouth daily. 90 tablet 0   No current facility-administered medications on file prior to visit.

## 2024-11-07 NOTE — Assessment & Plan Note (Signed)
 With recurring pain, for refer General Surgury - Bariatric

## 2024-11-07 NOTE — Patient Instructions (Addendum)
 Gina Ryan,  Thank you for taking the time for your Medicare Wellness Visit. I appreciate your continued commitment to your health goals. Please review the care plan we discussed, and feel free to reach out if I can assist you further.  Please note that Annual Wellness Visits do not include a physical exam. Some assessments may be limited, especially if the visit was conducted virtually. If needed, we may recommend an in-person follow-up with your provider.  Ongoing Care Seeing your primary care provider every 3 to 6 months helps us  monitor your health and provide consistent, personalized care. Office visit is today with Dr. Norleen.  Keep up the good work.  Referrals If a referral was made during today's visit and you haven't received any updates within two weeks, please contact the referred provider directly to check on the status. You have an order for:  [x]   3D Mammogram    Please call for appointment:  [facility] [address] [place], [place] [address] [phone]  Make sure to wear two-piece clothing.  No lotions, powders, or deodorants the day of the appointment. Make sure to bring picture ID and insurance card.  Bring list of medications you are currently taking including any supplements.    Recommended Screenings:  Health Maintenance  Topic Date Due   Zoster (Shingles) Vaccine (1 of 2) Never done   Breast Cancer Screening  07/09/2022   Flu Shot  05/31/2024   Medicare Annual Wellness Visit  11/07/2025   DTaP/Tdap/Td vaccine (3 - Td or Tdap) 03/27/2028   Colon Cancer Screening  06/25/2028   Pneumococcal Vaccine for age over 66  Completed   Osteoporosis screening with Bone Density Scan  Completed   Hepatitis C Screening  Completed   Meningitis B Vaccine  Aged Out   COVID-19 Vaccine  Discontinued       10/31/2024   11:56 PM  Advanced Directives  Does Patient Have a Medical Advance Directive? No    Vision: Annual vision screenings are  recommended for early detection of glaucoma, cataracts, and diabetic retinopathy. These exams can also reveal signs of chronic conditions such as diabetes and high blood pressure.  Dental: Annual dental screenings help detect early signs of oral cancer, gum disease, and other conditions linked to overall health, including heart disease and diabetes.  Please see the attached documents for additional preventive care recommendations.

## 2024-11-07 NOTE — Progress Notes (Signed)
 "  Chief Complaint  Patient presents with   Medicare Wellness     Subjective:   Gina Ryan is a 69 y.o. female who presents for a [employer] Visit.  Visit info / Clinical Intake: Medicare Wellness Visit Type:: Subsequent Annual Wellness Visit Persons participating in visit and providing information:: patient Medicare Wellness Visit Mode:: In-person (required for WTM) Interpreter Needed?: No Pre-visit prep was completed: yes AWV questionnaire completed by patient prior to visit?: yes Date:: 10/31/24 Living arrangements:: (!) (Patient-Rptd) lives alone Patient's Overall Health Status Rating: (Patient-Rptd) good Typical amount of pain: (Patient-Rptd) none Does pain affect daily life?: (Patient-Rptd) no Are you currently prescribed opioids?: no  Dietary Habits and Nutritional Risks How many meals a day?: (Patient-Rptd) 3 Eats fruit and vegetables daily?: (Patient-Rptd) yes Most meals are obtained by: (Patient-Rptd) preparing own meals In the last 2 weeks, have you had any of the following?: none Diabetic:: no  Functional Status Activities of Daily Living (to include ambulation/medication): (Patient-Rptd) Independent Ambulation: Independent with device- listed below Home Assistive Devices/Equipment: Eyeglasses Medication Administration: (Patient-Rptd) Independent Home Management (perform basic housework or laundry): (Patient-Rptd) Independent Manage your own finances?: (Patient-Rptd) yes Primary transportation is: (Patient-Rptd) driving Concerns about vision?: no *vision screening is required for WTM* Concerns about hearing?: (!) yes (noticed a change in hearing) Uses hearing aids?: no Hear whispered voice?: yes  Fall Screening Falls in the past year?: (Patient-Rptd) 0 Number of falls in past year: 0 Was there an injury with Fall?: 0 Fall Risk Category Calculator: 0 Patient Fall Risk Level: Low Fall Risk  Fall Risk Patient at Risk for Falls Due to: No  Fall Risks Fall risk Follow up: Falls evaluation completed; Falls prevention discussed  Home and Transportation Safety: All rugs have non-skid backing?: (!) (Patient-Rptd) no All stairs or steps have railings?: (Patient-Rptd) N/A, no stairs Grab bars in the bathtub or shower?: (!) (Patient-Rptd) no Have non-skid surface in bathtub or shower?: (Patient-Rptd) yes Good home lighting?: (Patient-Rptd) yes Regular seat belt use?: (Patient-Rptd) yes Hospital stays in the last year:: (Patient-Rptd) no  Cognitive Assessment Difficulty concentrating, remembering, or making decisions? : (Patient-Rptd) no Will 6CIT or Mini Cog be Completed: no 6CIT or Mini Cog Declined: patient alert, oriented, able to answer questions appropriately and recall recent events  Advance Directives (For Healthcare) Does Patient Have a Medical Advance Directive?: No  Reviewed/Updated  Reviewed/Updated: Reviewed All (Medical, Surgical, Family, Medications, Allergies, Care Teams, Patient Goals)    Allergies (verified) Iodinated contrast media, Vicodin [hydrocodone-acetaminophen ], Hydrocodone-acetaminophen , Other, Oxycodone -acetaminophen , and Valsartan   Current Medications (verified) Outpatient Encounter Medications as of 11/07/2024  Medication Sig   aspirin  EC 81 MG tablet Take 1 tablet (81 mg total) by mouth daily. Swallow whole.   famotidine  (PEPCID ) 20 MG tablet Take 1 tablet (20 mg total) by mouth 2 (two) times daily.   levothyroxine  (SYNTHROID ) 25 MCG tablet Take 1 tablet (25 mcg total) by mouth daily.   losartan -hydrochlorothiazide  (HYZAAR) 50-12.5 MG tablet Take 1 tablet by mouth daily.   rosuvastatin  (CRESTOR ) 20 MG tablet Take 1 tablet (20 mg total) by mouth daily.   meloxicam  (MOBIC ) 15 MG tablet Take 1 tablet (15 mg total) by mouth daily as needed for pain. (Patient not taking: Reported on 11/07/2024)   pantoprazole  (PROTONIX ) 40 MG tablet Take 1 tablet (40 mg total) by mouth 2 (two) times daily. (Patient  not taking: Reported on 11/07/2024)   Semaglutide ,0.25 or 0.5MG /DOS, (OZEMPIC , 0.25 OR 0.5 MG/DOSE,) 2 MG/3ML SOPN Take 0.25 mg subcutaneous  once weekly (Patient not taking: Reported on 11/07/2024)   traMADol  (ULTRAM ) 50 MG tablet TAKE 1 TABLET(50 MG) BY MOUTH EVERY 6 HOURS AS NEEDED (Patient not taking: Reported on 11/07/2024)   [DISCONTINUED] potassium chloride  (K-DUR) 10 MEQ tablet Take 1 tablet (10 mEq total) by mouth daily.   No facility-administered encounter medications on file as of 11/07/2024.    History: Past Medical History:  Diagnosis Date   Atypical chest pain    Blood transfusion without reported diagnosis    GERD (gastroesophageal reflux disease)    Hypertension    Morbid obesity (HCC)    Osteoarthritis, knee    Thyroid  disease    TIA (transient ischemic attack) 09/22/2020   Past Surgical History:  Procedure Laterality Date   CHOLECYSTECTOMY     COSMETIC SURGERY  07/2010   (lower facelift, lower blepharoplasty)   GASTRIC BYPASS     LAPAROSCOPIC GASTRIC BANDING     ROTATOR CUFF REPAIR Bilateral    TOTAL ABDOMINAL HYSTERECTOMY     TOTAL KNEE ARTHROPLASTY Left 10/28/2016   Procedure: LEFT TOTAL KNEE ARTHROPLASTY;  Surgeon: Lamar Collet, MD;  Location: [facility];  Service: Orthopedics;  Laterality: Left;   TOTAL KNEE ARTHROPLASTY Right 03/10/2017   Procedure: RIGHT TOTAL KNEE ARTHROPLASTY;  Surgeon: Collet Lamar, MD;  Location: [facility];  Service: Orthopedics;  Laterality: Right;   Family History  Problem Relation Age of Onset   Stroke Mother    Hypertension Mother    Cancer Brother        diagnosed in late 49's   Colon cancer Brother 1   Prostate cancer Brother    Colon cancer Maternal Uncle    Esophageal cancer Neg Hx    Rectal cancer Neg Hx    Stomach cancer Neg Hx    Social History   Occupational History   Occupation: RETIRED/Childcare    Employer: [employer]  Tobacco Use   Smoking status: Never   Smokeless tobacco: Never  Vaping Use    Vaping status: Never Used  Substance and Sexual Activity   Alcohol use: No   Drug use: No   Sexual activity: Not Currently   Tobacco Counseling Counseling given: Not Answered  SDOH Screenings   Food Insecurity: No Food Insecurity (10/31/2024)  Housing: Low Risk (10/31/2024)  Transportation Needs: No Transportation Needs (10/31/2024)  Utilities: Not At Risk (11/07/2024)  Alcohol Screen: Low Risk (10/31/2024)  Depression (PHQ2-9): Low Risk (11/07/2024)  Financial Resource Strain: High Risk (10/31/2024)  Physical Activity: Sufficiently Active (10/31/2024)  Social Connections: Moderately Isolated (10/31/2024)  Stress: No Stress Concern Present (10/31/2024)  Tobacco Use: Low Risk (11/07/2024)  Health Literacy: Adequate Health Literacy (11/07/2024)   See flowsheets for full screening details  Depression Screen PHQ 2 & 9 Depression Scale- Over the past 2 weeks, how often have you been bothered by any of the following problems? Little interest or pleasure in doing things: 0 Feeling down, depressed, or hopeless (PHQ Adolescent also includes...irritable): 0 PHQ-2 Total Score: 0 Trouble falling or staying asleep, or sleeping too much: 0 Feeling tired or having little energy: 0 Poor appetite or overeating (PHQ Adolescent also includes...weight loss): 0 Feeling bad about yourself - or that you are a failure or have let yourself or your family down: 0 Trouble concentrating on things, such as reading the newspaper or watching television (PHQ Adolescent also includes...like school work): 0 Moving or speaking so slowly that other people could have noticed. Or the opposite - being so fidgety or restless  that you have been moving around a lot more than usual: 0 Thoughts that you would be better off dead, or of hurting yourself in some way: 0 PHQ-9 Total Score: 0 If you checked off any problems, how difficult have these problems made it for you to do your work, take care of things at home, or get along with other  people?: Not difficult at all  Depression Treatment Depression Interventions/Treatment : EYV7-0 Score <4 Follow-up Not Indicated     Goals Addressed             This Visit's Progress    Weight (lb) < 200 lb (90.7 kg)   255 lb 12.8 oz (116 kg)    Would like to get down to 175 lb/2026             Objective:    Today's Vitals   11/07/24 0930  BP: 130/80  Pulse: 67  SpO2: 100%  Weight: 255 lb 12.8 oz (116 kg)  Height: 5' 4.5 (1.638 m)   Body mass index is 43.23 kg/m.  Hearing/Vision screen Hearing Screening - Comments:: Noticed some hearing issues Vision Screening - Comments:: Has eyeglasses but does not wear them/[facility]/UTD Immunizations and Health Maintenance Health Maintenance  Topic Date Due   Zoster Vaccines- Shingrix (1 of 2) Never done   Mammogram  07/09/2022   Medicare Annual Wellness (AWV)  11/07/2025   DTaP/Tdap/Td (3 - Td or Tdap) 03/27/2028   Colonoscopy  06/25/2028   Pneumococcal Vaccine: 50+ Years  Completed   Influenza Vaccine  Completed   Bone Density Scan  Completed   Hepatitis C Screening  Completed   Meningococcal B Vaccine  Aged Out   COVID-19 Vaccine  Discontinued        Assessment/Plan:  This is a routine wellness examination for Bellany.  Patient Care Team: Norleen Lynwood ORN, MD as PCP - General (Internal Medicine)  I have personally reviewed and noted the following in the patients chart:   Medical and social history Use of alcohol, tobacco or illicit drugs  Current medications and supplements including opioid prescriptions. Functional ability and status Nutritional status Physical activity Advanced directives List of other physicians Hospitalizations, surgeries, and ER visits in previous 12 months Vitals Screenings to include cognitive, depression, and falls Referrals and appointments  No orders of the defined types were placed in this encounter.  In addition, I have reviewed and discussed with patient  certain preventive protocols, quality metrics, and best practice recommendations. A written personalized care plan for preventive services as well as general preventive health recommendations were provided to patient.   Akia Desroches L Roshawn Ayala, CMA   11/07/2024   Return in 1 year (on 11/07/2025).  After Visit Summary: (MyChart) Due to this being a telephonic visit, the after visit summary with patients personalized plan was offered to patient via MyChart   Nurse Notes: Patient is due for a mammogram and order has been placed in June.  Patient stated that she called to get scheduled and was told that she had to get records from where she got her last mammogram from.  Patient also stated that she is unable to get any records from where she had it done last-Please advise.   "

## 2024-11-07 NOTE — Assessment & Plan Note (Signed)
 Lab Results  Component Value Date   TSH 0.51 04/18/2024   Stable, pt to continue levothyroxine  25 mcg qd
